# Patient Record
Sex: Female | Born: 1935
Health system: Southern US, Community
[De-identification: ages and names within clinical notes are randomized; demographics above are authoritative.]

## PROBLEM LIST (undated history)

## (undated) DIAGNOSIS — K589 Irritable bowel syndrome without diarrhea: Secondary | ICD-10-CM

## (undated) DIAGNOSIS — M1990 Unspecified osteoarthritis, unspecified site: Secondary | ICD-10-CM

## (undated) DIAGNOSIS — I2510 Atherosclerotic heart disease of native coronary artery without angina pectoris: Secondary | ICD-10-CM

## (undated) DIAGNOSIS — I10 Essential (primary) hypertension: Secondary | ICD-10-CM

## (undated) DIAGNOSIS — I639 Cerebral infarction, unspecified: Secondary | ICD-10-CM

## (undated) DIAGNOSIS — K219 Gastro-esophageal reflux disease without esophagitis: Secondary | ICD-10-CM

## (undated) DIAGNOSIS — E785 Hyperlipidemia, unspecified: Secondary | ICD-10-CM

## (undated) DIAGNOSIS — Z170 Estrogen receptor positive status [ER+]: Secondary | ICD-10-CM

## (undated) DIAGNOSIS — F32A Depression, unspecified: Secondary | ICD-10-CM

## (undated) DIAGNOSIS — C50412 Malignant neoplasm of upper-outer quadrant of left female breast: Secondary | ICD-10-CM

## (undated) DIAGNOSIS — R4182 Altered mental status, unspecified: Secondary | ICD-10-CM

## (undated) DIAGNOSIS — F329 Major depressive disorder, single episode, unspecified: Secondary | ICD-10-CM

## (undated) DIAGNOSIS — I252 Old myocardial infarction: Secondary | ICD-10-CM

## (undated) DIAGNOSIS — C44111 Basal cell carcinoma of skin of unspecified eyelid, including canthus: Secondary | ICD-10-CM

## (undated) DIAGNOSIS — D649 Anemia, unspecified: Secondary | ICD-10-CM

## (undated) DIAGNOSIS — K5790 Diverticulosis of intestine, part unspecified, without perforation or abscess without bleeding: Secondary | ICD-10-CM

## (undated) DIAGNOSIS — C50919 Malignant neoplasm of unspecified site of unspecified female breast: Secondary | ICD-10-CM

## (undated) HISTORY — PX: ABDOMINAL HYSTERECTOMY: SHX81

## (undated) HISTORY — DX: Old myocardial infarction: I25.2

## (undated) HISTORY — DX: Malignant neoplasm of upper-outer quadrant of left female breast: C50.412

## (undated) HISTORY — PX: APPENDECTOMY: SHX54

## (undated) HISTORY — PX: BACK SURGERY: SHX140

## (undated) HISTORY — DX: Malignant neoplasm of unspecified site of unspecified female breast: C50.919

## (undated) HISTORY — DX: Basal cell carcinoma of skin of unspecified eyelid, including canthus: C44.111

## (undated) HISTORY — PX: COLONOSCOPY: SHX5424

## (undated) HISTORY — DX: Anemia, unspecified: D64.9

## (undated) HISTORY — DX: Hyperlipidemia, unspecified: E78.5

## (undated) HISTORY — DX: Altered mental status, unspecified: R41.82

## (undated) HISTORY — DX: Estrogen receptor positive status (ER+): Z17.0

## (undated) HISTORY — PX: OTHER SURGICAL HISTORY: SHX169

---

## 1997-12-07 ENCOUNTER — Ambulatory Visit (HOSPITAL_COMMUNITY): Admission: RE | Admit: 1997-12-07 | Discharge: 1997-12-07 | Payer: Self-pay | Admitting: *Deleted

## 1998-06-28 ENCOUNTER — Encounter: Payer: Self-pay | Admitting: *Deleted

## 1998-06-28 ENCOUNTER — Ambulatory Visit (HOSPITAL_COMMUNITY): Admission: RE | Admit: 1998-06-28 | Discharge: 1998-06-28 | Payer: Self-pay | Admitting: *Deleted

## 1998-10-19 ENCOUNTER — Other Ambulatory Visit: Admission: RE | Admit: 1998-10-19 | Discharge: 1998-10-19 | Payer: Self-pay | Admitting: Family Medicine

## 1998-11-07 ENCOUNTER — Encounter: Admission: RE | Admit: 1998-11-07 | Discharge: 1999-02-05 | Payer: Self-pay | Admitting: Family Medicine

## 2000-01-08 ENCOUNTER — Encounter: Payer: Self-pay | Admitting: Neurological Surgery

## 2000-01-08 ENCOUNTER — Encounter: Admission: RE | Admit: 2000-01-08 | Discharge: 2000-01-08 | Payer: Self-pay | Admitting: Neurological Surgery

## 2000-04-23 ENCOUNTER — Other Ambulatory Visit: Admission: RE | Admit: 2000-04-23 | Discharge: 2000-04-23 | Payer: Self-pay | Admitting: *Deleted

## 2000-04-30 ENCOUNTER — Ambulatory Visit (HOSPITAL_COMMUNITY): Admission: RE | Admit: 2000-04-30 | Discharge: 2000-04-30 | Payer: Self-pay | Admitting: Gastroenterology

## 2000-04-30 ENCOUNTER — Encounter (INDEPENDENT_AMBULATORY_CARE_PROVIDER_SITE_OTHER): Payer: Self-pay

## 2000-08-21 ENCOUNTER — Emergency Department (HOSPITAL_COMMUNITY): Admission: EM | Admit: 2000-08-21 | Discharge: 2000-08-21 | Payer: Self-pay | Admitting: *Deleted

## 2001-08-09 ENCOUNTER — Encounter: Payer: Self-pay | Admitting: Emergency Medicine

## 2001-08-09 ENCOUNTER — Emergency Department (HOSPITAL_COMMUNITY): Admission: EM | Admit: 2001-08-09 | Discharge: 2001-08-09 | Payer: Self-pay | Admitting: Emergency Medicine

## 2001-08-15 ENCOUNTER — Encounter: Payer: Self-pay | Admitting: Gastroenterology

## 2001-08-15 ENCOUNTER — Ambulatory Visit (HOSPITAL_COMMUNITY): Admission: RE | Admit: 2001-08-15 | Discharge: 2001-08-15 | Payer: Self-pay | Admitting: Gastroenterology

## 2002-03-10 ENCOUNTER — Emergency Department (HOSPITAL_COMMUNITY): Admission: EM | Admit: 2002-03-10 | Discharge: 2002-03-10 | Payer: Self-pay | Admitting: Emergency Medicine

## 2002-11-19 ENCOUNTER — Emergency Department (HOSPITAL_COMMUNITY): Admission: EM | Admit: 2002-11-19 | Discharge: 2002-11-19 | Payer: Self-pay

## 2002-11-19 ENCOUNTER — Encounter: Payer: Self-pay | Admitting: Emergency Medicine

## 2003-12-24 ENCOUNTER — Emergency Department (HOSPITAL_COMMUNITY): Admission: EM | Admit: 2003-12-24 | Discharge: 2003-12-24 | Payer: Self-pay | Admitting: Emergency Medicine

## 2004-07-17 ENCOUNTER — Ambulatory Visit: Payer: Self-pay | Admitting: Physical Medicine & Rehabilitation

## 2004-07-17 ENCOUNTER — Inpatient Hospital Stay (HOSPITAL_COMMUNITY): Admission: EM | Admit: 2004-07-17 | Discharge: 2004-07-21 | Payer: Self-pay | Admitting: Emergency Medicine

## 2004-07-17 ENCOUNTER — Ambulatory Visit: Payer: Self-pay | Admitting: Cardiology

## 2004-07-21 ENCOUNTER — Inpatient Hospital Stay (HOSPITAL_COMMUNITY)
Admission: RE | Admit: 2004-07-21 | Discharge: 2004-07-27 | Payer: Self-pay | Admitting: Physical Medicine & Rehabilitation

## 2004-07-28 ENCOUNTER — Encounter
Admission: RE | Admit: 2004-07-28 | Discharge: 2004-10-26 | Payer: Self-pay | Admitting: Physical Medicine & Rehabilitation

## 2004-08-24 ENCOUNTER — Ambulatory Visit: Payer: Self-pay | Admitting: Cardiology

## 2004-08-25 ENCOUNTER — Encounter
Admission: RE | Admit: 2004-08-25 | Discharge: 2004-11-23 | Payer: Self-pay | Admitting: Physical Medicine & Rehabilitation

## 2004-08-29 ENCOUNTER — Ambulatory Visit: Payer: Self-pay | Admitting: Physical Medicine & Rehabilitation

## 2004-09-18 ENCOUNTER — Ambulatory Visit: Payer: Self-pay | Admitting: Cardiology

## 2004-11-29 ENCOUNTER — Ambulatory Visit: Payer: Self-pay | Admitting: Cardiology

## 2005-01-19 ENCOUNTER — Ambulatory Visit: Payer: Self-pay | Admitting: Cardiovascular Disease

## 2005-03-22 ENCOUNTER — Ambulatory Visit: Payer: Self-pay | Admitting: Cardiology

## 2005-05-02 ENCOUNTER — Ambulatory Visit: Payer: Self-pay

## 2005-09-12 ENCOUNTER — Ambulatory Visit: Payer: Self-pay | Admitting: Cardiology

## 2005-09-20 ENCOUNTER — Ambulatory Visit: Payer: Self-pay

## 2005-11-21 ENCOUNTER — Ambulatory Visit: Payer: Self-pay | Admitting: Cardiology

## 2005-11-23 ENCOUNTER — Ambulatory Visit: Payer: Self-pay | Admitting: Cardiology

## 2005-12-05 ENCOUNTER — Ambulatory Visit: Payer: Self-pay | Admitting: Cardiology

## 2006-03-22 ENCOUNTER — Emergency Department (HOSPITAL_COMMUNITY): Admission: EM | Admit: 2006-03-22 | Discharge: 2006-03-22 | Payer: Self-pay | Admitting: Emergency Medicine

## 2006-07-22 ENCOUNTER — Ambulatory Visit: Payer: Self-pay | Admitting: Internal Medicine

## 2006-09-27 ENCOUNTER — Ambulatory Visit: Payer: Self-pay | Admitting: Cardiology

## 2006-10-02 ENCOUNTER — Ambulatory Visit: Payer: Self-pay

## 2007-07-28 ENCOUNTER — Ambulatory Visit: Payer: Self-pay | Admitting: Cardiology

## 2007-07-31 ENCOUNTER — Ambulatory Visit: Payer: Self-pay

## 2007-09-08 ENCOUNTER — Ambulatory Visit: Payer: Self-pay | Admitting: Cardiology

## 2007-09-08 LAB — CONVERTED CEMR LAB
AST: 19 units/L (ref 0–37)
Albumin: 3.4 g/dL — ABNORMAL LOW (ref 3.5–5.2)
Alkaline Phosphatase: 99 units/L (ref 39–117)
HDL: 60.9 mg/dL (ref >39.0)
Triglycerides: 45 mg/dL (ref 0–149)
VLDL: 9 mg/dL (ref 0–40)

## 2008-05-12 ENCOUNTER — Ambulatory Visit: Payer: Self-pay | Admitting: Cardiology

## 2008-06-23 ENCOUNTER — Ambulatory Visit: Payer: Self-pay

## 2008-11-25 ENCOUNTER — Encounter (INDEPENDENT_AMBULATORY_CARE_PROVIDER_SITE_OTHER): Payer: Self-pay | Admitting: *Deleted

## 2008-12-27 ENCOUNTER — Telehealth: Payer: Self-pay | Admitting: Cardiology

## 2009-01-04 ENCOUNTER — Telehealth: Payer: Self-pay | Admitting: Cardiology

## 2009-01-04 ENCOUNTER — Telehealth (INDEPENDENT_AMBULATORY_CARE_PROVIDER_SITE_OTHER): Payer: Self-pay | Admitting: *Deleted

## 2009-01-12 ENCOUNTER — Ambulatory Visit (HOSPITAL_COMMUNITY): Admission: RE | Admit: 2009-01-12 | Discharge: 2009-01-12 | Payer: Self-pay | Admitting: Orthopedic Surgery

## 2009-03-07 DIAGNOSIS — I110 Hypertensive heart disease with heart failure: Secondary | ICD-10-CM | POA: Insufficient documentation

## 2009-03-07 DIAGNOSIS — E119 Type 2 diabetes mellitus without complications: Secondary | ICD-10-CM | POA: Insufficient documentation

## 2009-03-07 DIAGNOSIS — K589 Irritable bowel syndrome without diarrhea: Secondary | ICD-10-CM | POA: Insufficient documentation

## 2009-03-07 DIAGNOSIS — I6350 Cerebral infarction due to unspecified occlusion or stenosis of unspecified cerebral artery: Secondary | ICD-10-CM | POA: Insufficient documentation

## 2009-03-07 DIAGNOSIS — D649 Anemia, unspecified: Secondary | ICD-10-CM

## 2009-03-07 DIAGNOSIS — K219 Gastro-esophageal reflux disease without esophagitis: Secondary | ICD-10-CM | POA: Insufficient documentation

## 2009-03-07 DIAGNOSIS — E785 Hyperlipidemia, unspecified: Secondary | ICD-10-CM | POA: Insufficient documentation

## 2009-03-07 DIAGNOSIS — I25119 Atherosclerotic heart disease of native coronary artery with unspecified angina pectoris: Secondary | ICD-10-CM | POA: Insufficient documentation

## 2009-03-07 HISTORY — DX: Anemia, unspecified: D64.9

## 2009-03-08 ENCOUNTER — Ambulatory Visit: Payer: Self-pay | Admitting: Cardiology

## 2009-03-08 DIAGNOSIS — I679 Cerebrovascular disease, unspecified: Secondary | ICD-10-CM | POA: Insufficient documentation

## 2009-07-25 ENCOUNTER — Encounter: Admission: RE | Admit: 2009-07-25 | Discharge: 2009-07-25 | Payer: Self-pay | Admitting: Internal Medicine

## 2009-08-27 ENCOUNTER — Encounter: Admission: RE | Admit: 2009-08-27 | Discharge: 2009-08-27 | Payer: Self-pay | Admitting: Orthopedic Surgery

## 2009-09-01 ENCOUNTER — Telehealth: Payer: Self-pay | Admitting: Cardiology

## 2009-09-01 ENCOUNTER — Telehealth (INDEPENDENT_AMBULATORY_CARE_PROVIDER_SITE_OTHER): Payer: Self-pay | Admitting: *Deleted

## 2009-09-06 ENCOUNTER — Telehealth: Payer: Self-pay | Admitting: Cardiology

## 2009-09-14 ENCOUNTER — Inpatient Hospital Stay (HOSPITAL_COMMUNITY): Admission: RE | Admit: 2009-09-14 | Discharge: 2009-09-19 | Payer: Self-pay | Admitting: Orthopedic Surgery

## 2009-09-16 ENCOUNTER — Ambulatory Visit: Payer: Self-pay | Admitting: Physical Medicine & Rehabilitation

## 2009-11-14 ENCOUNTER — Encounter: Admission: RE | Admit: 2009-11-14 | Discharge: 2009-12-26 | Payer: Self-pay | Admitting: Orthopedic Surgery

## 2010-05-02 NOTE — Progress Notes (Signed)
   Phone Note From Other Clinic   Summary of Call: pt brought to the office a form from [facility] for clearence for pt to have lumbar decompresion and arthrodesis Ly15. will foward for dr Jens Som review Deliah Goody, RN  September 01, 2009 9:53 AM\par  Follow-up for Phone Call        ok for surgery Ferman Hamming, MD, [facility]  September 01, 2009 5:52 PM  note faxed Deliah Goody, RN  September 02, 2009 2:58 PM\par

## 2010-05-02 NOTE — Progress Notes (Signed)
Summary: Pt need surgical clearance faxed   Phone Note Call from Patient Call back at Home Phone [phone]   Caller: Patient Summary of Call: Pt need a surgical clearance fax to [facility] at [phone] Initial call taken by: Judie Grieve,  September 06, 2009 2:15 PM  Follow-up for Phone Call        Spoke with pt. regarding surgical clearance papers brought to office by pt. to be filled and sign by MD.On  EMR records, a note stating pt. was clear for surgery was send to a wrong fax #. We were unable to locate the actual clearance form from [facility] orthopedic at this time. I will fax the clear for surgery note to [phone]. Att: Ancil Linsey. Pt. aware. Follow-up by: Ollen Gross, RN, BSN,  September 06, 2009 3:48 PM

## 2010-05-02 NOTE — Progress Notes (Signed)
   Walk in Patient Form Recieved " Pt left form to be completed form [facility]" sent to [facility]  September 01, 2009 8:44 AM

## 2010-06-18 LAB — GLUCOSE, CAPILLARY
Glucose-Capillary: 100 mg/dL — ABNORMAL HIGH (ref 70–99)
Glucose-Capillary: 108 mg/dL — ABNORMAL HIGH (ref 70–99)
Glucose-Capillary: 121 mg/dL — ABNORMAL HIGH (ref 70–99)
Glucose-Capillary: 143 mg/dL — ABNORMAL HIGH (ref 70–99)
Glucose-Capillary: 150 mg/dL — ABNORMAL HIGH (ref 70–99)
Glucose-Capillary: 169 mg/dL — ABNORMAL HIGH (ref 70–99)
Glucose-Capillary: 196 mg/dL — ABNORMAL HIGH (ref 70–99)
Glucose-Capillary: 215 mg/dL — ABNORMAL HIGH (ref 70–99)
Glucose-Capillary: 228 mg/dL — ABNORMAL HIGH (ref 70–99)
Glucose-Capillary: 229 mg/dL — ABNORMAL HIGH (ref 70–99)
Glucose-Capillary: 256 mg/dL — ABNORMAL HIGH (ref 70–99)
Glucose-Capillary: 260 mg/dL — ABNORMAL HIGH (ref 70–99)
Glucose-Capillary: 261 mg/dL — ABNORMAL HIGH (ref 70–99)
Glucose-Capillary: 263 mg/dL — ABNORMAL HIGH (ref 70–99)
Glucose-Capillary: 329 mg/dL — ABNORMAL HIGH (ref 70–99)
Glucose-Capillary: 401 mg/dL — ABNORMAL HIGH (ref 70–99)
Glucose-Capillary: 408 mg/dL — ABNORMAL HIGH (ref 70–99)
Glucose-Capillary: 58 mg/dL — ABNORMAL LOW (ref 70–99)
Glucose-Capillary: 76 mg/dL (ref 70–99)
Glucose-Capillary: 91 mg/dL (ref 70–99)

## 2010-06-18 LAB — BASIC METABOLIC PANEL
Glucose, Bld: 181 mg/dL — ABNORMAL HIGH (ref 70–99)
Sodium: 132 mEq/L — ABNORMAL LOW (ref 135–145)

## 2010-06-18 LAB — URINE CULTURE: Special Requests: NEGATIVE

## 2010-06-18 LAB — URINALYSIS, ROUTINE W REFLEX MICROSCOPIC
Bilirubin Urine: NEGATIVE
Nitrite: NEGATIVE
Protein, ur: NEGATIVE mg/dL
Specific Gravity, Urine: 1.008 (ref 1.005–1.030)
Urobilinogen, UA: 0.2 mg/dL (ref 0.0–1.0)

## 2010-06-18 LAB — GLUCOSE, RANDOM: Glucose, Bld: 389 mg/dL — ABNORMAL HIGH (ref 70–99)

## 2010-06-18 LAB — URINE MICROSCOPIC-ADD ON

## 2010-06-18 LAB — CBC
MCHC: 33.8 g/dL (ref 30.0–36.0)
Platelets: 207 10*3/uL (ref 150–400)
RDW: 14.2 % (ref 11.5–15.5)

## 2010-06-18 LAB — HEMOGLOBIN A1C
Hgb A1c MFr Bld: 6.9 % — ABNORMAL HIGH (ref <5.7)
Mean Plasma Glucose: 151 mg/dL — ABNORMAL HIGH (ref <117)

## 2010-06-19 LAB — CBC
Hemoglobin: 11.1 g/dL — ABNORMAL LOW (ref 12.0–15.0)
MCHC: 33.4 g/dL (ref 30.0–36.0)
RBC: 3.68 MIL/uL — ABNORMAL LOW (ref 3.87–5.11)
WBC: 9.1 10*3/uL (ref 4.0–10.5)

## 2010-06-19 LAB — BASIC METABOLIC PANEL
CO2: 26 mEq/L (ref 19–32)
Calcium: 9.8 mg/dL (ref 8.4–10.5)
Creatinine, Ser: 1.06 mg/dL (ref 0.4–1.2)
GFR calc Af Amer: >60 mL/min (ref >60)
GFR calc non Af Amer: 51 mL/min — ABNORMAL LOW (ref >60)
Sodium: 140 mEq/L (ref 135–145)

## 2010-07-06 LAB — CBC
Hemoglobin: 11.3 g/dL — ABNORMAL LOW (ref 12.0–15.0)
Platelets: 310 10*3/uL (ref 150–400)
RDW: 15 % (ref 11.5–15.5)
WBC: 7.8 10*3/uL (ref 4.0–10.5)

## 2010-07-06 LAB — GLUCOSE, CAPILLARY
Glucose-Capillary: 196 mg/dL — ABNORMAL HIGH (ref 70–99)
Glucose-Capillary: 306 mg/dL — ABNORMAL HIGH (ref 70–99)

## 2010-07-06 LAB — URINALYSIS, ROUTINE W REFLEX MICROSCOPIC
Glucose, UA: NEGATIVE mg/dL
Hgb urine dipstick: NEGATIVE
Protein, ur: NEGATIVE mg/dL
Specific Gravity, Urine: 1.016 (ref 1.005–1.030)
Urobilinogen, UA: 0.2 mg/dL (ref 0.0–1.0)

## 2010-07-06 LAB — COMPREHENSIVE METABOLIC PANEL
ALT: 12 U/L (ref 0–35)
Albumin: 3.9 g/dL (ref 3.5–5.2)
Alkaline Phosphatase: 109 U/L (ref 39–117)
Chloride: 103 mEq/L (ref 96–112)
Potassium: 4.9 mEq/L (ref 3.5–5.1)
Sodium: 137 mEq/L (ref 135–145)
Total Bilirubin: 0.9 mg/dL (ref 0.3–1.2)
Total Protein: 6.5 g/dL (ref 6.0–8.3)

## 2010-07-06 LAB — DIFFERENTIAL
Basophils Absolute: 0 K/uL (ref 0.0–0.1)
Basophils Relative: 0 % (ref 0–1)
Eosinophils Absolute: 0.1 K/uL (ref 0.0–0.7)
Eosinophils Relative: 1 % (ref 0–5)
Lymphocytes Relative: 24 % (ref 12–46)
Lymphs Abs: 1.8 K/uL (ref 0.7–4.0)
Monocytes Absolute: 0.7 K/uL (ref 0.1–1.0)
Monocytes Relative: 9 % (ref 3–12)
Neutro Abs: 5.2 K/uL (ref 1.7–7.7)
Neutrophils Relative %: 66 % (ref 43–77)

## 2010-07-06 LAB — URINE MICROSCOPIC-ADD ON

## 2010-07-28 ENCOUNTER — Other Ambulatory Visit: Payer: Self-pay | Admitting: Cardiology

## 2010-08-15 NOTE — Assessment & Plan Note (Signed)
[facility]                            CARDIOLOGY OFFICE NOTE   NAME:Hata, JULIE-ANN VANMAANEN                      MRN:          [id number]  DATE:09/27/2006                            DOB:          1932/11/06    Ms. Termini returns for followup today. She has a history of coronary  disease by catheterization in April 2006. At that time she was found to  have an 80% LAD prior to the takeoff of the diagonal. The diagonal also  had an 80% lesion. The patient suffered a stroke at that time and we  have therefore treated her medically. Since I last saw her, she denies  any dyspnea, exertional chest pain, palpitations or syncope. She  continues to be unsteady on her feet from her previous stroke.   MEDICATIONS:  1. Zocor 40 mg p.o. daily.  2. Toprol 25 mg tablets 1/2 p.o. daily.  3. Temazepam 15 mg p.o. q.h.s.  4. Insulin.  5. Benicar 20 mg p.o. daily.  6. Aspirin 81 mg p.o. daily.   PHYSICAL EXAMINATION:  VITAL SIGNS:  Blood pressure of 142/67 and her  pulse was 82. She weighs 154 pounds.  HEENT:  Normal.  NECK:  Supple. There is a right carotid bruit.  CHEST:  Clear.  CARDIOVASCULAR:  Regular rate and rhythm.  ABDOMEN:  Benign.  EXTREMITIES:  Show no edema.   Her electrocardiogram  shows a sinus rhythm at a rate of 76. There are  no significant ST changes.   DIAGNOSES:  1. Coronary artery disease - we will continue with medical therapy as      she has had no chest pain or shortness of breath. I would prefer to      treat her medically if at all possible given the fact that she had      a previous stroke with her catheterization. We will continue with      her aspirin, ARB, beta blocker and statin.  2. History of cerebrovascular accident with previous catheterization.  3. Right carotid bruit - we will schedule carotid Dopplers to further      evaluate.  4. Diabetes mellitus - per Dr. Jacky Kindle.  5. Hyperlipidemia - we will have her most recent lipids,  liver and      BMET forwarded to [place] for our records.  6. Our goal LDL would be less than 70 given her history of coronary      disease.  7. Hypertension - her blood pressure is well controlled.  8. History of irritable bowel.  9. History of gastroesophageal reflux disease.   We will see her back in 8 months.     Madolyn Frieze Jens Som, MD, [facility]  Electronically Signed    BSC/MedQ  DD: 09/27/2006  DT: 09/27/2006  Job #: [id number]   cc:   Geoffry Paradise, M.D.

## 2010-08-15 NOTE — Assessment & Plan Note (Signed)
 [facility]ALTHC[facility]                            CARDIOLOGY OFFICE NOTE   NAME:Tripathi, AILEE PATES                      MRN:          [id number]  DATE:05/12/2008                            DOB:          09-09-1932    [name] is a very pleasant 77 year old female with history of coronary  disease.  She had a catheterization in April 2006 that showed an 80% LAD  prior to the takeoff of diagonal and the diagonal had an 80% lesion.  However, she also suffered a stroke at that time, we therefore treated  her medically.  I did perform a Myoview on her on July 31, 2007  secondary to chest pain.  This showed an ejection fraction of 81% and  normal perfusion.  Since I last saw her, she denies any chest pain,  palpitations, or syncope.  There is no pedal edema.  She does state that  she has some dyspnea on exertion, but there is no orthopnea, PND.   PRESENT MEDICATIONS:  1. Temazepam 15 mg p.o. nightly.  2. Insulin Levemir.  3. Aspirin 81 mg p.o. daily.  4. Lopressor 25 mg p.o. b.i.d.  5. Zocor 40 mg p.o. daily.   PHYSICAL EXAMINATION:  VITAL SIGNS:  Blood pressure of 141/69 and pulse  is 51.  She weighs 53 pounds.  HEENT:  Normal.  NECK:  Supple.  CHEST:  Clear.  CARDIOVASCULAR:  Regular rate.  ABDOMEN:  No tenderness.  EXTREMITIES:  No edema.   Her electrocardiogram shows a sinus rhythm at a rate of 52.  There are  no ST changes noted.   DIAGNOSES:  1. Coronary artery disease - Ms. Rigsbee is not having chest pain.  Her      Myoview in April 2009 was normal.  We will continue with medical      therapy including her aspirin and beta-blocker as well as a statin.      I would like to be conservative given her history of      cerebrovascular accident at the prior time of her previous      catheterization.  2. History of cerebrovascular accident with previous catheterization.  3. Mild cerebrovascular disease - she needs followup carotid Dopplers      and we will  arrange this.  4. Diabetes mellitus.  5. Hyperlipidemia - we will continue with her statin and we will have      her most recent lipids and liver forwarded to [place] from Dr. Lanell Matar      office.  6. Hypertension - blood pressure is adequately controlled on present      medications.  7. History of irritable bowel syndrome.  8. History of gastroesophageal reflux disease.   I will see her back in 9 months.     Madolyn Frieze Jens Som, MD, [facility]  Electronically Signed    BSC/MedQ  DD: 05/12/2008  DT: 05/12/2008  Job #: [id number]   cc:   Geoffry Paradise, M.D.

## 2010-08-15 NOTE — Assessment & Plan Note (Signed)
HEALTHCARE                            CARDIOLOGY OFFICE NOTE   NAME:Schickling, ARANDA BIHM                      MRN:          [id number]  DATE:07/28/2007                            DOB:          1932/11/06    [name] is a very pleasant female who has a history of coronary  disease.  She underwent cardiac catheterization in April 2006 and was  found to have an 80% LAD prior to the takeoff of the diagonal.  The  diagonal also had an 80% lesion.  However, she suffered a stroke at that  time and we have treated her medically since then.  Since I last saw her  she does occasionally have pain in her chest and her sides.  However,  this is not clearly exertional.  She does have it at night and states  that if she moves in a certain position, it feels better.  She denies  any dyspnea, palpitations or syncope.  There is no pedal edema.   MEDICATIONS:  1. Zocor 40 mg daily, but she has not taken this for some time.  2. She takes temazepam 15 mg daily at bedtime.  3. Insulin.  4. Lopressor 12.5 mg p.o. b.i.d.  5. Levemir 11 units b.i.d.  6. Aspirin 81 mg daily.   PHYSICAL EXAMINATION:  VITAL SIGNS:  Blood pressure of 160/75 and pulse  is 67.  She weighs 158 pounds.  HEENT:  Normal.  NECK:  Supple.  CHEST:  Clear.  CARDIOVASCULAR:  Regular rate.  ABDOMEN:  Bowel exam shows no tenderness.  EXTREMITIES:  No edema.   LABORATORY DATA:  Electrocardiogram shows sinus rhythm at a rate of 64.  No ST changes noted.   DIAGNOSES:  1. Atypical chest pain.  Mrs. Smiddy is having chest pain.  I am not      convinced it is cardiac and her electrocardiogram is normal.      However, she does have a history of 80% left anterior descending.      We will plan to proceed with a Myoview.  If it is low-risk, then we      will plan medical therapy, particularly in light of her history of      cerebrovascular accident at the time of her previous      catheterization.  If it is  high risk, then we would consider      repeating  her catheterization and intervene as needed.  2. Coronary artery disease.  She will continue on her aspirin and her      beta blocker.  I have asked to resume her Zocor and explained the      importance of this.  Will check lipids and liver in 6 weeks and      adjust as indicated.  3. History of cerebrovascular accident with previous catheterization.  4. Mild cerebrovascular use.  She needs followup carotid Dopplers in      July.  5. Diabetes mellitus managed per Dr. Jacky Kindle.  6. Hyperlipidemia.  Again we are resuming her Zocor and we will check  lipids and liver in 6 weeks.  7. Hypertension.  Blood pressure is elevated today.  I have asked her      to increase Lopressor to 25 mg p.o. b.i.d.  She will continue to      track this at home.  If her systolics are greater than 130, or      diastolics greater than 85, we will add additional medications.  8. History of irritable bowel syndrome.  9. History of gastric reflux disease.   We will see her back in 9 months or sooner if necessary.     Madolyn Frieze Jens Som, MD, [facility]  Electronically Signed    BSC/MedQ  DD: 07/28/2007  DT: 07/28/2007  Job #: [id number]   cc:   Geoffry Paradise, M.D.

## 2010-08-18 NOTE — Consult Note (Signed)
[name], MCCUISTION NO.:  [id number]   MEDICAL RECORD NO.:  [id number]          PATIENT TYPE:  INP   LOCATION:  2906                         FACILITY:  [facility]   PHYSICIAN:  Gustavus Messing. Orlin Hilding, M.D.DATE OF BIRTH:  [birth year]   DATE OF CONSULTATION:  07/18/2004  DATE OF DISCHARGE:                                   CONSULTATION   REASON FOR CONSULTATION:  Rule out TIA or stroke.   CHIEF COMPLAINT:  Transient right eye pain and persisting numbness in the  right facial area.   HISTORY OF PRESENT ILLNESS:  [name] is a 77 year old white woman with a  past medical history significant for diabetes and hypertension who had chest  pain early on the morning of April 17.  She was admitted and was found to be  hypertensive on admission.  She was taken to the catheterization lab this  morning.  During the procedure, there was some clotting of the sheath which  required some aspirations of that sheath. She was to have a stent placed  after that, but while they were working on declotting the sheath, she  described brief pain in the right eye described as a burning sensation which  lasted less than 1 minute.  It was not associated with any visual  disturbance at all.  This was followed by some facial numbness around the  right periorbital region, perhaps extending into the V2 and V3  distributions.  No other symptoms at that time.  No vision problems, no  weakness, no numbness of the arms or legs.   REVIEW OF SYSTEMS:  Negative for any headaches.  She has had severe motion  sickness.  No problems with bladder or bowel control.  She did choke on a  cracker while she was lying flat on her back but otherwise has not really  complained of dysphagia.   PAST MEDICAL HISTORY:  1.  Diabetes.  2.  Hypertension.  3.  Osteoarthritis.  4.  Peptic ulcer disease.   MEDICATIONS:  Aspirin, Lovenox, Pepcid, sliding scale insulin, Humulin  insulin, Lisinopril, Lopressor, ?Zocor,  Restoril, Tylenol, milk of magnesia,  and Zofran.  She was premedicated before the catheterization with prednisone  and Benadryl.   ALLERGIES:  CEPHALOSPORINS, KEFLEX, and CONTRAST MEDIA which is why she was  pretreated.   SOCIAL HISTORY:  Lives with her husband in [place].  She is an Engineer, manufacturing.  No alcohol or tobacco use.   FAMILY HISTORY:  Positive for melanoma and colon cancer.   OBJECTIVE PHYSICAL EXAMINATION:  VITAL SIGNS:  Pulse 76, blood pressure  171/55, respirations 12, O2 saturation 100%.  GENERAL:  She is a little bit drowsy having just been sedated with Zofran  for nausea but can be roused to be cooperative and appropriate.  Her  language is normal.  No evidence of an aphasia.  NEUROLOGIC:  On cranial nerve examination, pupils were equal and reactive  each eye.  She has full visual fields.  Extraocular movements are intact.  Facial sensation is currently normal to touch.  Facial motor exam, hearing  is intact.  Palate is symmetric tongue is midline.  On motor exam, there is  no drift or satelliting in either upper or lower extremity.  Normal  strength.  Reflexes are symmetric.  Finger-to-nose and heel-to-shin are  intact.  On sensory exam, the body is normal as it is in the face.   LABORATORY DATA AND OTHER STUDIES:  She does have an elevated glucose.  It  is 295now.  I gather it was a little bit higher than that while in the  catheterization lab.   IMPRESSION:  Transient right eye pain and right facial numbness, not likely  secondary to the same lesion, but she could have had a small embolic shower,  some kind of ischemic optic neuritis, although her symptoms are resolving,  and the eye pain was less than 1 minute in duration.   RECOMMENDATIONS:  Will get an MRI scan of the brain prior to stent placement  to rule out a stroke.      CAW/MEDQ  D:  07/18/2004  T:  07/18/2004  Job:  [id number]

## 2010-08-18 NOTE — Discharge Summary (Signed)
[name], [name] NO.:  [id number]   MEDICAL RECORD NO.:  [id number]          PATIENT TYPE:  IPS   LOCATION:  4033                         FACILITY:  [facility]   PHYSICIAN:  Greg Cutter, P.A. DATE OF BIRTH:  01-21-1933   DATE OF ADMISSION:  07/21/2004  DATE OF DISCHARGE:  07/27/2004                                 DISCHARGE SUMMARY   DISCHARGE DIAGNOSIS:  1.  Embolic right medullary right frontal infarction.  2.  Coronary artery disease treated medically.  3.  Diabetes mellitus type 2.  4.  Hypertension.   HISTORY OF PRESENT ILLNESS:  [name] is a 77 year old female with a  history of hypertension, diabetes mellitus, admitted April 14 with chest  pain radiating down the left arm.  Cardiac enzymes were done and were  negative.  The patient underwent cardiac catheterization April 18 with  development of right face numbness and right eye pain during the procedure  that resolved initially.  There was a question of TIA, therefore, MRI of the  brain done showing right medullary right frontal infarction.  Neurology was  consulted for input and felt this was embolic in nature and Plavix was  recommended.  The patient does have recurrent right facial numbness and  ataxia with mild weakness of the right side.  She was noted to have coronary  artery disease on cardiac catheterization and is currently being treated  medically for now for PCI in the future.  Carotid Dopplers were also done  showing no evidence of carotid artery stenosis.  Dr. Jacky Kindle was consulted  for elevated blood sugars and the patient was started on Lantus with dose  being increased.  She was noted to have some dysphagia initially and BE done  on April 20 showed some laryngeal penetration but no aspiration.  The  patient is currently tolerating a regular diet, thin liquids.  Blood sugars  are better controlled.  She was noted to have positive UA and was started on  antibiotics today.  Therapies  were initiated and the patient was noted to  have balance deficits and difficulty advancing left lower extremity.  She is  currently at [facility] assist for transfers, cueing for balance, total assist with  cueing to ambulate 100 feet and bilateral hand held assist, she is set up  for body care to min assist over all body care.   PAST MEDICAL HISTORY:  Significant for PUD, anemia, dyslipidemia.  Hysterectomy, appendectomy, tonsillectomy.  Diabetic retinopathy on the  left.  Hypertension, OA, diabetes mellitus type 2.   ALLERGIES:  Cephalosporins and contrast media.   FAMILY HISTORY:  Positive for cancer.   SOCIAL HISTORY:  The patient is married, works as an Print production planner, was  independent prior to admission.  She has positive tobacco use.  She does not  use any alcohol.   HOSPITAL COURSE:  [name] was admitted to rehab on July 21, 2004,  for inpatient therapies to consist of PT and OT and speech therapy.  Past  admission, aspirin and Plavix were continued for CVA prophylaxis.  Diabetes  was monitored on  a.c. and h.s. basis and patient maintained on Lantus and  sliding scale insulin.  She was treated with amoxicillin x 7 days for UTI.  Dr. Jacky Kindle has been following the patient and Lantus dose was slowly  increased.  At the time of discharge, blood sugars are ranging from 180s to  220s.  They have recommended the patient resume her home regimen of NPH  insulin past discharge.  The patient's blood pressures were monitored on a  b.i.d. basis and are relatively well controlled ranging from 120s to 140s  systolic and 50s to 70s diastolics.  The patient has had no recurrent  episode of angina during her stay.  She was noted to have some ulcers on her  lips that was treated with Acyclovir x 5 days.  During her stay in rehab,  the patient progressed to being at independent for bed mobility and  independent for transfers.  She is supervision for ambulating greater than  200 feet  without assistive device, for household and community activities in  community settings.  She continues with mild loss of balance and a tendency  to veer to the right but is aware of this deficit and is able to self  correct without any major loss of balance with functional or dynamic  activities.  The patient is at modified independent requiring set up for  dressing.  She is modified independent for toileting.  She is supervision  for simple meal preparations and home management task. Speech therapy  evaluation showed the patient's basic and high level comprehension intact,  basic high level expression intact, with speech clear without signs of  ataxia and dysarthria, no continued speech therapy was needed during her  stay.  Currently, the patient is at supervision level overall and the  patient's husband to provide this.  Outpatient PT  and OT has been set up at  [facility] to begin on July 28, 2004.  On July 27, 2004,  the patient is discharged to home.   DISCHARGE MEDICATIONS:  Coated aspirin 81 mg daily, Protonix 40 mg daily,  Toprol XL 25 mg daily, Amoxil 250 mg q.i.d. x 2 days, Restoril 50 mg q.h.s.,  Plavix 75 mg daily, Zocor 40 mg daily, Altace 2.5 mg daily, Claritin 10 mg  daily, home regimen insulin to resume.   DISCHARGE INSTRUCTIONS:  Activities:  As tolerated, no strenuous activities  for now.  Diet is diabetic diet.  Check blood sugars on t.i.d. to q.i.d.  basis.  No alcohol, no driving.  The patient is to follow up with Dr.  Riley Kill May 22 at 4:30, follow up with Dr. Riley Kill May 30 at 2:40, follow up  with Dr. Jacky Kindle in two weeks for routine check.      PP/MEDQ  D:  07/27/2004  T:  07/27/2004  Job:  [id number]   cc:   Geoffry Paradise, M.D.  [address]  [place]  [place] [address]  Fax: [phone]   Arturo Morton. Riley Kill, M.D. [facility]   Pramod P. Pearlean Brownie, MD  Fax: [phone]

## 2010-08-18 NOTE — H&P (Signed)
[name], [name] NO.:  [id number]   MEDICAL RECORD NO.:  [id number]          PATIENT TYPE:  IPS   LOCATION:  4033                         FACILITY:  [facility]   PHYSICIAN:  Ranelle Oyster, M.D.DATE OF BIRTH:  22-Mar-1933   DATE OF ADMISSION:  07/21/2004  DATE OF DISCHARGE:                                HISTORY & PHYSICAL   CHIEF COMPLAINT:  Right-sided weakness and dysarthria.   HISTORY OF PRESENT ILLNESS:  This is a 77 year old white female with chest  pain and left arm discomfort who was admitted on July 17, 2004 for workup.  Cardiac enzymes were negative. She underwent a cardiac catheterization,  after which she developed right facial numbness and right eye pain which  resolved initially. MRI of the brain was done which revealed right medullary  infarct that was felt to be the main source of her dysfunction. Some  incidental right frontal infarcts were noticed as well. The strokes were  felt to be embolic and induced by the catheterization. The patient had some  residual ataxia and numbness around the face. She still had some  difficulties with balance and self-care. The patient was admitted to rehab  today to address her functional issues.   The patient incidentally during her hospital stay has had difficulties with  elevated blood sugars. Her Lantus insulin was increased. The patient has had  dysphagia but is now on regular diet with thin liquids, using cues given to  her by speech pathology. The patient has had a positive urinalysis and was  started on antibiotics. A urine culture still is pending.   REVIEW OF SYSTEMS:  Positive for low-back pain and insomnia. Full review of  systems is in the written H&P.   PAST MEDICAL HISTORY:  Significant for peptic ulcer disease, anemia,  dyslipidemia, hysterectomy, appendectomy, tonsillectomy, diabetic  retinopathy in the left, OA, hypertension, and diabetes type 2.   FAMILY HISTORY:  Positive for  cancer.   SOCIAL HISTORY:  The patient is married, works as an Print production planner. She  does smoke tobacco. She does not drink.   MEDICATIONS PRIOR TO ADMISSION:  1.  Restoril 15 mg q.h.s.  2.  Darvocet p.r.n.  3.  Insulin NPH 6 units in the evening and 17 units in the morning.  4.  Calcium, vitamin E, and vitamin C.   LABORATORY DATA:  Includes hemoglobin 11.5. White count 19.0 as of July 19, 2004.   PHYSICAL EXAMINATION:  VITAL SIGNS:  Blood pressure is 142/76, pulse was 84,  respiratory rate is 16. The patient is afebrile.  GENERAL:  The patient is pleasant, in no acute distress.  HEENT:  Pupils equally round and reactive to light and accommodation.  Extraocular eye movements are intact. Oral mucosa is pink and moist without  irritation.  NECK:  Supple without JVD or adenopathy.  CHEST:  Clear to auscultation bilaterally without wheezes, rales, or  rhonchi.  HEART:  Regular rate and rhythm without murmur, rubs, or gallops.  ABDOMEN:  Soft, nontender, bowel sounds are positive.  NEUROLOGIC:  The patient has mild perioral numbness on the  right. She has a  mild central VII on the right side as well. Vision was intact with fair  acuity. The patient had a mild right pronator drift. Reflexes were 1+ to 2+  throughout. She had minimal changes in her tone on the right side. Strength  was minimally decreased at 5-/5 on the right side compared to 5/5 on the  left. Minimal dysarthria was noted. She had some mild ataxia of the right  upper extremity. Cognition was appropriate as well as memory and mood today.   ASSESSMENT AND PLAN:  1.  Embolic right medullary/right frontal stroke. Continue the patient on      aspirin and Plavix for CVA prophylaxis. The patient will begin inpatient      rehab to address mobility and functional deficits. Estimated length of      stay is 10-14 days. Goals are modified independent. Prognosis is good.  2.  Pain management with Darvocet.  3.  Deep venous  thrombosis prophylaxis with subcutaneous Lovenox.  4.  Diabetes type 2. Continue Lantus and sliding scale insulin for now.  5.  Coronary artery disease. Will manage on Altace and Toprol-XL. Control      lipids with Zocor. The patient is not a cardiac catheterization patient      at this point due to risk of stroke. She will be managed medically as      possible.  6.  Continue Amoxil for urinary tract infection. Await urine culture and      sensitivity.      ZTS/MEDQ  D:  07/21/2004  T:  07/22/2004  Job:  [id number]

## 2010-08-18 NOTE — Cardiovascular Report (Signed)
[name], [name] NO.:  [id number]   MEDICAL RECORD NO.:  [id number]          PATIENT TYPE:  INP   LOCATION:  2999                         FACILITY:  [facility]   PHYSICIAN:  Arturo Morton. Riley Kill, M.D. [facility] OF BIRTH:  1932/10/10   DATE OF PROCEDURE:  07/18/2004  DATE OF DISCHARGE:                              CARDIAC CATHETERIZATION   INDICATIONS:  [name] is a 77 year old well-known to me.  She has not been  a patient of mine, but I have taken care of her husband for many years.  She  was admitted by Dr. Jens Som because of some chest pain.  She has a long-  standing history of diabetes.  She developed recurrent episodes of chest  pain without any definite EKG changes.  She was seen by Dr. Jens Som and  cardiac catheterization recommended.  Because of a history of contrast  allergy she was premedicated.   PROCEDURE:  1.  Left heart catheterization.  2.  Selective coronary arteriography.  3.  Selective left ventriculography.   DESCRIPTION OF PROCEDURE:  The patient was brought to the catheterization  laboratory and prepped and draped in the usual fashion.  Through an anterior  puncture the right femoral artery was easily entered.  A 6-French sheath was  placed.  Following this, views of the left coronary artery were obtained.  We had to use a JL3.5 diagnostic catheter to engage the left main.  It was  an upward takeoff.  It was also slightly difficult to see the LAD with this.  Following this, RCA angiography was performed and then ventriculography was  performed in the RAO projection.  The findings demonstrated a high-grade  stenoses in the LAD and the diagonal.  We decided to take views with a JL3  guide.  Prior to this there was some small amount of clotting in the sheath  so the sheath was aspirated multiple times.  The sheath was then exchanged  for another 6-French sheath and there was no evidence of clot within the  sheath itself.  We placed a JL3.0  guiding catheter into the central aorta.  Shortly after this the patient noticed some pain in the right eye.  She also  noticed a mild degree of numbness in the right facial area.  She had normal  speech and normal strength bilaterally.  There were no evident motor  findings.  The eye discomfort resolved but some numbness remained in the  sheath.  Blood pressure went up to 220/80.  We rechecked her sugar and this  was found to be 446.  Subcutaneous insulin was given according to her  protocol which she knew well.  She was able to speak normally.  The  procedure was aborted at this point.  There was no evidence of clotting  whatsoever in the femoral sheath.  This was removed under direct manual  compression.  The patient continued to speak normally.  The eye discomfort  had resolved completely.  The numbness and tingling over the right facial  area persisted and she had some around the mouth which her husband said is  often associated with low sugar as did the patient.  The patient had been  pre treated with intravenous steroids for history of prior contrast  reaction.  The patient was taken to the holding area in satisfactory  condition with some continued numbness in the right facial area.  As noted  previously, there were absolutely no obvious motor findings.   HEMODYNAMIC DATA:  1.  Central aortic pressure 163/64, mean 105.  2.  Left ventricular pressure 158/14.   ANGIOGRAPHIC DATA:  1.  Ventriculography was done in the RAO projection.  Overall systolic      function was preserved.  No segmental abnormalities or contraction were      identified.  2.  The left main is very short.  3.  The LAD has about an 80% area of eccentric narrowing prior to the      takeoff of the diagonal.  The diagonal is relatively small in caliber      being just over a 2 mm vessel and has itself an 80% stenosis.  The      distal LAD has some segmental 30-40% narrowing in its mid portion after      the  diagonal takeoff.  4.  The circumflex consists of two marginal branches and appears free of      critical disease.  5.  The right coronary has about a 40% area of proximal narrowing      terminating as a posterior descending and posterolateral branch.   CONCLUSION:  1.  Well-preserved left ventricular function.  2.  Tandem lesions in the left anterior descending and diagonal as noted      above.  3.  Mild irregularity of the right coronary artery.  4.  Question catheterization-related transient ischemic attack.   DISPOSITION:  I have spoken with Dr. Pearlean Brownie.  We will get a neurology  consult.  The patient appears intact except for the right facial numbness.  We will try to normalize her sugar at the present time.  She will also be  treated with Plavix with a plan towards PCI later.      TDS/MEDQ  D:  07/18/2004  T:  07/18/2004  Job:  [id number]   cc:   CV Lab

## 2010-08-18 NOTE — Assessment & Plan Note (Signed)
MEDICAL RECORD NUMBER:  [id number]   DATE OF VISIT:  08/29/2004   HISTORY OF PRESENT ILLNESS:  [name] is here in follow up of her embolic  right medullary stroke.  She is on rehab from April 21-27, 2006.  She was  discharged to home with outpatient therapies.  She is being followed  medically by Dr. Geoffry Glover.  Blood pressure and coronary issues appear  to be stable since her follow up.  She states that her blood sugars have  been fairly well controlled.  She denies any problems with vision.  She  still denies vertigo or frank dizziness.  She is working hard with physical  therapy and is near completion there.  She feels that the exercises she has  done with PT have helped her balance and vertigo symptoms quite a bit.  The  patient denies any pain at this point.  She is sleeping well.  She is  anxious to get back to work.   REVIEW OF SYSTEMS:  The patient reports occasional numbness as noted in her  right face and somewhat in her left arm.  Denies any confusion or other  psychiatric problems.  Denies any constitutional, GU, GI, cardiorespiratory  complaints today.   SOCIAL HISTORY:  The patient is married.  She was working with the Careers information officer.   PHYSICAL EXAMINATION:  GENERAL APPEARANCE:  The patient is pleasant, alert  and oriented x3.  Affect is bright an appropriate.  Gait is steady with  normal pattern.  When we went heel to toe, she did have some mild problems  with loss of balance to the right side.  She was able to recovery nicely.  She had no problems with transfers.  Coordination, otherwise, was excellent.  Reflexes were 2+.  Sensation was a bit decreased over the left arm and right  face and 1 out of 2.  Motor:  She was 5/5 throughout.  Cranial nerves  intact.  HEART:  Regular rate and rhythm.  LUNGS:  Clear.  ABDOMEN:  Soft, nontender.  EXTREMITIES:  No cyanosis, clubbing or edema.   ASSESSMENT:  1.  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  2.  Coronary artery disease.  3.  Diabetes mellitus type 2.  4.  Hypertension.   PLAN:  1.  The patient has done extremely well to this point.  I recommend      completing physical therapy and moving on to the home exercise program.      I do not think she needs the full two weeks of therapy at this point.  2.  I have completed extensive paperwork regarding patient's short term      disability today.  3.  I allowed her to go back to work part time for one week and then up to a      full future.  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  4.  I gave the patient permission to drive today.  5.  I have nothing further to offer this patient today.  If she has any      other problems, I would be happy to see her in the future.  She will      follow up with Dr. Jacky Glover.      ZTS/MedQ  D:  08/29/2004 15:18:30  T:  08/30/2004 06:39:35  Job #:  [id number]

## 2010-08-18 NOTE — Assessment & Plan Note (Signed)
[facility]                             PULMONARY OFFICE NOTE   NAME:[name], [name]                      MRN:          [id number]  DATE:07/22/2006                            DOB:          14-Feb-1933    REASON FOR CONSULTATION:  Lung nodule.   HISTORY:  77 year old white female, never smoker, with a known lingular  nodule dating back to 2004 with no serial change but became abruptly ill  in March and underwent an evaluation at Urgent Care which included a CT  scan showing new multilobar densities along with no change in mass in  the lingula and left lower lobe.  When she became abruptly ill, she had  a dry cough with intermittent fever but no pleuritic pain, hemoptysis,  rigors, sore throat, myalgias, arthralgias, unintended weight loss, and  she was given 10 days of Zithromax and states that she is completely  back to baseline.  That is, she denies any ongoing fever, chills, or  cough, and feels normal.   The patient denies any history of rheumatoid arthritis or other  rheumatologic diseases, primary malignancy, unintended weight loss.   PAST MEDICAL HISTORY:  1. Hypertension.  2. CVA.  3. Diabetes.  4. Hysterectomy.   ALLERGIES:  KEFLEX.   MEDICATIONS:  Aspirin, Zocor, Toprol-XL, temazepam, NovoLog, and  Benicar.   SOCIAL HISTORY:  She has never smoked.  She is retired with no unusual  child, pet, or hobby exposure.   FAMILY HISTORY:  Significant for melanoma in her mother.  Otherwise,  negative for malignancy or rheumatologic disease or respiratory disease.   REVIEW OF SYSTEMS:  Taken in detail on the work sheet.  Negative, except  as outlined above.   PHYSICAL EXAMINATION:  GENERAL:  This is a pleasant elderly white female  who walks at a slow pace with a walker but is able to get up on exam  table but requires one person assist.  VITAL SIGNS:  She is afebrile with normal vital signs.  Weight 150  pounds.  HEENT:  Unremarkable.   Oropharynx clear.  The turbinates are normal.  Ear canals are clear.  NECK:  Supple with cervical adenopathy or tenderness.  LUNGS:  Lung fields were clear bilaterally to auscultation and  percussion with no generalized or localized wheezing or rhonchi.  CARDIAC:  Regular rhythm without murmur, gallop, or rub present.  ANTIBIOTICS:  Soft, benign, with no masses or organomegaly.  EXTREMITIES:  Warm without calf tenderness, cyanosis, clubbing, or  edema.   Hemoglobin saturation 98% on room air.   IMPRESSION:  Lingular mass.  Actually does not meet the criteria for a  mass at all but is a nodule that has been present for over four years  and at this point should be considered to be benign.  The other changes  seen on CT scan, which I believe were done because she had an abnormal  chest x-ray are what one would normally expect to see with a bronchial  pneumonia that has been treated appropriately.   Therefore, I recommended simply a followup chest x-ray to compare  to her  previous study that was done in January of 2008 using an apples to  apples comparison, and certainly this can be done in the context of a  regular followup office visit with Dr. Jacky Kindle.   Given the fact that she has multiple abnormalities on CT scan, one is  left with the impression that the over-sensitivity of the new generation  CT scanners, especially when used during an acute bronchial pneumonia,  will frequently lead to a overcall of parenchymal abnormalities that  then need more CT scans to follow to clearance.  I do not believe it  would be cost effective to do serial CT scans here, but rather just to  follow up chest x-ray unless there are new symptoms to address.   I tried to reassure the patient as much as I could and did not recommend  any pulmonary followup, but I would be happy to see her back here if Dr.  Jacky Kindle has any further questions.     Charlaine Dalton. Sherene Sires, MD, [facility]  Electronically Signed     MBW/MedQ  DD: 07/22/2006  DT: 07/23/2006  Job #: [id number]   cc:   Geoffry Paradise, M.D.

## 2011-04-02 ENCOUNTER — Other Ambulatory Visit: Payer: Self-pay | Admitting: Cardiology

## 2011-04-25 ENCOUNTER — Emergency Department (HOSPITAL_COMMUNITY): Payer: Medicare Other

## 2011-04-25 ENCOUNTER — Inpatient Hospital Stay (HOSPITAL_COMMUNITY)
Admission: EM | Admit: 2011-04-25 | Discharge: 2011-05-01 | DRG: 637 | Disposition: A | Discharge status: Home / Self Care | Payer: Medicare Other | Source: Ambulatory Visit | Attending: Internal Medicine | Admitting: Internal Medicine

## 2011-04-25 ENCOUNTER — Encounter (HOSPITAL_COMMUNITY): Payer: Self-pay | Admitting: Neurology

## 2011-04-25 ENCOUNTER — Other Ambulatory Visit: Payer: Self-pay

## 2011-04-25 DIAGNOSIS — E162 Hypoglycemia, unspecified: Secondary | ICD-10-CM

## 2011-04-25 DIAGNOSIS — I2510 Atherosclerotic heart disease of native coronary artery without angina pectoris: Secondary | ICD-10-CM | POA: Diagnosis present

## 2011-04-25 DIAGNOSIS — K219 Gastro-esophageal reflux disease without esophagitis: Secondary | ICD-10-CM | POA: Diagnosis present

## 2011-04-25 DIAGNOSIS — J9600 Acute respiratory failure, unspecified whether with hypoxia or hypercapnia: Secondary | ICD-10-CM

## 2011-04-25 DIAGNOSIS — M109 Gout, unspecified: Secondary | ICD-10-CM | POA: Diagnosis present

## 2011-04-25 DIAGNOSIS — E119 Type 2 diabetes mellitus without complications: Secondary | ICD-10-CM

## 2011-04-25 DIAGNOSIS — Z79899 Other long term (current) drug therapy: Secondary | ICD-10-CM

## 2011-04-25 DIAGNOSIS — R4182 Altered mental status, unspecified: Secondary | ICD-10-CM | POA: Diagnosis present

## 2011-04-25 DIAGNOSIS — Z794 Long term (current) use of insulin: Secondary | ICD-10-CM

## 2011-04-25 DIAGNOSIS — Z8673 Personal history of transient ischemic attack (TIA), and cerebral infarction without residual deficits: Secondary | ICD-10-CM

## 2011-04-25 DIAGNOSIS — K5900 Constipation, unspecified: Secondary | ICD-10-CM | POA: Diagnosis present

## 2011-04-25 DIAGNOSIS — F4320 Adjustment disorder, unspecified: Secondary | ICD-10-CM | POA: Diagnosis present

## 2011-04-25 DIAGNOSIS — J690 Pneumonitis due to inhalation of food and vomit: Secondary | ICD-10-CM | POA: Diagnosis present

## 2011-04-25 DIAGNOSIS — E1069 Type 1 diabetes mellitus with other specified complication: Principal | ICD-10-CM | POA: Diagnosis present

## 2011-04-25 DIAGNOSIS — I10 Essential (primary) hypertension: Secondary | ICD-10-CM | POA: Diagnosis present

## 2011-04-25 DIAGNOSIS — K2990 Gastroduodenitis, unspecified, without bleeding: Secondary | ICD-10-CM | POA: Diagnosis present

## 2011-04-25 DIAGNOSIS — E785 Hyperlipidemia, unspecified: Secondary | ICD-10-CM | POA: Diagnosis present

## 2011-04-25 DIAGNOSIS — K2970 Gastritis, unspecified, without bleeding: Secondary | ICD-10-CM | POA: Diagnosis present

## 2011-04-25 DIAGNOSIS — K5730 Diverticulosis of large intestine without perforation or abscess without bleeding: Secondary | ICD-10-CM | POA: Diagnosis present

## 2011-04-25 DIAGNOSIS — K589 Irritable bowel syndrome without diarrhea: Secondary | ICD-10-CM | POA: Diagnosis present

## 2011-04-25 HISTORY — DX: Irritable bowel syndrome, unspecified: K58.9

## 2011-04-25 HISTORY — DX: Diverticulosis of intestine, part unspecified, without perforation or abscess without bleeding: K57.90

## 2011-04-25 HISTORY — DX: Atherosclerotic heart disease of native coronary artery without angina pectoris: I25.10

## 2011-04-25 HISTORY — DX: Unspecified osteoarthritis, unspecified site: M19.90

## 2011-04-25 HISTORY — DX: Cerebral infarction, unspecified: I63.9

## 2011-04-25 HISTORY — DX: Essential (primary) hypertension: I10

## 2011-04-25 HISTORY — DX: Gastro-esophageal reflux disease without esophagitis: K21.9

## 2011-04-25 LAB — POCT I-STAT 3, ART BLOOD GAS (G3+)
Acid-base deficit: 2 mmol/L (ref 0.0–2.0)
Bicarbonate: 25.6 mEq/L — ABNORMAL HIGH (ref 20.0–24.0)
Bicarbonate: 25.6 mEq/L — ABNORMAL HIGH (ref 20.0–24.0)
pCO2 arterial: 43.2 mmHg (ref 35.0–45.0)
pCO2 arterial: 53.5 mmHg — ABNORMAL HIGH (ref 35.0–45.0)
pH, Arterial: 7.289 — ABNORMAL LOW (ref 7.350–7.400)
pH, Arterial: 7.357 (ref 7.350–7.400)
pO2, Arterial: 117 mmHg — ABNORMAL HIGH (ref 80.0–100.0)
pO2, Arterial: 155 mmHg — ABNORMAL HIGH (ref 80.0–100.0)

## 2011-04-25 LAB — URINE CULTURE
Colony Count: NO GROWTH
Culture  Setup Time: 201301231858

## 2011-04-25 LAB — CBC
MCHC: 32.7 g/dL (ref 30.0–36.0)
Platelets: 390 10*3/uL (ref 150–400)
RDW: 13.3 % (ref 11.5–15.5)
WBC: 9.6 10*3/uL (ref 4.0–10.5)

## 2011-04-25 LAB — COMPREHENSIVE METABOLIC PANEL
ALT: 12 U/L (ref 0–35)
AST: 23 U/L (ref 0–37)
Albumin: 4 g/dL (ref 3.5–5.2)
Chloride: 107 mEq/L (ref 96–112)
Creatinine, Ser: 0.87 mg/dL (ref 0.50–1.10)
Potassium: 3.3 mEq/L — ABNORMAL LOW (ref 3.5–5.1)
Sodium: 142 mEq/L (ref 135–145)
Total Bilirubin: 0.3 mg/dL (ref 0.3–1.2)

## 2011-04-25 LAB — DIFFERENTIAL
Basophils Absolute: 0.1 10*3/uL (ref 0.0–0.1)
Basophils Relative: 1 % (ref 0–1)
Lymphocytes Relative: 28 % (ref 12–46)
Monocytes Absolute: 0.7 10*3/uL (ref 0.1–1.0)
Neutro Abs: 6.1 10*3/uL (ref 1.7–7.7)
Neutrophils Relative %: 64 % (ref 43–77)

## 2011-04-25 LAB — GLUCOSE, CAPILLARY
Glucose-Capillary: 200 mg/dL — ABNORMAL HIGH (ref 70–99)
Glucose-Capillary: 24 mg/dL — CL (ref 70–99)
Glucose-Capillary: 244 mg/dL — ABNORMAL HIGH (ref 70–99)

## 2011-04-25 LAB — URINALYSIS, ROUTINE W REFLEX MICROSCOPIC
Bilirubin Urine: NEGATIVE
Glucose, UA: 100 mg/dL — AB
Hgb urine dipstick: NEGATIVE
Protein, ur: NEGATIVE mg/dL

## 2011-04-25 LAB — CARDIAC PANEL(CRET KIN+CKTOT+MB+TROPI)
CK, MB: 3 ng/mL (ref 0.3–4.0)
Relative Index: INVALID (ref 0.0–2.5)
Total CK: 89 U/L (ref 7–177)

## 2011-04-25 LAB — PROTIME-INR: Prothrombin Time: 13.8 seconds (ref 11.6–15.2)

## 2011-04-25 MED ORDER — LABETALOL HCL 5 MG/ML IV SOLN
20.0000 mg | INTRAVENOUS | Status: DC | PRN
  Administered 2011-04-25 – 2011-04-26 (×4): 20 mg via INTRAVENOUS
  Filled 2011-04-25 (×4): qty 4

## 2011-04-25 MED ORDER — ONDANSETRON HCL 4 MG/2ML IJ SOLN
4.0000 mg | Freq: Four times a day (QID) | INTRAMUSCULAR | Status: DC | PRN
  Administered 2011-04-25 – 2011-04-27 (×5): 4 mg via INTRAVENOUS
  Filled 2011-04-25 (×5): qty 2

## 2011-04-25 MED ORDER — ONDANSETRON HCL 4 MG PO TABS: 4.0000 mg | ORAL_TABLET | Freq: Four times a day (QID) | ORAL | Status: DC | PRN

## 2011-04-25 MED ORDER — DEXTROSE-NACL 5-0.45 % IV SOLN
INTRAVENOUS | Status: DC
  Administered 2011-04-26: via INTRAVENOUS

## 2011-04-25 MED ORDER — ACETAMINOPHEN 325 MG PO TABS
650.0000 mg | ORAL_TABLET | Freq: Four times a day (QID) | ORAL | Status: DC | PRN
  Administered 2011-04-25 – 2011-04-30 (×4): 650 mg via ORAL
  Filled 2011-04-25 (×4): qty 2

## 2011-04-25 MED ORDER — METOPROLOL TARTRATE 50 MG PO TABS
50.0000 mg | ORAL_TABLET | Freq: Two times a day (BID) | ORAL | Status: DC
  Administered 2011-04-26 – 2011-05-01 (×12): 50 mg via ORAL
  Filled 2011-04-25 (×15): qty 1

## 2011-04-25 MED ORDER — METHOCARBAMOL 500 MG PO TABS
500.0000 mg | ORAL_TABLET | Freq: Three times a day (TID) | ORAL | Status: DC | PRN
Start: 2011-04-25 — End: 2011-04-26
  Filled 2011-04-25: qty 1

## 2011-04-25 MED ORDER — HYDROCODONE-ACETAMINOPHEN 10-325 MG PO TABS
1.0000 | ORAL_TABLET | Freq: Four times a day (QID) | ORAL | Status: DC | PRN
  Administered 2011-04-28 – 2011-04-29 (×3): 1 via ORAL
  Filled 2011-04-25 (×4): qty 1

## 2011-04-25 MED ORDER — SODIUM CHLORIDE 0.9 % IV SOLN
INTRAVENOUS | Status: DC
  Administered 2011-04-26 – 2011-04-30 (×6): via INTRAVENOUS

## 2011-04-25 MED ORDER — PANTOPRAZOLE SODIUM 40 MG PO TBEC
40.0000 mg | DELAYED_RELEASE_TABLET | Freq: Every day | ORAL | Status: DC
  Administered 2011-04-26 – 2011-04-28 (×3): 40 mg via ORAL
  Filled 2011-04-25 (×3): qty 1

## 2011-04-25 MED ORDER — BRIMONIDINE TARTRATE-TIMOLOL 0.2-0.5 % OP SOLN: 1.0000 [drp] | Freq: Two times a day (BID) | OPHTHALMIC | Status: DC

## 2011-04-25 MED ORDER — BRIMONIDINE TARTRATE 0.2 % OP SOLN
1.0000 [drp] | Freq: Two times a day (BID) | OPHTHALMIC | Status: DC
  Administered 2011-04-26 – 2011-05-01 (×12): 1 [drp] via OPHTHALMIC
  Filled 2011-04-25: qty 5

## 2011-04-25 MED ORDER — DEXTROSE 50 % IV SOLN
50.0000 g | Freq: Once | INTRAVENOUS | Status: AC
  Administered 2011-04-25: 50 g via INTRAVENOUS

## 2011-04-25 MED ORDER — TIMOLOL MALEATE 0.5 % OP SOLN
1.0000 [drp] | Freq: Two times a day (BID) | OPHTHALMIC | Status: DC
  Administered 2011-04-26 – 2011-05-01 (×12): 1 [drp] via OPHTHALMIC
  Filled 2011-04-25: qty 5

## 2011-04-25 MED ORDER — SIMVASTATIN 40 MG PO TABS
40.0000 mg | ORAL_TABLET | Freq: Every evening | ORAL | Status: DC
  Administered 2011-04-27 – 2011-04-30 (×4): 40 mg via ORAL
  Filled 2011-04-25 (×6): qty 1

## 2011-04-25 MED ORDER — LORAZEPAM 0.5 MG PO TABS
0.5000 mg | ORAL_TABLET | Freq: Three times a day (TID) | ORAL | Status: DC | PRN
  Administered 2011-04-25: 0.5 mg via ORAL
  Filled 2011-04-25: qty 1

## 2011-04-25 MED ORDER — DEXTROSE 50 % IV SOLN
INTRAVENOUS | Status: AC
  Filled 2011-04-25: qty 100

## 2011-04-25 MED ORDER — PROMETHAZINE HCL 25 MG/ML IJ SOLN
12.5000 mg | Freq: Four times a day (QID) | INTRAMUSCULAR | Status: DC | PRN
  Administered 2011-04-26: 12.5 mg via INTRAVENOUS
  Filled 2011-04-25: qty 1

## 2011-04-25 MED ORDER — ENOXAPARIN SODIUM 40 MG/0.4ML ~~LOC~~ SOLN
40.0000 mg | SUBCUTANEOUS | Status: DC
  Administered 2011-04-26 – 2011-05-01 (×6): 40 mg via SUBCUTANEOUS
  Filled 2011-04-25 (×6): qty 0.4

## 2011-04-25 NOTE — ED Provider Notes (Signed)
History     CSN: [id number]  Arrival date & time 04/25/11  1716   None     Chief Complaint  Patient presents with  . Respiratory Distress    (Consider location/radiation/quality/duration/timing/severity/associated sxs/prior treatment) HPI Comments: EMS was called to the 78 rolled woman's home for syncope. When paramedics got there, the patient was unresponsive, but struggling. They placed her on C-PAP. The paramedics measured her blood pressure at 168/98. The paramedics felt that the patient had fluid on her lungs. They think that she is diabetic. Her husband, who is at the house at the same time, had a cardiac arrest, and so the team that initially at come to see this patient deferred themselves to the husband's situation, and a second EMS team came to bring the patient to the hospital. Review of prior records shows that the patient is diabetic, with a history of coronary artery disease, esophageal reflux, gout, and a prior CVA.  Patient is a 78 y.o. female presenting with shortness of breath and syncope.  Shortness of Breath  Associated symptoms include shortness of breath.  Loss of Consciousness Associated symptoms include shortness of breath.    Past Medical History  Diagnosis Date  . Diabetes mellitus   . Hypertension     History reviewed. No pertinent past surgical history.  No family history on file.  History  Substance Use Topics  . Smoking status: Not on file  . Smokeless tobacco: Not on file  . Alcohol Use:     OB History    Grav Para Term Preterm Abortions TAB SAB Ect Mult Living                  Review of Systems  Unable to perform ROS: Mental status change  Respiratory: Positive for shortness of breath.   Cardiovascular: Positive for syncope.    Allergies  Review of patient's allergies indicates no known allergies.  Home Medications   Current Outpatient Rx  Name Route Sig Dispense Refill  . METOPROLOL TARTRATE 50 MG PO TABS Oral Take 50 mg by  mouth 2 (two) times daily.    Marland Kitchen SIMVASTATIN 40 MG PO TABS Oral Take 40 mg by mouth every evening.      BP 133/97  Pulse 50  Resp 20  SpO2 100%  Physical Exam  Nursing note and vitals reviewed. Constitutional:       Elderly woman, receiving CPAP. She lies peacefully on the stretcher. She opens her eyes to sternal rub.  HENT:  Head: Normocephalic and atraumatic.  Right Ear: External ear normal.  Left Ear: External ear normal.  Mouth/Throat: Oropharynx is clear and moist.  Eyes: Conjunctivae and EOM are normal. Pupils are equal, round, and reactive to light.  Neck: Normal range of motion. Neck supple.       No jugular venous distention, no obvious carotid bruit.  Cardiovascular:       She has a bradycardia in the 50s. Her heart sounds are normal. Her breath sounds to a limited exam are clear.  Abdominal: Soft. She exhibits no distension. There is no tenderness.  Musculoskeletal: She exhibits no edema.  Neurological:       Patient is minimally arousable to sternal rub. She moves her arms and legs.  Skin:       Her skin is cool and sweaty.  Psychiatric:       Unable to assess.    ED Course  CRITICAL CARE Performed by: Osvaldo Human Authorized by: Osvaldo Human  Total critical care time: 60 minutes Critical care was necessary to treat or prevent imminent or life-threatening deterioration of the following conditions: endocrine crisis and CNS failure or compromise (Evaluation and treatment of altered mental status and hypoglycemia of 24.). Critical care was time spent personally by me on the following activities: development of treatment plan with patient or surrogate, discussions with consultants, discussions with primary provider, evaluation of patient's response to treatment, examination of patient, obtaining history from patient or surrogate, ordering and performing treatments and interventions, ordering and review of laboratory studies, ordering and review of radiographic  studies, re-evaluation of patient's condition and review of old charts.   (including critical care time)   Labs Reviewed  CBC  DIFFERENTIAL  COMPREHENSIVE METABOLIC PANEL  URINALYSIS, ROUTINE W REFLEX MICROSCOPIC  CARDIAC PANEL(CRET KIN+CKTOT+MB+TROPI)  PROTIME-INR  APTT  D-DIMER, QUANTITATIVE  PRO B NATRIURETIC PEPTIDE  URINE CULTURE  BLOOD GAS, ARTERIAL   No results found.   5:37 PM Patient was seen STAT on arrival. She had physical examination. Laboratory testing was ordered. Old charts were reviewed.  5:48 PM Pt's rectal temp was 92.  Bair hugger ordered.  Temp foley ordered.  6:03 PM CBG is 24.  2 amps D50W ordered.  6:08 PM ABG:  PH 7.357, pCO2 43.2, pO2 117.  Respiratory tech reduced her FIO2 slightly.    6:12 PM  Date: 04/25/2011  Rate: 48  Rhythm: sinus bradycardia  QRS Axis: normal  Intervals: normal  ST/T Wave abnormalities: normal  Conduction Disutrbances:none  Narrative Interpretation: Abnormal EKG with sinus bradycardia.  Old EKG Reviewed: changes noted--rate was 71 on prior tracing of 09/13/2009.  6:25 PM Pt more alert, opens eyes to voice, grasps fingers with either hand on command.  CT of head ordered.   7:09 PM Case discussed with Dr. Delton Coombes of Critical Care.  They will see pt.  He says that she is a no CPR patient.     7:37 PM Pt seen by PCCM, who note that pt is now able to converse, no longer needs CPAP.  Advised to request her internists at [facility] to admit her.  7:51 PM Dr. Rodrigo Ran contacted, covering for Dr. Jacky Kindle, and he will see and admit her.      1. Altered mental status   2. Hypoglycemia             Carleene Cooper III, MD 04/25/11 2108

## 2011-04-25 NOTE — ED Notes (Signed)
Received call from lab about critical glucose value of 30. Dr Ignacia Palma notified.

## 2011-04-25 NOTE — ED Notes (Signed)
Per ems- ems called out for syncopal episode. When ems arrived, pt husband had witness cardiac arrest. Hx unclear at this time. Pt came in on CPap, is being transferred to bipap. Pt lethargic, responsive stimuli. Ems reporting pt combative initially became increasingly lethargic upon arrival. EMS reporting lung sounds congested. CBG 61. BP 168/98. Pt being placed on monitor, obtaining IV access

## 2011-04-25 NOTE — ED Notes (Signed)
Pt transported to CT with RN and RT

## 2011-04-25 NOTE — ED Notes (Signed)
Pt was taken off of the bair hugger at 2215.

## 2011-04-25 NOTE — ED Notes (Signed)
Pt cbg noted to be 24. EDP notified.

## 2011-04-25 NOTE — ED Notes (Signed)
Pt opening eyes to voice. Following commands of squeezing hands. Bipap still in process

## 2011-04-25 NOTE — Consult Note (Signed)
PULMONARY/CCM NOTE  Ms [name] was brought to the ED emergently after apparent syncope at home. PCCM has been consulted to evaluate and that evaluation is pending. Unfortunately her husband is also critically ill and is under our care. In the unusual circumstances of two family members critically ill in the ED at the same time, I felt it appropriate to broach the subject of code status of both patients with family, even though I have not formally evaluated [name]. The daughter, [name], and patients' grandson were present in the ED. In addition to confirming that Mr Eckhart would not want extraordinary support (please see his admission note), [name] is very clear that her mother would not want intubation, MV, life support in any form including CPR, pressors. I will change her code status in the orders and inform Dr Ignacia Palma in the ED about the conversation. CCM will evaluate her and make recommendations on her further care this evening.   Levy Pupa, MD, PhD 04/25/2011, 7:02 PM [facility] [phone] or if no answer [phone]

## 2011-04-25 NOTE — ED Notes (Signed)
EDP to bedside to evaluate patient

## 2011-04-25 NOTE — H&P (Addendum)
[name] is an 78 y.o. female.   Chief Complaint: severe hypoglycemia HPI: [name] is a pleasant 78 yo female with history as noted below.  She does occasionally have some low sugars in the afternoon time.  However, she can always feel it coming on.  Today her husband found her unresponsive and called the paramedics.  Unfortunately he then had an MI and he is now in the ICU here.  She was also brought to ER and found to be unresponsive with a  cbg of 26. She was treated with two doses of d50.  CCM was consulted but she has neurologically improved.  Now she has no pain but feels a bit nauseated and has sbp of 200. She will be admitted to stepdown for further care.  Past Medical History  Diagnosis Date  . Diabetes mellitus   . Hypertension   . Coronary artery disease   . Gout   . GERD (gastroesophageal reflux disease)   . Stroke   . Arthritis   . Diverticulosis   . Gout   . Hyperlipemia   . IBS (irritable bowel syndrome)     Past Surgical History  Procedure Date  . Back surgery   . Appendectomy   . Abdominal hysterectomy   . Cataract surgery     History reviewed. No pertinent family history. Social History:  does not have a smoking history on file. She does not have any smokeless tobacco history on file. Her alcohol and drug histories not on file.  Married to [name]. Daughter [name] at bedside.  Allergies: No Known Allergies  Medications Prior to Admission  Medication Dose Route Frequency Provider Last Rate Last Dose  . dextrose 50 % solution 50 g  50 g Intravenous Once Carleene Cooper III, MD   50 g at 04/25/11 1804   No current outpatient prescriptions on file as of 04/25/2011.  Home meds: Alprazolam 0.25 mg rarely Asa 81 mg daily levemir 6 units subq bid novolog ssi restoril 15 mg qhs zocor 80 mg qhs     Results for orders placed during the hospital encounter of 04/25/11 (from the past 48 hour(s))  CBC     Status: Abnormal   Collection Time   04/25/11  5:21 PM      Component Value Range Comment   WBC 9.6  4.0 - 10.5 (K/uL)    RBC 3.91  3.87 - 5.11 (MIL/uL)    Hemoglobin 11.5 (*) 12.0 - 15.0 (g/dL)    HCT 11.9 (*) 14.7 - 46.0 (%)    MCV 90.0  78.0 - 100.0 (fL)    MCH 29.4  26.0 - 34.0 (pg)    MCHC 32.7  30.0 - 36.0 (g/dL)    RDW 82.9  56.2 - 13.0 (%)    Platelets 390  150 - 400 (K/uL)   DIFFERENTIAL     Status: Normal   Collection Time   04/25/11  5:21 PM      Component Value Range Comment   Neutrophils Relative 64  43 - 77 (%)    Neutro Abs 6.1  1.7 - 7.7 (K/uL)    Lymphocytes Relative 28  12 - 46 (%)    Lymphs Abs 2.7  0.7 - 4.0 (K/uL)    Monocytes Relative 7  3 - 12 (%)    Monocytes Absolute 0.7  0.1 - 1.0 (K/uL)    Eosinophils Relative 1  0 - 5 (%)    Eosinophils Absolute 0.1  0.0 - 0.7 (K/uL)  Basophils Relative 1  0 - 1 (%)    Basophils Absolute 0.1  0.0 - 0.1 (K/uL)   COMPREHENSIVE METABOLIC PANEL     Status: Abnormal   Collection Time   04/25/11  5:21 PM      Component Value Range Comment   Sodium 142  135 - 145 (mEq/L)    Potassium 3.3 (*) 3.5 - 5.1 (mEq/L)    Chloride 107  96 - 112 (mEq/L)    CO2 23  19 - 32 (mEq/L)    Glucose, Bld 30 (*) 70 - 99 (mg/dL)    BUN 19  6 - 23 (mg/dL)    Creatinine, Ser 1.19  0.50 - 1.10 (mg/dL)    Calcium 9.9  8.4 - 10.5 (mg/dL)    Total Protein 7.5  6.0 - 8.3 (g/dL)    Albumin 4.0  3.5 - 5.2 (g/dL)    AST 23  0 - 37 (U/L)    ALT 12  0 - 35 (U/L)    Alkaline Phosphatase 112  39 - 117 (U/L)    Total Bilirubin 0.3  0.3 - 1.2 (mg/dL)    GFR calc non Af Amer 63 (*) >90 (mL/min)    GFR calc Af Amer 73 (*) >90 (mL/min)   CARDIAC PANEL(CRET KIN+CKTOT+MB+TROPI)     Status: Normal   Collection Time   04/25/11  5:21 PM      Component Value Range Comment   Total CK 89  7 - 177 (U/L)    CK, MB 3.0  0.3 - 4.0 (ng/mL)    Troponin I <0.30  <0.30 (ng/mL)    Relative Index RELATIVE INDEX IS INVALID  0.0 - 2.5    PROTIME-INR     Status: Normal   Collection Time   04/25/11  5:21 PM      Component Value  Range Comment   Prothrombin Time 13.8  11.6 - 15.2 (seconds)    INR 1.04  0.00 - 1.49    APTT     Status: Normal   Collection Time   04/25/11  5:21 PM      Component Value Range Comment   aPTT 31  24 - 37 (seconds)   D-DIMER, QUANTITATIVE     Status: Abnormal   Collection Time   04/25/11  5:21 PM      Component Value Range Comment   D-Dimer, Quant 0.57 (*) 0.00 - 0.48 (ug/mL-FEU)   PRO B NATRIURETIC PEPTIDE     Status: Abnormal   Collection Time   04/25/11  5:32 PM      Component Value Range Comment   Pro B Natriuretic peptide (BNP) 996.8 (*) 0 - 450 (pg/mL)   POCT I-STAT 3, BLOOD GAS (G3+)     Status: Abnormal   Collection Time   04/25/11  5:51 PM      Component Value Range Comment   pH, Arterial 7.289 (*) 7.350 - 7.400     pCO2 arterial 53.5 (*) 35.0 - 45.0 (mmHg)    pO2, Arterial 155.0 (*) 80.0 - 100.0 (mmHg)    Bicarbonate 25.6 (*) 20.0 - 24.0 (mEq/L)    TCO2 27  0 - 100 (mmol/L)    O2 Saturation 99.0      Acid-base deficit 2.0  0.0 - 2.0 (mmol/L)    Patient temperature 98.7 F      Collection site RADIAL, ALLEN'S TEST ACCEPTABLE      Drawn by Operator      Sample type ARTERIAL  POCT I-STAT 3, BLOOD GAS (G3+)     Status: Abnormal   Collection Time   04/25/11  6:02 PM      Component Value Range Comment   pH, Arterial 7.357  7.350 - 7.400     pCO2 arterial 43.2  35.0 - 45.0 (mmHg)    pO2, Arterial 117.0 (*) 80.0 - 100.0 (mmHg)    Bicarbonate 25.6 (*) 20.0 - 24.0 (mEq/L)    TCO2 27  0 - 100 (mmol/L)    O2 Saturation 99.0      Acid-base deficit 2.0  0.0 - 2.0 (mmol/L)    Patient temperature 90.0 F      Collection site RADIAL, ALLEN'S TEST ACCEPTABLE      Drawn by Operator      Sample type ARTERIAL     GLUCOSE, CAPILLARY     Status: Abnormal   Collection Time   04/25/11  6:02 PM      Component Value Range Comment   Glucose-Capillary 24 (*) 70 - 99 (mg/dL)    Comment 1 Documented in Chart      Comment 2 MD NOTIFIED     URINALYSIS, ROUTINE W REFLEX MICROSCOPIC      Status: Abnormal   Collection Time   04/25/11  6:05 PM      Component Value Range Comment   Color, Urine YELLOW  YELLOW     APPearance CLEAR  CLEAR     Specific Gravity, Urine 1.005  1.005 - 1.030     pH 6.0  5.0 - 8.0     Glucose, UA 100 (*) NEGATIVE (mg/dL)    Hgb urine dipstick NEGATIVE  NEGATIVE     Bilirubin Urine NEGATIVE  NEGATIVE     Ketones, ur NEGATIVE  NEGATIVE (mg/dL)    Protein, ur NEGATIVE  NEGATIVE (mg/dL)    Urobilinogen, UA 0.2  0.0 - 1.0 (mg/dL)    Nitrite NEGATIVE  NEGATIVE     Leukocytes, UA NEGATIVE  NEGATIVE  MICROSCOPIC NOT DONE ON URINES WITH NEGATIVE PROTEIN, BLOOD, LEUKOCYTES, NITRITE, OR GLUCOSE <1000 mg/dL.  GLUCOSE, CAPILLARY     Status: Abnormal   Collection Time   04/25/11  6:20 PM      Component Value Range Comment   Glucose-Capillary 244 (*) 70 - 99 (mg/dL)   GLUCOSE, CAPILLARY     Status: Abnormal   Collection Time   04/25/11  7:11 PM      Component Value Range Comment   Glucose-Capillary 224 (*) 70 - 99 (mg/dL)    Ct Head Wo Contrast  04/25/2011  *RADIOLOGY REPORT*  Clinical Data: Altered mental status.  CT HEAD WITHOUT CONTRAST  Technique:  Contiguous axial images were obtained from the base of the skull through the vertex without contrast.  Comparison: Brain MRI 07/18/2004.  Findings: There is no evidence of acute intracranial abnormality including acute infarction, hemorrhage, mass lesion, mass effect, midline shift or abnormal extra-axial fluid collection.  No hydrocephalus or pneumocephalus.  Calvarium intact.  IMPRESSION: Negative exam.  Original Report Authenticated By: Bernadene Bell. Maricela Curet, M.D.   Dg Chest Port 1 View  04/25/2011  *RADIOLOGY REPORT*  Clinical Data: Shortness of breath.  PORTABLE CHEST - 1 VIEW  Comparison: 01/07/2009  Findings: Atherosclerotic calcification of the aortic arch is present.  A 1.5 x 1.0 cm nodular density projects over the left lung base. Although this is at the intersection of two ribs and accordingly could possibly  be from superimposition of vascular and osseous shadows or from rib callus,  a neoplastic pulmonary nodule cannot be excluded, and CT the chest is recommended.  There is a vague nodularity in the right lung.  Heart size is within normal limits.  No pleural effusion is observed.  IMPRESSION:  1.  1.5 x 1.0 cm nodular density projects over the left lung base and could represent a neoplastic pulmonary nodule.  CT the chest is recommended for further characterization 2.  Minimal nodularity in the right mid to lower lung. 3.  Atherosclerosis.  Original Report Authenticated By: Dellia Cloud, M.D.    ROS:she has been having issues with recent left central retinal artery stroke. Appetite has been good.  She cannot recall events but does not think she was given too much insulin.  Blood pressure 167/75, pulse 57, temperature 91.9 F (33.3 C), temperature source Core (Comment), resp. rate 14, SpO2 100.00%.  Lying semi-supine, no acute distress. alert and oriented times four but weak.  No pallor or icterus. cta bilat. No w/r/r anterolaterally, rrr no m/r/g . abd soft nt/nd /no mass or hsm  Assessment/Plan Patient Active Problem List  Diagnoses  . DM2 with severe hypoglycemic event.  zofran for nausea.  Low dose d5 gtt tonight. To stepdown with every two hour cbg overnight.  She desires dnr order. i have discussed it with her.  Marland Kitchen HYPERLIPIDEMIA-follow, reduce dose of zocor . She may be on 40 mg at home.  Marland Kitchen ANEMIA-follow  . HYPERTENSION-add prn meds  . CAD-follow  . CVA-follow  . CEREBROVASCULAR DISEASE-follow.  Marland Kitchen GERD-follow. She takes medicine at home. Omeprazole.  . IRRITABLE BOWEL SYNDROME-follow.     Romir Klimowicz A, MD 04/25/2011, 9:00 PM  This patient has altered mental status due to hypoglycemia and also possibly due to a  Toxic metabolic issue if it turns out that she has an underlying infection.  I am writing this in response to a cdi query. Thank you.

## 2011-04-25 NOTE — ED Notes (Signed)
Pt removed from bipap per admitting MD. Pt talking. Alert.

## 2011-04-25 NOTE — ED Notes (Addendum)
At this time pt responding to stimuli, opens eyes. Vitals stable. Pt cooperative. Appearing lethargic. Maintaining airway. Pt on bipap. Respiratory, radiology at bedside. Pt skin feeling cool, obtaining rectal temp. No signs of injury or trauma. Hx still unclear of reason for EMS call due to ems being re-routed for pt husband who had cardiac arrest. Pt has spontaneous eye opening. Pt remains on bipap. Maintaining vitals

## 2011-04-26 ENCOUNTER — Other Ambulatory Visit: Payer: Self-pay

## 2011-04-26 ENCOUNTER — Encounter (HOSPITAL_COMMUNITY): Payer: Self-pay

## 2011-04-26 DIAGNOSIS — R4182 Altered mental status, unspecified: Secondary | ICD-10-CM | POA: Diagnosis present

## 2011-04-26 HISTORY — DX: Altered mental status, unspecified: R41.82

## 2011-04-26 LAB — COMPREHENSIVE METABOLIC PANEL
Albumin: 3.5 g/dL (ref 3.5–5.2)
BUN: 20 mg/dL (ref 6–23)
Chloride: 102 mEq/L (ref 96–112)
Creatinine, Ser: 0.8 mg/dL (ref 0.50–1.10)
GFR calc Af Amer: 81 mL/min — ABNORMAL LOW (ref >90)
Total Bilirubin: 0.7 mg/dL (ref 0.3–1.2)
Total Protein: 6.5 g/dL (ref 6.0–8.3)

## 2011-04-26 LAB — CBC
MCV: 90.6 fL (ref 78.0–100.0)
Platelets: 292 10*3/uL (ref 150–400)
RBC: 3.52 MIL/uL — ABNORMAL LOW (ref 3.87–5.11)
WBC: 26.8 10*3/uL — ABNORMAL HIGH (ref 4.0–10.5)

## 2011-04-26 LAB — TSH: TSH: 2.072 u[IU]/mL (ref 0.350–4.500)

## 2011-04-26 LAB — GLUCOSE, CAPILLARY
Glucose-Capillary: 255 mg/dL — ABNORMAL HIGH (ref 70–99)
Glucose-Capillary: 334 mg/dL — ABNORMAL HIGH (ref 70–99)
Glucose-Capillary: 422 mg/dL — ABNORMAL HIGH (ref 70–99)
Glucose-Capillary: 217 mg/dL — ABNORMAL HIGH (ref 70–99)

## 2011-04-26 LAB — DIFFERENTIAL
Eosinophils Relative: 0 % (ref 0–5)
Lymphocytes Relative: 3 % — ABNORMAL LOW (ref 12–46)
Lymphs Abs: 0.7 10*3/uL (ref 0.7–4.0)
Neutro Abs: 24.6 10*3/uL — ABNORMAL HIGH (ref 1.7–7.7)

## 2011-04-26 MED ORDER — BISACODYL 10 MG RE SUPP
10.0000 mg | Freq: Every day | RECTAL | Status: DC | PRN
  Administered 2011-04-26: 10 mg via RECTAL
  Filled 2011-04-26: qty 1

## 2011-04-26 MED ORDER — FLEET ENEMA 7-19 GM/118ML RE ENEM
1.0000 | ENEMA | Freq: Every day | RECTAL | Status: DC | PRN
  Administered 2011-04-26: 1 via RECTAL
  Filled 2011-04-26 (×2): qty 1

## 2011-04-26 MED ORDER — ALPRAZOLAM 0.25 MG PO TABS
0.2500 mg | ORAL_TABLET | Freq: Three times a day (TID) | ORAL | Status: DC | PRN
  Administered 2011-04-26 – 2011-04-30 (×3): 0.25 mg via ORAL
  Filled 2011-04-26 (×3): qty 1

## 2011-04-26 MED ORDER — INSULIN ASPART 100 UNIT/ML ~~LOC~~ SOLN
0.0000 [IU] | Freq: Three times a day (TID) | SUBCUTANEOUS | Status: DC
  Administered 2011-04-26: 9 [IU] via SUBCUTANEOUS
  Administered 2011-04-27: 3 [IU] via SUBCUTANEOUS
  Administered 2011-04-27: 9 [IU] via SUBCUTANEOUS
  Administered 2011-04-27: 3 [IU] via SUBCUTANEOUS
  Administered 2011-04-28: 5 [IU] via SUBCUTANEOUS
  Administered 2011-04-28: 7 [IU] via SUBCUTANEOUS
  Administered 2011-04-28 – 2011-04-29 (×4): 9 [IU] via SUBCUTANEOUS
  Administered 2011-04-30: 10 [IU] via SUBCUTANEOUS
  Administered 2011-04-30: 7 [IU] via SUBCUTANEOUS
  Administered 2011-04-30: 1 [IU] via SUBCUTANEOUS
  Administered 2011-05-01: 5 [IU] via SUBCUTANEOUS
  Filled 2011-04-26: qty 3

## 2011-04-26 MED ORDER — POLYETHYLENE GLYCOL 3350 17 G PO PACK
17.0000 g | PACK | Freq: Every day | ORAL | Status: DC | PRN
  Filled 2011-04-26: qty 1

## 2011-04-26 MED ORDER — INSULIN ASPART 100 UNIT/ML ~~LOC~~ SOLN
0.0000 [IU] | Freq: Three times a day (TID) | SUBCUTANEOUS | Status: DC
  Administered 2011-04-26: 9 [IU] via SUBCUTANEOUS
  Filled 2011-04-26: qty 3

## 2011-04-26 NOTE — Progress Notes (Signed)
Pt evaluated for long term disease management services with [facility] Management program as a benefit of [employer]. Due to the nature of the pt's family issues at present, a RN case manager will do a post d/c transition of care call and home visit for assessments for DM, HTN, hyperlipidemia and other needs at a later time.   Brooke Bonito C. Roena Malady, RN, MS, CCM [facility] Liaison [facility] [phone]

## 2011-04-26 NOTE — Clinical Documentation Improvement (Signed)
CHANGE MENTAL STATUS DOCUMENTATION CLARIFICATION   THIS DOCUMENT IS NOT A PERMANENT PART OF THE MEDICAL RECORD  TO RESPOND TO THE THIS QUERY, FOLLOW THE INSTRUCTIONS BELOW:  1. If needed, update documentation for the patient's encounter via the notes activity.  2. Access this query again and click edit on the In [employer].  3. After updating, or not, click F2 to complete all highlighted (required) fields concerning your review. Select "additional documentation in the medical record" OR "no additional documentation provided".  4. Click Sign note button.  5. The deficiency will fall out of your In Basket *Please let us know if you are not able to complete this workflow by phone or e-mail (listed below).         04/26/11  Dear Dr. Waynard Edwards Marton Redwood  In an effort to better capture your patient's severity of illness, reflect appropriate length of stay and utilization of resources, a review of the patient medical record has revealed the following indicators.   In responding to this query please exercise your independent judgment.  The fact that a query is asked, does not imply that any particular answer is desired or expected. PLEASE CLARIFY "AMS" PER INFO BELOW. THANK YOU.  Possible Clinical Conditions?  Encephalopathy (describe type if known) - Anoxic - Septic - Hypertensive - Metabolic -Toxic  Acute confusion Acute delirium Coma Hyponatremia / Hypernatremia Poisoning / Overdose Hypoxemia / Hypoxia  Other Condition (please specify) Cannot Clinically Determine   Supporting Information: Risk Factors: Per H&P: severe hypoglycemia  Signs & Symptoms: Per ED MD: "endocrine crisis and CNS failure or compromise (Evaluation and treatment of altered mental status and hypoglycemia of 24).  Per H&P: "brought to ER and found to be unresponsive with a  cbg of 26."  Diagnostics: Lab: CBG: 24 Treatment: Stepdown for q2h CBG, two doses of d50   Reviewed: additional documentation in  the medical record  Yes, she had altered mental status from hypoglycemia and also some other factors may have contributed as well.  Thank You,  Beverley Fiedler RN Clinical Documentation Specialist: CELL:  [phone]  Health Information Management [place]

## 2011-04-26 NOTE — Progress Notes (Signed)
Subjective: Patient is seen today with daughter is at the bedside. Needless to say she is hardly Gries drip and given the events that unfolded with her husband at the time of her events and with his impending death. Her only complaint is actually constipation and nausea but blood sugars have bounced back. She's having no cough or congestion. She's having no vomiting.  Objective: Vital signs in last 24 hours: Temp:  [91.4 F (33 C)-102 F (38.9 C)] 98.7 F (37.1 C) (01/24 0500) Pulse Rate:  [50-90] 80  (01/24 0500) Resp:  [12-20] 20  (01/24 0500) BP: (133-205)/(50-144) 172/69 mmHg (01/24 0500) SpO2:  [91 %-100 %] 99 % (01/24 0500) FiO2 (%):  [60 %] 60 % (01/23 1820) Weight:  [69.5 kg (153 lb 3.5 oz)-70.1 kg (154 lb 8.7 oz)] 70.1 kg (154 lb 8.7 oz) (01/24 0500) Weight change:   CBG (last 3)   Basename 04/26/11 0802 04/26/11 0631 04/26/11 0356  GLUCAP 422* 334* 253*    Intake/Output from previous day: 01/23 0701 - 01/24 0700 In: 771.7 [P.O.:200; I.V.:571.7] Out: 800 [Urine:700; Emesis/NG output:100]  Physical Exam: Patient is awake alert interactive a bit drowsy but is a been a long nice. She answers questions appropriately. Lungs are grossly clear. Cardiovascular exam regular rate and rhythm no murmur. Abdomen is soft and nontender good bowel sounds. She moves extremities x4 neurologic exam is non-lateralizing. Her cortical functioning is intact.   Lab Results:  Basename 04/26/11 0600 04/25/11 1721  NA 136 142  K 4.6 3.3*  CL 102 107  CO2 20 23  GLUCOSE 362* 30*  BUN 20 19  CREATININE 0.80 0.87  CALCIUM 9.4 9.9  MG -- --  PHOS -- --    Basename 04/26/11 0600 04/25/11 1721  AST 25 23  ALT 13 12  ALKPHOS 101 112  BILITOT 0.7 0.3  PROT 6.5 7.5  ALBUMIN 3.5 4.0    Basename 04/26/11 0600 04/25/11 1721  WBC 26.8* 9.6  NEUTROABS 24.6* 6.1  HGB 10.6* 11.5*  HCT 31.9* 35.2*  MCV 90.6 90.0  PLT 292 390   Lab Results  Component Value Date   INR 1.04 04/25/2011   INR 0.96 01/07/2009    Basename 04/25/11 1721  CKTOTAL 89  CKMB 3.0  CKMBINDEX --  TROPONINI <0.30   No results found for this basename: TSH,T4TOTAL,FREET3,T3FREE,THYROIDAB in the last 72 hours No results found for this basename: VITAMINB12:2,FOLATE:2,FERRITIN:2,TIBC:2,IRON:2,RETICCTPCT:2 in the last 72 hours  Studies/Results: Ct Head Wo Contrast  04/25/2011  *RADIOLOGY REPORT*  Clinical Data: Altered mental status.  CT HEAD WITHOUT CONTRAST  Technique:  Contiguous axial images were obtained from the base of the skull through the vertex without contrast.  Comparison: Brain MRI 07/18/2004.  Findings: There is no evidence of acute intracranial abnormality including acute infarction, hemorrhage, mass lesion, mass effect, midline shift or abnormal extra-axial fluid collection.  No hydrocephalus or pneumocephalus.  Calvarium intact.  IMPRESSION: Negative exam.  Original Report Authenticated By: Bernadene Bell. Maricela Curet, M.D.   Dg Chest Port 1 View  04/25/2011  *RADIOLOGY REPORT*  Clinical Data: Shortness of breath.  PORTABLE CHEST - 1 VIEW  Comparison: 01/07/2009  Findings: Atherosclerotic calcification of the aortic arch is present.  A 1.5 x 1.0 cm nodular density projects over the left lung base. Although this is at the intersection of two ribs and accordingly could possibly be from superimposition of vascular and osseous shadows or from rib callus, a neoplastic pulmonary nodule cannot be excluded, and CT the chest is recommended.  There is a vague nodularity in the right lung.  Heart size is within normal limits.  No pleural effusion is observed.  IMPRESSION:  1.  1.5 x 1.0 cm nodular density projects over the left lung base and could represent a neoplastic pulmonary nodule.  CT the chest is recommended for further characterization 2.  Minimal nodularity in the right mid to lower lung. 3.  Atherosclerosis.  Original Report Authenticated By: Dellia Cloud, M.D.     Assessment/Plan: #1 diabetes  mellitus 2 with severe hypoglycemic event and protracted unresponsiveness blood sugars labile but stabilizing  #2 constipation will provide laxatives and enema  #3 essential hypertension stable  #4 hyperlipidemia stable  #5 adjustment reaction stable  #6 coronary artery disease stable no symptoms  Plan Will [place] diet, hydrate, mobilize and resolve bowel issues. Please note that the elevated white count I believe is a leukemoid reaction from the events prior I find no nidus of infection or underlying evidence of sepsis this time will monitor   LOS: 1 day   Dacoda Finlay A 04/26/2011, 8:31 AM

## 2011-04-26 NOTE — Progress Notes (Signed)
This is a f/u visit to provide continue support to Pt. and family.  Husband is a pt.in room 2905  actively dying.  Life support has been removed. Family and friends at bedside.  I spoke with grandson and a grand- daughter who informed me that the pt. was better than the night before but still in an altered mental status. Pt. is aware of husband condition and a visit has been arranged for her to see husband.  Church is involved. Provide presence and offered spiritual Care services if needed.  Will follow PRN.   04/26/11 1000  Clinical Encounter Type  Visited With Family  Visit Type Follow-up;Spiritual support  Referral From Chaplain  Spiritual Encounters  Spiritual Needs Emotional  Stress Factors  Family Stress Factors None identified

## 2011-04-27 ENCOUNTER — Inpatient Hospital Stay (HOSPITAL_COMMUNITY): Payer: Medicare Other

## 2011-04-27 ENCOUNTER — Other Ambulatory Visit: Payer: Self-pay

## 2011-04-27 LAB — COMPREHENSIVE METABOLIC PANEL
Albumin: 3.4 g/dL — ABNORMAL LOW (ref 3.5–5.2)
BUN: 25 mg/dL — ABNORMAL HIGH (ref 6–23)
Calcium: 9.5 mg/dL (ref 8.4–10.5)
GFR calc Af Amer: 75 mL/min — ABNORMAL LOW (ref >90)
Glucose, Bld: 334 mg/dL — ABNORMAL HIGH (ref 70–99)
Total Protein: 6.5 g/dL (ref 6.0–8.3)

## 2011-04-27 LAB — CBC
HCT: 31.5 % — ABNORMAL LOW (ref 36.0–46.0)
Hemoglobin: 10.3 g/dL — ABNORMAL LOW (ref 12.0–15.0)
MCV: 90.3 fL (ref 78.0–100.0)
RBC: 3.49 MIL/uL — ABNORMAL LOW (ref 3.87–5.11)
WBC: 24.7 10*3/uL — ABNORMAL HIGH (ref 4.0–10.5)

## 2011-04-27 LAB — GLUCOSE, CAPILLARY
Glucose-Capillary: 403 mg/dL — ABNORMAL HIGH (ref 70–99)
Glucose-Capillary: 212 mg/dL — ABNORMAL HIGH (ref 70–99)
Glucose-Capillary: 205 mg/dL — ABNORMAL HIGH (ref 70–99)

## 2011-04-27 LAB — DIFFERENTIAL
Basophils Absolute: 0 10*3/uL (ref 0.0–0.1)
Lymphocytes Relative: 6 % — ABNORMAL LOW (ref 12–46)
Lymphs Abs: 1.5 10*3/uL (ref 0.7–4.0)
Monocytes Absolute: 1.2 10*3/uL — ABNORMAL HIGH (ref 0.1–1.0)
Monocytes Relative: 5 % (ref 3–12)
Neutro Abs: 22 10*3/uL — ABNORMAL HIGH (ref 1.7–7.7)

## 2011-04-27 LAB — CARDIAC PANEL(CRET KIN+CKTOT+MB+TROPI)
CK, MB: 4.7 ng/mL — ABNORMAL HIGH (ref 0.3–4.0)
Total CK: 159 U/L (ref 7–177)

## 2011-04-27 MED ORDER — PIPERACILLIN-TAZOBACTAM 3.375 G IVPB
3.3750 g | Freq: Three times a day (TID) | INTRAVENOUS | Status: DC
  Administered 2011-04-27 – 2011-04-30 (×8): 3.375 g via INTRAVENOUS
  Filled 2011-04-27 (×10): qty 50

## 2011-04-27 MED ORDER — PANTOPRAZOLE SODIUM 40 MG PO TBEC: 40.0000 mg | DELAYED_RELEASE_TABLET | Freq: Every day | ORAL | Status: DC

## 2011-04-27 MED ORDER — PROMETHAZINE HCL 25 MG/ML IJ SOLN
25.0000 mg | INTRAMUSCULAR | Status: DC | PRN
  Filled 2011-04-27: qty 1

## 2011-04-27 MED ORDER — NITROGLYCERIN 2 % TD OINT
1.0000 [in_us] | TOPICAL_OINTMENT | Freq: Three times a day (TID) | TRANSDERMAL | Status: DC
  Administered 2011-04-27 – 2011-04-28 (×5): 1 [in_us] via TOPICAL
  Filled 2011-04-27: qty 30

## 2011-04-27 MED ORDER — PROMETHAZINE HCL 25 MG/ML IJ SOLN
12.5000 mg | INTRAMUSCULAR | Status: DC | PRN
  Administered 2011-04-27: 12.5 mg via INTRAVENOUS
  Filled 2011-04-27: qty 1

## 2011-04-27 MED ORDER — HYDRALAZINE HCL 20 MG/ML IJ SOLN
10.0000 mg | Freq: Once | INTRAMUSCULAR | Status: AC
  Administered 2011-04-27: 10 mg via INTRAVENOUS

## 2011-04-27 MED ORDER — HYDRALAZINE HCL 20 MG/ML IJ SOLN
10.0000 mg | INTRAMUSCULAR | Status: DC | PRN
  Administered 2011-04-27 – 2011-04-29 (×4): 10 mg via INTRAVENOUS
  Filled 2011-04-27 (×5): qty 1

## 2011-04-27 MED ORDER — INSULIN GLARGINE 100 UNIT/ML ~~LOC~~ SOLN
15.0000 [IU] | SUBCUTANEOUS | Status: DC
  Administered 2011-04-27 – 2011-04-28 (×2): 15 [IU] via SUBCUTANEOUS
  Filled 2011-04-27: qty 3

## 2011-04-27 NOTE — Progress Notes (Signed)
pts BP 199/73s, pt with no complaints of headache or CP;already gave PRN hydrazaline around 1330;MD paged and made aware, told to give another 10mg  IV dose; will continue to monitor

## 2011-04-27 NOTE — Progress Notes (Signed)
Pt had one epsidoe of vomiting yellow emesis; recheck after PRN hydrazaline Bp 186/64; listened to pts lungs after, heard diminished changes on R side; Dr.Aronson paged and made aware, also made aware that pt unable to drink contrast for CT; will continue to monitor

## 2011-04-27 NOTE — Plan of Care (Signed)
Problem: Consults Goal: General Medical Patient Education See Patient Education Module for specific education.  Outcome: Progressing Pt. Guide book  Safety video

## 2011-04-27 NOTE — Progress Notes (Signed)
pts BP 199/73; pt with no complaints of headache or CP, only complains of nausea; CBG 403 this AM; called Dr.Russo and made aware, told to give am dose of metoprolol, hep loc pt, and give 9 units of insulin; will continue to monitor

## 2011-04-27 NOTE — Progress Notes (Signed)
Subjective: Patient remains weak and washed out with persistent nausea. He is having no vomiting he denies any chest pain shortness of breath or abdominal pain. She's having no cough or sputum. She is obviously [place] stricken with her husband passing yesterday downstairs in the ICU. Daughter is at the bedside.  Objective: Vital signs in last 24 hours: Temp:  [98.4 F (36.9 C)-99.6 F (37.6 C)] 98.6 F (37 C) (01/25 0500) Pulse Rate:  [68-79] 69  (01/25 0700) Resp:  [18-20] 19  (01/25 0500) BP: (186-199)/(69-78) 199/73 mmHg (01/25 0700) SpO2:  [94 %-100 %] 94 % (01/25 0500) Weight:  [70.761 kg (156 lb)] 70.761 kg (156 lb) (01/25 0500) Weight change: 1.261 kg (2 lb 12.5 oz)  CBG (last 3)   Basename 04/27/11 0750 04/26/11 2102 04/26/11 1659  GLUCAP 403* 217* 415*    Intake/Output from previous day: 01/24 0701 - 01/25 0700 In: 2158.3 [P.O.:120; I.V.:2038.3] Out: 908 [Urine:902; Stool:6]  Physical Exam: Patient is awake alert does appear a bit pale. She is no JVD or bruits. She is interactive and appropriate. Lungs are clear. Cardiovascular exam regular rate and rhythm no murmur. Abdomen is soft and nontender bowel sounds normal. No rebound or guarding. Extremities are warm distally intact pulses joints normal. Neurologically beyond diffuse weakness she is nonlateralizing.   Lab Results:  Basename 04/27/11 0531 04/26/11 0600  NA 136 136  K 4.2 4.6  CL 104 102  CO2 19 20  GLUCOSE 334* 362*  BUN 25* 20  CREATININE 0.85 0.80  CALCIUM 9.5 9.4  MG -- --  PHOS -- --    Basename 04/27/11 0531 04/26/11 0600  AST 21 25  ALT 12 13  ALKPHOS 97 101  BILITOT 0.6 0.7  PROT 6.5 6.5  ALBUMIN 3.4* 3.5    Basename 04/27/11 0531 04/26/11 0600  WBC 24.7* 26.8*  NEUTROABS 22.0* 24.6*  HGB 10.3* 10.6*  HCT 31.5* 31.9*  MCV 90.3 90.6  PLT 282 292   Lab Results  Component Value Date   INR 1.04 04/25/2011   INR 0.96 01/07/2009    Basename 04/25/11 1721  CKTOTAL 89  CKMB 3.0   CKMBINDEX --  TROPONINI <0.30    Basename 04/25/11 2214  TSH 2.072  T4TOTAL --  T3FREE --  THYROIDAB --   No results found for this basename: VITAMINB12:2,FOLATE:2,FERRITIN:2,TIBC:2,IRON:2,RETICCTPCT:2 in the last 72 hours  Studies/Results: Ct Head Wo Contrast  04/25/2011  *RADIOLOGY REPORT*  Clinical Data: Altered mental status.  CT HEAD WITHOUT CONTRAST  Technique:  Contiguous axial images were obtained from the base of the skull through the vertex without contrast.  Comparison: Brain MRI 07/18/2004.  Findings: There is no evidence of acute intracranial abnormality including acute infarction, hemorrhage, mass lesion, mass effect, midline shift or abnormal extra-axial fluid collection.  No hydrocephalus or pneumocephalus.  Calvarium intact.  IMPRESSION: Negative exam.  Original Report Authenticated By: Bernadene Bell. Maricela Curet, M.D.   Dg Chest Port 1 View  04/25/2011  *RADIOLOGY REPORT*  Clinical Data: Shortness of breath.  PORTABLE CHEST - 1 VIEW  Comparison: 01/07/2009  Findings: Atherosclerotic calcification of the aortic arch is present.  A 1.5 x 1.0 cm nodular density projects over the left lung base. Although this is at the intersection of two ribs and accordingly could possibly be from superimposition of vascular and osseous shadows or from rib callus, a neoplastic pulmonary nodule cannot be excluded, and CT the chest is recommended.  There is a vague nodularity in the right lung.  Heart size  is within normal limits.  No pleural effusion is observed.  IMPRESSION:  1.  1.5 x 1.0 cm nodular density projects over the left lung base and could represent a neoplastic pulmonary nodule.  CT the chest is recommended for further characterization 2.  Minimal nodularity in the right mid to lower lung. 3.  Atherosclerosis.  Original Report Authenticated By: Dellia Cloud, M.D.     Assessment/Plan: #1 diabetes mellitus type 2 with protracted hypoglycemia now hyperglycemic on sliding scale alone.  Will intensify her insulin control.  #2 persistent nausea with elevated white blood cell count nothing obvious by exam or history will check gallbladder, continue IV fluids, initiate proton pump inhibitor and check EKG for atypical presentation of unstable angina  #3 essential hypertension poorly controlled at this time will add some hydralazine  #4 adjustment reaction hardly greased stricken counseled family at the bedside  #5 coronary artery disease no symptoms but will check EKG and enzymes   LOS: 2 days   Donnica Jarnagin A 04/27/2011, 8:39 AM

## 2011-04-28 LAB — CARDIAC PANEL(CRET KIN+CKTOT+MB+TROPI)
CK, MB: 4.3 ng/mL — ABNORMAL HIGH (ref 0.3–4.0)
Relative Index: INVALID (ref 0.0–2.5)

## 2011-04-28 LAB — CBC
Hemoglobin: 9.6 g/dL — ABNORMAL LOW (ref 12.0–15.0)
MCH: 29.6 pg (ref 26.0–34.0)
MCV: 88.9 fL (ref 78.0–100.0)
RBC: 3.24 MIL/uL — ABNORMAL LOW (ref 3.87–5.11)

## 2011-04-28 LAB — DIFFERENTIAL
Eosinophils Absolute: 0 10*3/uL (ref 0.0–0.7)
Eosinophils Relative: 0 % (ref 0–5)
Lymphs Abs: 1.2 10*3/uL (ref 0.7–4.0)
Monocytes Relative: 4 % (ref 3–12)

## 2011-04-28 LAB — GLUCOSE, CAPILLARY
Glucose-Capillary: 368 mg/dL — ABNORMAL HIGH (ref 70–99)
Glucose-Capillary: 301 mg/dL — ABNORMAL HIGH (ref 70–99)

## 2011-04-28 LAB — COMPREHENSIVE METABOLIC PANEL
AST: 16 U/L (ref 0–37)
Albumin: 3.3 g/dL — ABNORMAL LOW (ref 3.5–5.2)
Chloride: 104 mEq/L (ref 96–112)
Creatinine, Ser: 0.83 mg/dL (ref 0.50–1.10)
Sodium: 138 mEq/L (ref 135–145)
Total Bilirubin: 0.7 mg/dL (ref 0.3–1.2)

## 2011-04-28 MED ORDER — PANTOPRAZOLE SODIUM 40 MG PO TBEC
40.0000 mg | DELAYED_RELEASE_TABLET | Freq: Every day | ORAL | Status: DC
  Administered 2011-04-29 – 2011-05-01 (×3): 40 mg via ORAL
  Filled 2011-04-28 (×3): qty 1

## 2011-04-28 MED ORDER — INSULIN GLARGINE 100 UNIT/ML ~~LOC~~ SOLN
20.0000 [IU] | SUBCUTANEOUS | Status: DC
  Administered 2011-04-29: 20 [IU] via SUBCUTANEOUS

## 2011-04-28 MED ORDER — LOSARTAN POTASSIUM 50 MG PO TABS
50.0000 mg | ORAL_TABLET | Freq: Every day | ORAL | Status: DC
  Administered 2011-04-28 – 2011-05-01 (×4): 50 mg via ORAL
  Filled 2011-04-28 (×4): qty 1

## 2011-04-28 NOTE — Progress Notes (Signed)
Pt's BP 199/74 at 1231 AM. Gave 10mg  of IV hydralazine. Rechecked BP @ 1:25 AM, BP came down to 155/64. Will continue to monitor.

## 2011-04-28 NOTE — Plan of Care (Signed)
Problem: Phase I Progression Outcomes Goal: Hemodynamically stable Outcome: Not Progressing B/Pcont to require med adjustments and PRN meds. CBG cont to be elevated

## 2011-04-28 NOTE — Progress Notes (Signed)
MD called with results of abdominal CT, pt placed on Zozyn per order.

## 2011-04-28 NOTE — Progress Notes (Signed)
10mg  hydralazine given for B/P 194/80 will cont to monitor

## 2011-04-28 NOTE — Progress Notes (Signed)
B/P recheck 157/64

## 2011-04-28 NOTE — Progress Notes (Signed)
Subjective: [name] is much better today He was able to tolerate some oral intake. No further nausea vomiting. No further abdominal pain. Family is at the bedside and she recognizes that patient is better as well. She still horribly grief stricken with the sudden death of her husband while he was summoning EMS for her. All scans are noted and discussed with family. Antibiotics were started based upon the left lower lobe infiltrate detected on CT scan which was coincidental and may indeed explain her white count.  Objective: Vital signs in last 24 hours: Temp:  [98.3 F (36.8 C)-99.3 F (37.4 C)] 98.7 F (37.1 C) (01/26 1400) Pulse Rate:  [65-83] 65  (01/26 1400) Resp:  [19-20] 19  (01/26 1400) BP: (155-199)/(64-96) 180/70 mmHg (01/26 1400) SpO2:  [95 %-98 %] 97 % (01/26 1400) Weight change:   CBG (last 3)   Basename 04/28/11 1208 04/28/11 0747 04/27/11 2110  GLUCAP 368* 338* 205*    Intake/Output from previous day: 01/25 0701 - 01/26 0700 In: 940 [P.O.:240; I.V.:600; IV Piggyback:100] Out: 200 [Urine:200]  Physical Exam: Patient is awake alert much more interactive calm her no distress. No JVD or bruits. Lungs are clear. Cardiovascular exam regular rate and rhythm no murmur gallop or heave. Abdomen is soft nontender good bowel sounds. Extremities no peripheral edema intact pulses. Neurologically she is normal.   Lab Results:  Basename 04/28/11 0630 04/27/11 0531  NA 138 136  K 4.1 4.2  CL 104 104  CO2 16* 19  GLUCOSE 302* 334*  BUN 23 25*  CREATININE 0.83 0.85  CALCIUM 9.5 9.5  MG -- --  PHOS -- --    Basename 04/28/11 0630 04/27/11 0531  AST 16 21  ALT 10 12  ALKPHOS 95 97  BILITOT 0.7 0.6  PROT 6.4 6.5  ALBUMIN 3.3* 3.4*    Basename 04/28/11 0630 04/27/11 0531  WBC 26.7* 24.7*  NEUTROABS 24.4* 22.0*  HGB 9.6* 10.3*  HCT 28.8* 31.5*  MCV 88.9 90.3  PLT 281 282   Lab Results  Component Value Date   INR 1.04 04/25/2011   INR 0.96 01/07/2009     Basename 04/28/11 0850 04/27/11 2326 04/27/11 1614  CKTOTAL 87 130 159  CKMB 3.7 4.3* 4.7*  CKMBINDEX -- -- --  TROPONINI <0.30 <0.30 <0.30    Basename 04/25/11 2214  TSH 2.072  T4TOTAL --  T3FREE --  THYROIDAB --   No results found for this basename: VITAMINB12:2,FOLATE:2,FERRITIN:2,TIBC:2,IRON:2,RETICCTPCT:2 in the last 72 hours  Studies/Results: Ct Abdomen Pelvis Wo Contrast  04/27/2011  *RADIOLOGY REPORT*  Clinical Data: Nausea and vomiting.  Abdominal pain  CT ABDOMEN AND PELVIS WITHOUT CONTRAST  Technique:  Multidetector CT imaging of the abdomen and pelvis was performed following the standard protocol without intravenous contrast.  Comparison: CT thorax 06/10/2006 there is  Findings: There  is fine air space disease/ground-glass opacities at the left lung base.  Calcified granuloma in the lingula.  No pleural fluid. No pericardial fluid.  Non-IV contrast images demonstrate no focal hepatic lesion.  The gallbladder, pancreas, spleen, adrenal glands, and kidneys are normal.  Stomach is normal.  Large hiatal hernia is present.  Small bowel and colon are normal.  Appendix is normal.  Abdominal aorta normal caliber.  No retroperitoneal periportal lymphadenopathy.  No free fluid in the pelvis.  The bladder is distended.  Post hysterectomy anatomy.  The left ovary is prominent measuring 3.3 x 2.1 cm.  No aggressive features.  No free fluid the pelvis.  No pelvic  lymphadenopathy.  IMPRESSION:  1.  Air space disease in the left lower lobe.   Differential includes pneumonia versus pulmonary edema versus pneumonitis.  2.  Distended bladder.  3.  Large hiatal hernia.  Original Report Authenticated By: Genevive Bi, M.D.   US Abdomen Complete  04/27/2011  *RADIOLOGY REPORT*  Clinical Data: Nausea.  ABDOMEN ULTRASOUND  Technique:  Complete abdominal ultrasound examination was performed including evaluation of the liver, gallbladder, bile ducts, pancreas, kidneys, spleen, IVC, and abdominal  aorta.  Comparison: No comparison studies available.  Findings:  Gallbladder:  There is no evidence for gallstones.  No gallbladder wall thickening or pericholecystic fluid.  The sonographer reports no sonographic Murphy's sign.  Common Bile Duct:  Nondilated at 2 mm diameter.  Liver:  Normal.  No focal parenchymal abnormality.  No biliary dilation.  IVC:  Normal.  Pancreas:  Normal.  Spleen:  Normal.  Right kidney:  9.8 cm in long axis.  Normal.  Left kidney:  8.9 cm in long axis.  Normal.  Abdominal Aorta:  No aneurysm.  IMPRESSION: Unremarkable exam.  No findings to explain the patient's history of nausea.  Original Report Authenticated By: ERIC Glover. MANSELL, M.D.     Assessment/Plan: #1 diabetes mellitus type 2 with Glover protracted hypoglycemic episode currently more labile intensifying control  #2 persistent GI symptoms resolved unclear etiology gallbladder ultrasound and CT of abdomen negative  #3 left lower lobe pneumonia with leukocytosis currently on Zosyn  #4 essential hypertension slowly stabilizing ARB to be started  #5 coronary artery disease certainly no evidence of ischemia enzymes negative EKG okay   LOS: 3 days   [name] 04/28/2011, 3:24 PM

## 2011-04-28 NOTE — Progress Notes (Signed)
Family and pt. Requesting not to have F/C reports pt. Is prone to UTI and would prefer not to have it reinserted. Will monitor UA output and insert if UA output less that 40cc/hr

## 2011-04-29 LAB — GLUCOSE, CAPILLARY
Glucose-Capillary: 372 mg/dL — ABNORMAL HIGH (ref 70–99)
Glucose-Capillary: 452 mg/dL — ABNORMAL HIGH (ref 70–99)
Glucose-Capillary: >600 mg/dL (ref 70–99)
Glucose-Capillary: >600 mg/dL (ref 70–99)
Glucose-Capillary: >600 mg/dL (ref 70–99)

## 2011-04-29 LAB — COMPREHENSIVE METABOLIC PANEL
Albumin: 2.9 g/dL — ABNORMAL LOW (ref 3.5–5.2)
BUN: 27 mg/dL — ABNORMAL HIGH (ref 6–23)
CO2: 19 mEq/L (ref 19–32)
Chloride: 104 mEq/L (ref 96–112)
Creatinine, Ser: 1.08 mg/dL (ref 0.50–1.10)
GFR calc Af Amer: 56 mL/min — ABNORMAL LOW (ref >90)
GFR calc non Af Amer: 49 mL/min — ABNORMAL LOW (ref >90)
Total Bilirubin: 0.7 mg/dL (ref 0.3–1.2)

## 2011-04-29 LAB — CBC
HCT: 28.1 % — ABNORMAL LOW (ref 36.0–46.0)
Hemoglobin: 9.3 g/dL — ABNORMAL LOW (ref 12.0–15.0)
MCH: 29.4 pg (ref 26.0–34.0)
MCHC: 33.1 g/dL (ref 30.0–36.0)
MCV: 88.9 fL (ref 78.0–100.0)
Platelets: 276 K/uL (ref 150–400)
RBC: 3.16 MIL/uL — ABNORMAL LOW (ref 3.87–5.11)
RDW: 13.8 % (ref 11.5–15.5)
WBC: 19 K/uL — ABNORMAL HIGH (ref 4.0–10.5)

## 2011-04-29 LAB — DIFFERENTIAL
Lymphocytes Relative: 7 % — ABNORMAL LOW (ref 12–46)
Lymphs Abs: 1.4 10*3/uL (ref 0.7–4.0)
Neutrophils Relative %: 87 % — ABNORMAL HIGH (ref 43–77)

## 2011-04-29 LAB — GLUCOSE, RANDOM
Glucose, Bld: 698 mg/dL (ref 70–99)
Glucose, Bld: 755 mg/dL (ref 70–99)

## 2011-04-29 MED ORDER — INSULIN ASPART 100 UNIT/ML ~~LOC~~ SOLN
17.0000 [IU] | Freq: Once | SUBCUTANEOUS | Status: AC
  Administered 2011-04-29: 17 [IU] via SUBCUTANEOUS

## 2011-04-29 MED ORDER — INSULIN GLARGINE 100 UNIT/ML ~~LOC~~ SOLN
20.0000 [IU] | Freq: Two times a day (BID) | SUBCUTANEOUS | Status: DC
  Administered 2011-04-29 – 2011-04-30 (×2): 20 [IU] via SUBCUTANEOUS
  Filled 2011-04-29: qty 3

## 2011-04-29 MED ORDER — INSULIN ASPART 100 UNIT/ML ~~LOC~~ SOLN
5.0000 [IU] | Freq: Once | SUBCUTANEOUS | Status: AC
  Administered 2011-04-29: 5 [IU] via SUBCUTANEOUS

## 2011-04-29 NOTE — Progress Notes (Signed)
Lab results called back with critical lab value of 698-glucose. TC to Dr. Waynard Edwards who noted to give additional 5u Novolog sq and to give HS dose of Lantus in 20-30 min. After giving Novolog. Recheck HS CBG will not do any additional measures if CBG between 150-350. Since this is greater than pt. Home dose and pt. Was admitted in hypoglycemic state. Will cont to monitor. Plan discussed with pt. And family member and they acknowledge understanding and agree with plan

## 2011-04-29 NOTE — Progress Notes (Signed)
CBG > 600 order for lab to draw glucose per protocol

## 2011-04-29 NOTE — Plan of Care (Signed)
Problem: Phase I Progression Outcomes Goal: OOB as tolerated unless otherwise ordered Outcome: Completed/Met Date Met:  04/29/11 Walked to solarium sat up in chair x . tol well but extremely fatigued afterward back to  Bed for nap Goal: Hemodynamically stable Outcome: Completed/Met Date Met:  04/29/11 B/P improving. CBG cont to be out of range new Lantus orders noted will cont to monitor appetite remains poor

## 2011-04-29 NOTE — Progress Notes (Signed)
Subjective: 1A and remains a bit weak and washed out. Daughter and nurse her at the bedside. She has not been out of bed significantly. She's had no nausea or vomiting. Her appetite remains poor. She remains withdrawn and distant.  Objective: Vital signs in last 24 hours: Temp:  [97.9 F (36.6 C)-98.7 F (37.1 C)] 97.9 F (36.6 C) (01/27 0500) Pulse Rate:  [65-76] 76  (01/27 1107) Resp:  [18-19] 18  (01/27 0500) BP: (167-194)/(63-80) 167/63 mmHg (01/27 1107) SpO2:  [96 %-97 %] 96 % (01/27 0500) Weight change:   CBG (last 3)   Basename 04/29/11 1131 04/29/11 0738 04/28/11 2130  GLUCAP 452* 372* 301*    Intake/Output from previous day: 01/26 0701 - 01/27 0700 In: 1873.3 [P.O.:540; I.V.:1183.3; IV Piggyback:150] Out: 400 [Urine:400]  Physical Exam: Patient is awake alert flat affect totally withdrawn. Lungs are clear. Cardiovascular exam regular rate and rhythm. Abdomen is soft and nontender. She is no peripheral edema. Neurologic exam is nonlateralizing   Lab Results:  [facility] 04/29/11 0535 04/28/11 0630  NA 136 138  K 3.6 4.1  CL 104 104  CO2 19 16*  GLUCOSE 350* 302*  BUN 27* 23  CREATININE 1.08 0.83  CALCIUM 9.1 9.5  MG -- --  PHOS -- --    Basename 04/29/11 0535 04/28/11 0630  AST 9 16  ALT 9 10  ALKPHOS 85 95  BILITOT 0.7 0.7  PROT 5.7* 6.4  ALBUMIN 2.9* 3.3*    Basename 04/29/11 0535 04/28/11 0630  WBC 19.0* 26.7*  NEUTROABS 16.5* 24.4*  HGB 9.3* 9.6*  HCT 28.1* 28.8*  MCV 88.9 88.9  PLT 276 281   Lab Results  Component Value Date   INR 1.04 04/25/2011   INR 0.96 01/07/2009    Basename 04/28/11 0850 04/27/11 2326 04/27/11 1614  CKTOTAL 87 130 159  CKMB 3.7 4.3* 4.7*  CKMBINDEX -- -- --  TROPONINI <0.30 <0.30 <0.30   No results found for this basename: TSH,T4TOTAL,FREET3,T3FREE,THYROIDAB in the last 72 hours No results found for this basename: VITAMINB12:2,FOLATE:2,FERRITIN:2,TIBC:2,IRON:2,RETICCTPCT:2 in the last 72  hours  Studies/Results: Ct Abdomen Pelvis Wo Contrast  04/27/2011  *RADIOLOGY REPORT*  Clinical Data: Nausea and vomiting.  Abdominal pain  CT ABDOMEN AND PELVIS WITHOUT CONTRAST  Technique:  Multidetector CT imaging of the abdomen and pelvis was performed following the standard protocol without intravenous contrast.  Comparison: CT thorax 06/10/2006 there is  Findings: There  is fine air space disease/ground-glass opacities at the left lung base.  Calcified granuloma in the lingula.  No pleural fluid. No pericardial fluid.  Non-IV contrast images demonstrate no focal hepatic lesion.  The gallbladder, pancreas, spleen, adrenal glands, and kidneys are normal.  Stomach is normal.  Large hiatal hernia is present.  Small bowel and colon are normal.  Appendix is normal.  Abdominal aorta normal caliber.  No retroperitoneal periportal lymphadenopathy.  No free fluid in the pelvis.  The bladder is distended.  Post hysterectomy anatomy.  The left ovary is prominent measuring 3.3 x 2.1 cm.  No aggressive features.  No free fluid the pelvis.  No pelvic lymphadenopathy.  IMPRESSION:  1.  Air space disease in the left lower lobe.   Differential includes pneumonia versus pulmonary edema versus pneumonitis.  2.  Distended bladder.  3.  Large hiatal hernia.  Original Report Authenticated By: Genevive Bi, M.D.     Assessment/Plan:  #1 diabetes mellitus type 2 with a protracted hypoglycemic episode currently more labile intensifying control  #2 persistent GI  symptoms resolved unclear etiology gallbladder ultrasound and CT of abdomen negative  #3 left lower lobe pneumonia with leukocytosis currently on Zosyn  #4 essential hypertension slowly stabilizing ARB to be started  #5 coronary artery disease certainly no evidence of ischemia enzymes negative EKG okay      LOS: 4 days   Nabor Thomann A 04/29/2011, 12:37 PM

## 2011-04-30 LAB — DIFFERENTIAL
Eosinophils Relative: 0 % (ref 0–5)
Lymphocytes Relative: 15 % (ref 12–46)
Lymphs Abs: 2 10*3/uL (ref 0.7–4.0)
Neutro Abs: 9.6 10*3/uL — ABNORMAL HIGH (ref 1.7–7.7)

## 2011-04-30 LAB — GLUCOSE, CAPILLARY
Glucose-Capillary: 343 mg/dL — ABNORMAL HIGH (ref 70–99)
Glucose-Capillary: 197 mg/dL — ABNORMAL HIGH (ref 70–99)

## 2011-04-30 LAB — COMPREHENSIVE METABOLIC PANEL
Albumin: 2.8 g/dL — ABNORMAL LOW (ref 3.5–5.2)
BUN: 37 mg/dL — ABNORMAL HIGH (ref 6–23)
Chloride: 102 mEq/L (ref 96–112)
Creatinine, Ser: 1.37 mg/dL — ABNORMAL HIGH (ref 0.50–1.10)
GFR calc Af Amer: 42 mL/min — ABNORMAL LOW (ref >90)
Glucose, Bld: 386 mg/dL — ABNORMAL HIGH (ref 70–99)
Total Bilirubin: 0.6 mg/dL (ref 0.3–1.2)
Total Protein: 5.4 g/dL — ABNORMAL LOW (ref 6.0–8.3)

## 2011-04-30 LAB — CBC
MCV: 86.8 fL (ref 78.0–100.0)
Platelets: 279 10*3/uL (ref 150–400)
RBC: 3.26 MIL/uL — ABNORMAL LOW (ref 3.87–5.11)
WBC: 13.3 10*3/uL — ABNORMAL HIGH (ref 4.0–10.5)

## 2011-04-30 MED ORDER — MOXIFLOXACIN HCL 400 MG PO TABS
400.0000 mg | ORAL_TABLET | Freq: Every day | ORAL | Status: DC
  Administered 2011-04-30: 400 mg via ORAL
  Filled 2011-04-30 (×2): qty 1

## 2011-04-30 MED ORDER — INSULIN ASPART 100 UNIT/ML ~~LOC~~ SOLN: 10.0000 [IU] | Freq: Once | SUBCUTANEOUS | Status: DC

## 2011-04-30 MED ORDER — INSULIN ASPART 100 UNIT/ML ~~LOC~~ SOLN
7.0000 [IU] | Freq: Once | SUBCUTANEOUS | Status: AC
  Administered 2011-04-30: 7 [IU] via SUBCUTANEOUS

## 2011-04-30 MED ORDER — INSULIN GLARGINE 100 UNIT/ML ~~LOC~~ SOLN
30.0000 [IU] | Freq: Two times a day (BID) | SUBCUTANEOUS | Status: DC
  Administered 2011-04-30: 30 [IU] via SUBCUTANEOUS

## 2011-04-30 NOTE — Progress Notes (Signed)
CRITICAL VALUE ALERT  Critical value received:  Glucose 755  Date of notification:  01.27.13  Time of notification:  2215  Critical value read back:yes   Nurse who received alert:  D. Buzzy Han, RN  MD notified (1st page):  Dr. Waynard Edwards  Time of first page:  2216  MD notified (2nd page):  Time of second page:  Responding MD:  Dr. Waynard Edwards  Time MD responded:  2218

## 2011-04-30 NOTE — Progress Notes (Signed)
Subjective: Patient looks much better. She is actually sitting up and interactive. She's walked all without but this layering and back. She is starting he is certainly more animated at this time. He said no nausea vomiting. He's had no cough or sputum. Blood sugars are remaining challenging and she's received multiple supplemental insulin doses.  Objective: Vital signs in last 24 hours: Temp:  [98.3 F (36.8 C)-98.9 F (37.2 C)] 98.3 F (36.8 C) (01/28 0500) Pulse Rate:  [69-81] 75  (01/28 0959) Resp:  [16-18] 18  (01/28 0500) BP: (118-144)/(53-65) 144/63 mmHg (01/28 0959) SpO2:  [96 %-99 %] 96 % (01/28 0500) Weight change:   CBG (last 3)   Basename 04/30/11 1057 04/30/11 0721 04/30/11 0356  GLUCAP 343* 403* 428*    Intake/Output from previous day: 01/27 0701 - 01/28 0700 In: 2324.2 [P.O.:960; I.V.:1214.2; IV Piggyback:150] Out: -   Physical Exam:  Patient is awake alert flat affect totally withdrawn. Lungs are clear. Cardiovascular exam regular rate and rhythm. Abdomen is soft and nontender. She is no peripheral edema. Neurologic exam is nonlateralizing   Lab Results:  Basename 04/30/11 0600 04/30/11 0133 04/29/11 0535  NA 135 -- 136  K 3.3* -- 3.6  CL 102 -- 104  CO2 20 -- 19  GLUCOSE 386* 672* --  BUN 37* -- 27*  CREATININE 1.37* -- 1.08  CALCIUM 9.0 -- 9.1  MG -- -- --  PHOS -- -- --    Basename 04/30/11 0600 04/29/11 0535  AST 8 9  ALT 8 9  ALKPHOS 74 85  BILITOT 0.6 0.7  PROT 5.4* 5.7*  ALBUMIN 2.8* 2.9*    Basename 04/30/11 0600 04/29/11 0535  WBC 13.3* 19.0*  NEUTROABS 9.6* 16.5*  HGB 9.5* 9.3*  HCT 28.3* 28.1*  MCV 86.8 88.9  PLT 279 276   Lab Results  Component Value Date   INR 1.04 04/25/2011   INR 0.96 01/07/2009    Basename 04/28/11 0850 04/27/11 2326 04/27/11 1614  CKTOTAL 87 130 159  CKMB 3.7 4.3* 4.7*  CKMBINDEX -- -- --  TROPONINI <0.30 <0.30 <0.30   No results found for this basename: TSH,T4TOTAL,FREET3,T3FREE,THYROIDAB in the  last 72 hours No results found for this basename: VITAMINB12:2,FOLATE:2,FERRITIN:2,TIBC:2,IRON:2,RETICCTPCT:2 in the last 72 hours  Studies/Results: No results found.   Assessment/Plan:  #1 diabetes mellitus type 2 with a protracted hypoglycemic episode currently more labile intensifying control  #2 persistent GI symptoms resolved unclear etiology gallbladder ultrasound and CT of abdomen negative  #3 left lower lobe pneumonia with leukocytosis currently on Zosyn  #4 essential hypertension slowly stabilizing ARB to be started  #5 coronary artery disease certainly no evidence of ischemia enzymes negative EKG okay    plan convert to orals home in the morning.     LOS: 5 days   Marice Angelino A 04/30/2011, 12:47 PM

## 2011-04-30 NOTE — Progress Notes (Signed)
Per Dr. Laurey Morale order, patient to get 10 Units of Novolog and give 10 am scheduled dose of Lantus now.  Will continue to monitor. Nolon Nations

## 2011-04-30 NOTE — Progress Notes (Signed)
CBG=540.  Dr. Waynard Edwards notified.  Novolog 7 units sub-q given per MD order.  Will continue to monitor.

## 2011-04-30 NOTE — Progress Notes (Signed)
   CARE MANAGEMENT NOTE 04/30/2011  Patient:  [name], [name]   Account Number:  [id number]  Date Initiated:  04/30/2011  Documentation initiated by:  Jamey Demchak  Subjective/Objective Assessment:   Order for HHRN, HHPT and HHOT.     Action/Plan:   Met with pt and daughter re HH needs, pt selected [facility] for HH needs, has DME, walker, cane BSC.   Anticipated DC Date:  05/01/2011   Anticipated DC Plan:  HOME W HOME HEALTH SERVICES         [facility] Choice  HOME HEALTH   Choice offered to / List presented to:  C-1 Patient        HH arranged  HH-1 RN  HH-2 PT  HH-3 OT      [facility] agency  [facility]   Status of service:  Completed, signed off Medicare Important Message given?   (If response is "NO", the following Medicare IM given date fields will be blank) Date Medicare IM given:   Date Additional Medicare IM given:    Discharge Disposition:  HOME W HOME HEALTH SERVICES  Per UR Regulation:    Comments:  04/30/2011 Pt selected [facility] for HH needs, has DME. CRoyal RN MPH, case manager, [phone]

## 2011-04-30 NOTE — Progress Notes (Signed)
Novolog insulin 17 units sub-q given per Dr. Laurey Morale order for glucose of 755.  Patient alert and oriented with no acute distress, no complaints voiced.  Will continue to monitor.

## 2011-05-01 LAB — COMPREHENSIVE METABOLIC PANEL
Albumin: 2.6 g/dL — ABNORMAL LOW (ref 3.5–5.2)
Alkaline Phosphatase: 65 U/L (ref 39–117)
BUN: 19 mg/dL (ref 6–23)
Creatinine, Ser: 0.92 mg/dL (ref 0.50–1.10)
GFR calc Af Amer: 68 mL/min — ABNORMAL LOW (ref >90)
Glucose, Bld: 46 mg/dL — ABNORMAL LOW (ref 70–99)
Potassium: 2.6 mEq/L — CL (ref 3.5–5.1)
Total Bilirubin: 0.3 mg/dL (ref 0.3–1.2)
Total Protein: 5.1 g/dL — ABNORMAL LOW (ref 6.0–8.3)

## 2011-05-01 LAB — GLUCOSE, CAPILLARY
Glucose-Capillary: 37 mg/dL — CL (ref 70–99)
Glucose-Capillary: 197 mg/dL — ABNORMAL HIGH (ref 70–99)

## 2011-05-01 LAB — DIFFERENTIAL
Basophils Absolute: 0 10*3/uL (ref 0.0–0.1)
Lymphocytes Relative: 24 % (ref 12–46)
Lymphs Abs: 2 10*3/uL (ref 0.7–4.0)
Neutro Abs: 5.5 10*3/uL (ref 1.7–7.7)

## 2011-05-01 LAB — CBC
HCT: 27.3 % — ABNORMAL LOW (ref 36.0–46.0)
MCV: 86.7 fL (ref 78.0–100.0)
Platelets: 296 10*3/uL (ref 150–400)
RBC: 3.15 MIL/uL — ABNORMAL LOW (ref 3.87–5.11)
RDW: 13.7 % (ref 11.5–15.5)
WBC: 8.6 10*3/uL (ref 4.0–10.5)

## 2011-05-01 MED ORDER — INSULIN ASPART 100 UNIT/ML ~~LOC~~ SOLN: 0.0000 [IU] | Freq: Three times a day (TID) | SUBCUTANEOUS | Status: DC

## 2011-05-01 MED ORDER — INSULIN GLARGINE 100 UNIT/ML ~~LOC~~ SOLN
6.0000 [IU] | Freq: Two times a day (BID) | SUBCUTANEOUS | Status: DC
Start: 2011-05-01 — End: 2011-05-01
  Administered 2011-05-01: 6 [IU] via SUBCUTANEOUS

## 2011-05-01 MED ORDER — MOXIFLOXACIN HCL 400 MG PO TABS: 400.0000 mg | ORAL_TABLET | Freq: Every day | ORAL | Status: AC

## 2011-05-01 MED ORDER — BD GETTING STARTED TAKE HOME KIT: 3/10ML X 30G SYRINGES
1.0000 | Freq: Once | Status: AC
  Administered 2011-05-01: 1
  Filled 2011-05-01: qty 1

## 2011-05-01 MED ORDER — POTASSIUM CHLORIDE CRYS ER 20 MEQ PO TBCR
40.0000 meq | EXTENDED_RELEASE_TABLET | Freq: Once | ORAL | Status: AC
  Administered 2011-05-01: 40 meq via ORAL
  Filled 2011-05-01: qty 2

## 2011-05-01 MED ORDER — LOSARTAN POTASSIUM 50 MG PO TABS: 50.0000 mg | ORAL_TABLET | Freq: Every day | ORAL | Status: DC

## 2011-05-01 MED ORDER — POTASSIUM CHLORIDE CRYS ER 20 MEQ PO TBCR
40.0000 meq | EXTENDED_RELEASE_TABLET | Freq: Two times a day (BID) | ORAL | Status: AC
  Administered 2011-05-01: 40 meq via ORAL

## 2011-05-01 MED ORDER — POTASSIUM CHLORIDE CRYS ER 20 MEQ PO TBCR
20.0000 meq | EXTENDED_RELEASE_TABLET | Freq: Two times a day (BID) | ORAL | Status: DC
  Filled 2011-05-01 (×2): qty 1

## 2011-05-01 MED ORDER — PANTOPRAZOLE SODIUM 40 MG PO TBEC: 40.0000 mg | DELAYED_RELEASE_TABLET | Freq: Every day | ORAL | Status: DC

## 2011-05-01 MED ORDER — SPIRONOLACTONE 50 MG PO TABS
50.0000 mg | ORAL_TABLET | Freq: Once | ORAL | Status: DC
  Filled 2011-05-01: qty 1

## 2011-05-01 NOTE — Discharge Summary (Signed)
DISCHARGE SUMMARY  SIMRAT KENDRICK  MR#: [id number]  DOB:[birth year]  Date of Admission: 04/25/2011 Date of Discharge: 05/01/2011  Attending Physician:Colbert Curenton A  Patient's PCP:No primary provider on file.  Consults:  none  Discharge Diagnoses: Active Problems:  Altered mental status secondary to hypoglycemia Diabetes mellitus type 1 labile overall excellent control at home Aspiration pneumonia Adjustment reaction Protracted nausea and vomiting with volume depletion secondary to gastritis Essential hypertension Hyperlipidemia   Discharge Medications: Medication List  As of 05/01/2011  8:40 AM   TAKE these medications         COMBIGAN 0.2-0.5 % ophthalmic solution   Generic drug: brimonidine-timolol   Place 1 drop into the left eye every 12 (twelve) hours.      HYDROcodone-acetaminophen 10-325 MG per tablet   Commonly known as: NORCO   Take 1 tablet by mouth every 4 (four) hours as needed. For pain.      indomethacin 25 MG capsule   Commonly known as: INDOCIN   Take 50 mg by mouth 2 (two) times daily with a meal.      insulin aspart 100 UNIT/ML injection   Commonly known as: novoLOG   Inject 0-9 Units into the skin 3 (three) times daily with meals.      insulin detemir 100 UNIT/ML injection   Commonly known as: LEVEMIR   Inject 6 Units into the skin 2 (two) times daily.      losartan 50 MG tablet   Commonly known as: COZAAR   Take 1 tablet (50 mg total) by mouth daily.      methocarbamol 500 MG tablet   Commonly known as: ROBAXIN   Take 500 mg by mouth 3 (three) times daily as needed. For muscle pain/spasms.      metoprolol 50 MG tablet   Commonly known as: LOPRESSOR   Take 50 mg by mouth 2 (two) times daily.      moxifloxacin 400 MG tablet   Commonly known as: AVELOX   Take 1 tablet (400 mg total) by mouth daily at 6 PM.      pantoprazole 40 MG tablet   Commonly known as: PROTONIX   Take 1 tablet (40 mg total) by mouth daily with breakfast.     simvastatin 40 MG tablet   Commonly known as: ZOCOR   Take 40 mg by mouth every evening.      temazepam 15 MG capsule   Commonly known as: RESTORIL   Take 15 mg by mouth at bedtime as needed. For sleep.            Hospital Procedures: Ct Abdomen Pelvis Wo Contrast  04/27/2011  *RADIOLOGY REPORT*  Clinical Data: Nausea and vomiting.  Abdominal pain  CT ABDOMEN AND PELVIS WITHOUT CONTRAST  Technique:  Multidetector CT imaging of the abdomen and pelvis was performed following the standard protocol without intravenous contrast.  Comparison: CT thorax 06/10/2006 there is  Findings: There  is fine air space disease/ground-glass opacities at the left lung base.  Calcified granuloma in the lingula.  No pleural fluid. No pericardial fluid.  Non-IV contrast images demonstrate no focal hepatic lesion.  The gallbladder, pancreas, spleen, adrenal glands, and kidneys are normal.  Stomach is normal.  Large hiatal hernia is present.  Small bowel and colon are normal.  Appendix is normal.  Abdominal aorta normal caliber.  No retroperitoneal periportal lymphadenopathy.  No free fluid in the pelvis.  The bladder is distended.  Post hysterectomy anatomy.  The left ovary is prominent measuring  3.3 x 2.1 cm.  No aggressive features.  No free fluid the pelvis.  No pelvic lymphadenopathy.  IMPRESSION:  1.  Air space disease in the left lower lobe.   Differential includes pneumonia versus pulmonary edema versus pneumonitis.  2.  Distended bladder.  3.  Large hiatal hernia.  Original Report Authenticated By: Genevive Bi, M.D.   Ct Head Wo Contrast  04/25/2011  *RADIOLOGY REPORT*  Clinical Data: Altered mental status.  CT HEAD WITHOUT CONTRAST  Technique:  Contiguous axial images were obtained from the base of the skull through the vertex without contrast.  Comparison: Brain MRI 07/18/2004.  Findings: There is no evidence of acute intracranial abnormality including acute infarction, hemorrhage, mass lesion, mass effect,  midline shift or abnormal extra-axial fluid collection.  No hydrocephalus or pneumocephalus.  Calvarium intact.  IMPRESSION: Negative exam.  Original Report Authenticated By: Bernadene Bell. Maricela Curet, M.D.   US Abdomen Complete  04/27/2011  *RADIOLOGY REPORT*  Clinical Data: Nausea.  ABDOMEN ULTRASOUND  Technique:  Complete abdominal ultrasound examination was performed including evaluation of the liver, gallbladder, bile ducts, pancreas, kidneys, spleen, IVC, and abdominal aorta.  Comparison: No comparison studies available.  Findings:  Gallbladder:  There is no evidence for gallstones.  No gallbladder wall thickening or pericholecystic fluid.  The sonographer reports no sonographic Murphy's sign.  Common Bile Duct:  Nondilated at 2 mm diameter.  Liver:  Normal.  No focal parenchymal abnormality.  No biliary dilation.  IVC:  Normal.  Pancreas:  Normal.  Spleen:  Normal.  Right kidney:  9.8 cm in long axis.  Normal.  Left kidney:  8.9 cm in long axis.  Normal.  Abdominal Aorta:  No aneurysm.  IMPRESSION: Unremarkable exam.  No findings to explain the patient's history of nausea.  Original Report Authenticated By: ERIC A. MANSELL, M.D.   Dg Chest Port 1 View  04/25/2011  *RADIOLOGY REPORT*  Clinical Data: Shortness of breath.  PORTABLE CHEST - 1 VIEW  Comparison: 01/07/2009  Findings: Atherosclerotic calcification of the aortic arch is present.  A 1.5 x 1.0 cm nodular density projects over the left lung base. Although this is at the intersection of two ribs and accordingly could possibly be from superimposition of vascular and osseous shadows or from rib callus, a neoplastic pulmonary nodule cannot be excluded, and CT the chest is recommended.  There is a vague nodularity in the right lung.  Heart size is within normal limits.  No pleural effusion is observed.  IMPRESSION:  1.  1.5 x 1.0 cm nodular density projects over the left lung base and could represent a neoplastic pulmonary nodule.  CT the chest is  recommended for further characterization 2.  Minimal nodularity in the right mid to lower lung. 3.  Atherosclerosis.  Original Report Authenticated By: Dellia Cloud, M.D.    History of Present Illness: [name] is a pleasant 78 yo female with history as noted below. She does occasionally have some low sugars in the afternoon time. However, she can always feel it coming on. Today her husband found her unresponsive and called the paramedics. Unfortunately he then had an MI and he is now in the ICU here. She was also brought to ER and found to be unresponsive with a cbg of 26. She was treated with two doses of d50. CCM was consulted but she has neurologically improved. Now she has no pain but feels a bit nauseated and has sbp of 200. She will be admitted to stepdown for further care.  Hospital Course: s he was initially admitted to the step down unit and ultimately transferred to the floor. Patient had a very difficult time given the sudden death and withdrawal of life support of her husband that same time she suffered this hypoglycemic event he wound up in the emergency room and hospitalized. Family was at the bedside and very supportive. Response to therapy was slow and difficult and very complicated. First, based upon CT scanning and ultrasound that were done to evaluate her GI maladies we discovered what is probably an aspiration pneumonia at the left base in the etiology was driving her white count. We did place her on antibiotics and she responded to this quite well. Further delaying care with protracted nausea vomiting and poor intake with negative radiographic workup but a nice response to treatment of the pneumonia and appear proton pump inhibitors. In the interim she was supported with IV fluids. Hypertension was difficult but responded to the addition of losartan. Blood sugars were quite labile and resistant to insulin and only in last 24 hours have a responded most likely because of the  overall response to the pneumonia and insulin resistance. I had an extensive discussion with she and her family about my concerns about returning home and they're ranging some help at the house at least initially particularly to watch her blood sugars. She is eating well with no GI symptoms. Blood sugars and blood pressure has stabilized. White blood cell count is declining. Exam has been normal. She is discharged in improved condition with a guarded prognosis largely because of her adjustment reaction to her husband's death.  Day of Discharge Exam BP 160/71  Pulse 66  Temp(Src) 98.7 F (37.1 C) (Oral)  Resp 16  Ht 5\' 5"  (1.651 m)  Wt 70.761 kg (156 lb)  BMI 25.96 kg/m2  SpO2 97%  Physical Exam: General appearance: alert, cooperative, appears stated age and no distress Eyes: no scleral icterus Throat: oropharynx moist without erythema Resp: clear to auscultation bilaterally Cardio: regular rate and rhythm, S1, S2 normal, no murmur, click, rub or gallop Extremities: no clubbing, cyanosis or edema  Discharge Labs:  [facility] 05/01/11 0520 04/30/11 0600  NA 142 135  K 2.6* 3.3*  CL 109 102  CO2 25 20  GLUCOSE 46* 386*  BUN 19 37*  CREATININE 0.92 1.37*  CALCIUM 8.6 9.0  MG -- --  PHOS -- --    Basename 05/01/11 0520 04/30/11 0600  AST 12 8  ALT 8 8  ALKPHOS 65 74  BILITOT 0.3 0.6  PROT 5.1* 5.4*  ALBUMIN 2.6* 2.8*    Basename 05/01/11 0520 04/30/11 0600  WBC 8.6 13.3*  NEUTROABS 5.5 9.6*  HGB 9.2* 9.5*  HCT 27.3* 28.3*  MCV 86.7 86.8  PLT 296 279    Basename 04/28/11 0850  CKTOTAL 87  CKMB 3.7  CKMBINDEX --  TROPONINI <0.30   No results found for this basename: TSH,T4TOTAL,FREET3,T3FREE,THYROIDAB in the last 72 hours No results found for this basename: VITAMINB12:2,FOLATE:2,FERRITIN:2,TIBC:2,IRON:2,RETICCTPCT:2 in the last 72 hours  Discharge instructions: Discharge Orders    Future Orders Please Complete By Expires   Diet - low sodium heart healthy       Increase activity slowly      Discharge instructions      Scheduling Instructions:   Close monitoring of blood sugars at home and return to her basic home regimen of Levemir and NovoLog as she was doing. Be certain where eating well and monitoring at least every 2 hours  to include awakening at 2 AM to check at least a couple times through the night.   Comments:   Compliance with monitoring of blood sugars and home insulin       Disposition: Home  Follow-up Appts: Follow-up with Dr. Jacky Kindle at [facility] in 1 week.  Call for appointment.  Condition on Discharge: Improved  Tests Needing Follow-up: None  Signed: Lekisha Mcghee A 05/01/2011, 8:40 AM

## 2011-05-01 NOTE — Progress Notes (Signed)
pts K 2.6 and CBG 37; pt awake and oriented, no complaints;Dr.Paterson paged and made aware, new orders received; pt eating breakfast and CBG recheck 94; will continue to monitor

## 2011-05-01 NOTE — Progress Notes (Signed)
05-01-11 UR completed. Caral Whan RN BSN  

## 2011-05-01 NOTE — Progress Notes (Signed)
D/c orders received; IV removed with gauze on; pt remains in stable condition; went over administration of insulin with daughter and how to take pts CBG; daughter able to demonstrate how to take CBG and draw up and administer insulin; printouts for insulin injections for adults, blood sugar monitoring, and insulin storage and care reviewed and given to pt and pts daughter. Meds and information reviewed and given to pt; all pt and pts family questions answered. Pt d/c home

## 2011-06-20 ENCOUNTER — Emergency Department (HOSPITAL_COMMUNITY): Payer: Medicare Other

## 2011-06-20 ENCOUNTER — Other Ambulatory Visit: Payer: Self-pay

## 2011-06-20 ENCOUNTER — Encounter (HOSPITAL_COMMUNITY): Payer: Self-pay | Admitting: *Deleted

## 2011-06-20 ENCOUNTER — Emergency Department (HOSPITAL_COMMUNITY)
Admission: EM | Admit: 2011-06-20 | Discharge: 2011-06-20 | Disposition: A | Discharge status: Home / Self Care | Payer: Medicare Other | Attending: Emergency Medicine | Admitting: Emergency Medicine

## 2011-06-20 DIAGNOSIS — Z794 Long term (current) use of insulin: Secondary | ICD-10-CM | POA: Insufficient documentation

## 2011-06-20 DIAGNOSIS — Z79899 Other long term (current) drug therapy: Secondary | ICD-10-CM | POA: Insufficient documentation

## 2011-06-20 DIAGNOSIS — K219 Gastro-esophageal reflux disease without esophagitis: Secondary | ICD-10-CM | POA: Insufficient documentation

## 2011-06-20 DIAGNOSIS — S1093XA Contusion of unspecified part of neck, initial encounter: Secondary | ICD-10-CM | POA: Insufficient documentation

## 2011-06-20 DIAGNOSIS — W19XXXA Unspecified fall, initial encounter: Secondary | ICD-10-CM

## 2011-06-20 DIAGNOSIS — I10 Essential (primary) hypertension: Secondary | ICD-10-CM | POA: Insufficient documentation

## 2011-06-20 DIAGNOSIS — I951 Orthostatic hypotension: Secondary | ICD-10-CM | POA: Insufficient documentation

## 2011-06-20 DIAGNOSIS — S0990XA Unspecified injury of head, initial encounter: Secondary | ICD-10-CM | POA: Insufficient documentation

## 2011-06-20 DIAGNOSIS — E119 Type 2 diabetes mellitus without complications: Secondary | ICD-10-CM | POA: Insufficient documentation

## 2011-06-20 DIAGNOSIS — Z8639 Personal history of other endocrine, nutritional and metabolic disease: Secondary | ICD-10-CM | POA: Insufficient documentation

## 2011-06-20 DIAGNOSIS — IMO0002 Reserved for concepts with insufficient information to code with codable children: Secondary | ICD-10-CM | POA: Insufficient documentation

## 2011-06-20 DIAGNOSIS — Z8673 Personal history of transient ischemic attack (TIA), and cerebral infarction without residual deficits: Secondary | ICD-10-CM | POA: Insufficient documentation

## 2011-06-20 DIAGNOSIS — M542 Cervicalgia: Secondary | ICD-10-CM | POA: Insufficient documentation

## 2011-06-20 DIAGNOSIS — K589 Irritable bowel syndrome without diarrhea: Secondary | ICD-10-CM | POA: Insufficient documentation

## 2011-06-20 DIAGNOSIS — Z862 Personal history of diseases of the blood and blood-forming organs and certain disorders involving the immune mechanism: Secondary | ICD-10-CM | POA: Insufficient documentation

## 2011-06-20 DIAGNOSIS — W1809XA Striking against other object with subsequent fall, initial encounter: Secondary | ICD-10-CM | POA: Insufficient documentation

## 2011-06-20 DIAGNOSIS — R51 Headache: Secondary | ICD-10-CM | POA: Insufficient documentation

## 2011-06-20 DIAGNOSIS — R9431 Abnormal electrocardiogram [ECG] [EKG]: Secondary | ICD-10-CM | POA: Insufficient documentation

## 2011-06-20 DIAGNOSIS — E785 Hyperlipidemia, unspecified: Secondary | ICD-10-CM | POA: Insufficient documentation

## 2011-06-20 DIAGNOSIS — I2510 Atherosclerotic heart disease of native coronary artery without angina pectoris: Secondary | ICD-10-CM | POA: Insufficient documentation

## 2011-06-20 DIAGNOSIS — M129 Arthropathy, unspecified: Secondary | ICD-10-CM | POA: Insufficient documentation

## 2011-06-20 DIAGNOSIS — R42 Dizziness and giddiness: Secondary | ICD-10-CM | POA: Insufficient documentation

## 2011-06-20 DIAGNOSIS — S0003XA Contusion of scalp, initial encounter: Secondary | ICD-10-CM | POA: Insufficient documentation

## 2011-06-20 DIAGNOSIS — Y92009 Unspecified place in unspecified non-institutional (private) residence as the place of occurrence of the external cause: Secondary | ICD-10-CM | POA: Insufficient documentation

## 2011-06-20 LAB — URINALYSIS, ROUTINE W REFLEX MICROSCOPIC
Bilirubin Urine: NEGATIVE
Hgb urine dipstick: NEGATIVE
Ketones, ur: NEGATIVE mg/dL
Nitrite: NEGATIVE
Urobilinogen, UA: 0.2 mg/dL (ref 0.0–1.0)

## 2011-06-20 LAB — BASIC METABOLIC PANEL
BUN: 21 mg/dL (ref 6–23)
Calcium: 9.6 mg/dL (ref 8.4–10.5)
Creatinine, Ser: 0.88 mg/dL (ref 0.50–1.10)
GFR calc Af Amer: 72 mL/min — ABNORMAL LOW (ref >90)
GFR calc non Af Amer: 62 mL/min — ABNORMAL LOW (ref >90)
Potassium: 4.7 mEq/L (ref 3.5–5.1)

## 2011-06-20 LAB — CBC
HCT: 35.5 % — ABNORMAL LOW (ref 36.0–46.0)
MCH: 29.4 pg (ref 26.0–34.0)
MCHC: 32.7 g/dL (ref 30.0–36.0)
MCV: 89.9 fL (ref 78.0–100.0)
RDW: 14.2 % (ref 11.5–15.5)

## 2011-06-20 MED ORDER — SODIUM CHLORIDE 0.9 % IV SOLN
Freq: Once | INTRAVENOUS | Status: AC
  Administered 2011-06-20: 15:00:00 via INTRAVENOUS

## 2011-06-20 MED ORDER — SODIUM CHLORIDE 0.9 % IV BOLUS (SEPSIS)
500.0000 mL | Freq: Once | INTRAVENOUS | Status: AC
  Administered 2011-06-20: 1000 mL via INTRAVENOUS

## 2011-06-20 NOTE — ED Notes (Signed)
Pt states she fell last night on the porch, unsure if she lost balance or was dizzy. Pt with hematoma noted to left side of scalp with dried blood. Pt is alert and oriented. Pupils equal and reactive. Pt reports mild headache and reports neck discomfort. Will place c-collar.

## 2011-06-20 NOTE — ED Notes (Signed)
DR ARONSON HAS ARRIVED TO SEE THE PT. HE IS CURRENTLY REVIEWIING PT RESULTS

## 2011-06-20 NOTE — ED Notes (Signed)
Heart Healthy Tray Ordered  

## 2011-06-20 NOTE — ED Notes (Signed)
Onset 1800 one day ago outside sitting down stood up witnessed by Grandson fell backwards no LOC hematoma posterior head no bleeding present today.  States headache 2/10 throbbing and posterior neck 6/10 achy ax4 moves all extremities bilateral equal and strong. Airway intact bilateral equal chest rise and fall.  Grandson at bedside.

## 2011-06-20 NOTE — H&P (Signed)
PCP:   Minda Meo, MD, MD   Chief Complaint:  Near syncope/fall  HPI: 1 native Buer is a 5 are old well-known to me with a history of hypertension, remote coronary disease, diabetes mellitus type 1 and recent admission with profound hypoglycemic episode of aspiration pneumonia who presented to the emergency room today after an orthostatic fall while outside and join the night air and her grandchild. She is a chronic history of orthostasis and has known diabetic neuropathy; as she states, she should have known better there and she chronically gets orthostatic and positional dizziness with quick changes in position. Last evening while out of the patio she got up quickly from the chair, got dizzy and fell backwards striking her head. She had no loss of consciousness and has had nothing beyond some mild local tenderness and pain. She had no nausea or vomiting. He's had no chest pain or shortness of breath. She's had no neurologic changes. She comes to the emergency room this morning for evaluation well hours after the event. It was called by the emergency room staff to evaluate her because of concerns of orthostasis and EKG changes albeit she is having no chest or cardiac symptoms and clearly feels well back to baseline at this time. Additionally, she's had no excessive low or elevated blood sugars and she has recovered nicely from her pulmonary issues during the last admission.  Review of Systems:  The patient denies anorexia, fever, weight loss,, vision loss, decreased hearing, hoarseness, chest pain, syncope, dyspnea on exertion, peripheral edema, balance deficits, hemoptysis, abdominal pain, melena, hematochezia, severe indigestion/heartburn, hematuria, incontinence, genital sores, muscle weakness, suspicious skin lesions, transient blindness, difficulty walking, depression, unusual weight change, abnormal bleeding, enlarged lymph nodes, angioedema, and breast masses. Does relate chronic  positional dizziness which is unchanged and decreased sensation involving her feet.  Past Medical History: Past Medical History  Diagnosis Date  . Diabetes mellitus   . Hypertension   . Coronary artery disease   . Gout   . GERD (gastroesophageal reflux disease)   . Stroke   . Arthritis   . Diverticulosis   . Gout   . Hyperlipemia   . IBS (irritable bowel syndrome)    Past Surgical History  Procedure Date  . Back surgery   . Appendectomy   . Abdominal hysterectomy   . Cataract surgery     Medications: Prior to Admission medications   Medication Sig Start Date End Date Taking? Authorizing Provider  brimonidine-timolol (COMBIGAN) 0.2-0.5 % ophthalmic solution Place 1 drop into the left eye every 12 (twelve) hours.   Yes Historical Provider, MD  ciprofloxacin (CIPRO) 500 MG tablet Take 500 mg by mouth 2 (two) times daily. For 7 days started on 06/16/11   Yes Historical Provider, MD  HYDROcodone-acetaminophen (NORCO) 10-325 MG per tablet Take 1 tablet by mouth every 4 (four) hours as needed. For pain.   Yes Historical Provider, MD  insulin aspart (NOVOLOG) 100 UNIT/ML injection Inject into the skin 3 (three) times daily before meals. Sliding scale   Yes Historical Provider, MD  insulin detemir (LEVEMIR) 100 UNIT/ML injection Inject 6 Units into the skin 2 (two) times daily.   Yes Historical Provider, MD  losartan (COZAAR) 50 MG tablet Take 1 tablet (50 mg total) by mouth daily. 05/01/11 04/30/12 Yes Minda Meo, MD  methocarbamol (ROBAXIN) 500 MG tablet Take 500 mg by mouth 3 (three) times daily as needed. For muscle pain/spasms.   Yes Historical Provider, MD  metoprolol (LOPRESSOR) 50  MG tablet Take 50 mg by mouth 2 (two) times daily.   Yes Historical Provider, MD  pantoprazole (PROTONIX) 40 MG tablet Take 1 tablet (40 mg total) by mouth daily with breakfast. 05/01/11 04/30/12 Yes Minda Meo, MD  simvastatin (ZOCOR) 40 MG tablet Take 40 mg by mouth every evening.   Yes  Historical Provider, MD  temazepam (RESTORIL) 15 MG capsule Take 15 mg by mouth at bedtime as needed. For sleep.   Yes Historical Provider, MD    Allergies:   Allergies  Allergen Reactions  . Ivp Dye (Iodinated Diagnostic Agents) Hives  . Keflex Hives    Social History:  reports that she has never smoked. She has never used smokeless tobacco. She reports that she does not drink alcohol or use illicit drugs.he is recently widowed. Son and grandchild living with her.  Family History: History reviewed. No pertinent family history.  Physical Exam: Filed Vitals:   06/20/11 1310 06/20/11 1311 06/20/11 1746 06/20/11 1755  BP: 135/73 101/62 141/66 141/66  Pulse: 83 90  73  Temp:      TempSrc:      Resp:    18  SpO2:    100%   General appearance: alert, cooperative, appears stated age and no distress Head: Normocephalic, without obvious abnormality, atraumatic Eyes: conjunctivae/corneas clear. PERRL, EOM's intact.  Nose: Nares normal. Septum midline. Mucosa normal. No drainage or sinus tenderness. Throat: lips, mucosa, and tongue normal; teeth and gums normal Neck: no adenopathy, no carotid bruit, no JVD and thyroid not enlarged, symmetric, no tenderness/mass/nodules Resp: clear to auscultation bilaterally Cardio: regular rate and rhythm, S1, S2 normal, no murmur, click, rub or gallop GI: soft, non-tender; bowel sounds normal; no masses,  no organomegaly Extremities: extremities normal, atraumatic, no cyanosis or edema Pulses: 2+ and symmetric Lymph nodes: Cervical adenopathy: no cervical lymphadenopathy Neurologic: Alert and oriented X 3, normal strength and tone. Normal symmetric reflexes.     Labs on Admission:   [facility] 06/20/11 1317  NA 137  K 4.7  CL 104  CO2 25  GLUCOSE 110*  BUN 21  CREATININE 0.88  CALCIUM 9.6  MG --  PHOS --   No results found for this basename: AST:2,ALT:2,ALKPHOS:2,BILITOT:2,PROT:2,ALBUMIN:2 in the last 72 hours No results found for this  basename: LIPASE:2,AMYLASE:2 in the last 72 hours  Basename 06/20/11 1317  WBC 8.3  NEUTROABS --  HGB 11.6*  HCT 35.5*  MCV 89.9  PLT 295   No results found for this basename: CKTOTAL:3,CKMB:3,CKMBINDEX:3,TROPONINI:3 in the last 72 hours No results found for this basename: TSH,T4TOTAL,FREET3,T3FREE,THYROIDAB in the last 72 hours No results found for this basename: VITAMINB12:2,FOLATE:2,FERRITIN:2,TIBC:2,IRON:2,RETICCTPCT:2 in the last 72 hours  Radiological Exams on Admission: Dg Chest 2 View  06/20/2011  *RADIOLOGY REPORT*  Clinical Data: Recent fall  CHEST - 2 VIEW  Comparison: Chest x-ray of 04/25/2011  Findings: No active infiltrate or effusion is seen.  There is some peribronchial thickening which may indicate bronchitis.  However, on the lateral view there is a nodular opacity anteriorly, not seen on the frontal view.  Either follow-up chest x-ray or unenhanced CT of the chest is recommended to assess further.  Mediastinal contours appear normal.  The heart is mildly enlarged.  The bones are osteopenic.  IMPRESSION:  1.  No definite active process.  Probable bronchitis. 2.  Vague nodular opacity seen only on the lateral view.  Consider follow-up chest x-ray or CT of the chest to assess further.  Original Report Authenticated By: Colon Flattery.  Gery Pray, M.D.   Ct Head Wo Contrast  06/20/2011  *RADIOLOGY REPORT*  Clinical Data:  Fall.  Head injury.  CT HEAD WITHOUT CONTRAST CT CERVICAL SPINE WITHOUT CONTRAST  Technique:  Multidetector CT imaging of the head and cervical spine was performed following the standard protocol without intravenous contrast.  Multiplanar CT image reconstructions of the cervical spine were also generated.  Comparison:  CT head without contrast 04/25/2011.  CT HEAD  Findings: No acute infarct, hemorrhage, or mass lesion is present. The ventricles are of normal size.  There is no significant extra- axial fluid collection.  Soft tissue swelling is present in the left parietal  scalp.  There is no underlying fracture.  There is some fluid in the inferior mastoid air cells bilaterally.  No associated fracture is present. There is no obstructing nasopharyngeal lesion.  The paranasal sinuses are clear.  IMPRESSION:  1.  Normal CT appearance of the brain. 2.  Left parietal scalp soft tissue swelling without underlying fracture. 3.  Minimal mastoid fluid.  No obstructing nasopharyngeal lesion or fracture is present.  The  CT CERVICAL SPINE  Findings: The cervical spine is imaged from the skull base through the midbody of T3.  Degenerative anterolisthesis at C3-4 measures 2 mm.  There is chronic loss of disc height at C4-5, C5-6, and C6-7. Uncovertebral disease is present due to these levels.  It is most severe at C4-5 and C5-6 with bilateral foraminal stenosis at C4-5, worse on the left.  Right greater than left foraminal stenosis is present at C5-6.  No acute fracture or traumatic subluxation is evident.  The soft tissues demonstrate atherosclerotic calcifications at the carotid bifurcations bilaterally.  Emphysematous changes are noted at the lung apices.  IMPRESSION:  1.  Moderate spondylosis of the cervical spine. 2.  No acute fracture or traumatic subluxation. 3.  Emphysema.  Original Report Authenticated By: Jamesetta Orleans. MATTERN, M.D.   Ct Cervical Spine Wo Contrast  06/20/2011  *RADIOLOGY REPORT*  Clinical Data:  Fall.  Head injury.  CT HEAD WITHOUT CONTRAST CT CERVICAL SPINE WITHOUT CONTRAST  Technique:  Multidetector CT imaging of the head and cervical spine was performed following the standard protocol without intravenous contrast.  Multiplanar CT image reconstructions of the cervical spine were also generated.  Comparison:  CT head without contrast 04/25/2011.  CT HEAD  Findings: No acute infarct, hemorrhage, or mass lesion is present. The ventricles are of normal size.  There is no significant extra- axial fluid collection.  Soft tissue swelling is present in the left parietal  scalp.  There is no underlying fracture.  There is some fluid in the inferior mastoid air cells bilaterally.  No associated fracture is present. There is no obstructing nasopharyngeal lesion.  The paranasal sinuses are clear.  IMPRESSION:  1.  Normal CT appearance of the brain. 2.  Left parietal scalp soft tissue swelling without underlying fracture. 3.  Minimal mastoid fluid.  No obstructing nasopharyngeal lesion or fracture is present.  The  CT CERVICAL SPINE  Findings: The cervical spine is imaged from the skull base through the midbody of T3.  Degenerative anterolisthesis at C3-4 measures 2 mm.  There is chronic loss of disc height at C4-5, C5-6, and C6-7. Uncovertebral disease is present due to these levels.  It is most severe at C4-5 and C5-6 with bilateral foraminal stenosis at C4-5, worse on the left.  Right greater than left foraminal stenosis is present at C5-6.  No acute fracture or traumatic subluxation is  evident.  The soft tissues demonstrate atherosclerotic calcifications at the carotid bifurcations bilaterally.  Emphysematous changes are noted at the lung apices.  IMPRESSION:  1.  Moderate spondylosis of the cervical spine. 2.  No acute fracture or traumatic subluxation. 3.  Emphysema.  Original Report Authenticated By: Jamesetta Orleans. MATTERN, M.D.   Orders placed during the hospital encounter of 06/20/11  . ED EKG  . ED EKG    Assessment/Plan #1 fall with scalp hematoma no intracranial abnormality no neurologic changes resuscitated by some mild orthostasis which is somewhat chronic largely related medications and possible peripheral neuropathy. She is clearly at baseline. EKG changes are chronic and nonspecific they tend to wax and wane depending upon her EKG based upon old hospital EKGs and office EKGs clearly no concomitant cardiac symptoms. This event was closed precipitated by a quick change in position not underlying cardiac event.  #2 diabetes mellitus type 1 under excellent control  no further hypoglycemia  #3 essential hypertension with some orthostasis will encourage her to push fluids, monitor home blood pressures and further as an outpatient  #4 nonobstructive coronary artery disease by history no evidence of an event at this time for concern  #5 essential aspiration pneumonia resolved  Plan Asian is to be discharged home to care of her family. We'll encourage fluids, close monitoring of blood sugar and blood pressures further as an outpatient.   Alexie Lanni A 06/20/2011, 6:15 PM

## 2011-06-20 NOTE — ED Provider Notes (Signed)
History     CSN: [id number]  Arrival date & time 06/20/11  1026   First MD Initiated Contact with Patient 06/20/11 1133      Chief Complaint  Patient presents with  . Fall    (Consider location/radiation/quality/duration/timing/severity/associated sxs/prior treatment) The history is provided by the patient and a relative.  [name] is a 78 y/o F with a PMH of HTN, CAD and DM who presents to the ED with c/c of fall last night with head injury. Family member reports that patient has had trouble with dizziness upon standing for several months that has led to several falls. Her dizziness has been getting worse recently. Last night, she felt dizzy when standing on her porch and fell backward, striking her head on the ground. There was no loss of consciousness, though she has had a mild HA and neck pain since the fall. She denies change in vision from her normal, tinnitus, vomiting, drowsiness or behavior change, change in her ability to ambulate, or numbness/weakness to the extremities. She was treated with a c-collar placed by triage nurse prior to my assessment.   Past Medical History  Diagnosis Date  . Diabetes mellitus   . Hypertension   . Coronary artery disease   . Gout   . GERD (gastroesophageal reflux disease)   . Stroke   . Arthritis   . Diverticulosis   . Gout   . Hyperlipemia   . IBS (irritable bowel syndrome)     Past Surgical History  Procedure Date  . Back surgery   . Appendectomy   . Abdominal hysterectomy   . Cataract surgery     History reviewed. No pertinent family history.  History  Substance Use Topics  . Smoking status: Never Smoker   . Smokeless tobacco: Never Used  . Alcohol Use: No          Review of Systems 10 systems reviewed and are negative for acute change except as noted in the HPI.  Allergies  Ivp dye and Keflex  Home Medications   Current Outpatient Rx  Name Route Sig Dispense Refill  . BRIMONIDINE TARTRATE-TIMOLOL 0.2-0.5 %  OP SOLN Left Eye Place 1 drop into the left eye every 12 (twelve) hours.    Marland Kitchen CIPROFLOXACIN HCL 500 MG PO TABS Oral Take 500 mg by mouth 2 (two) times daily. For 7 days started on 06/16/11    . HYDROCODONE-ACETAMINOPHEN 10-325 MG PO TABS Oral Take 1 tablet by mouth every 4 (four) hours as needed. For pain.    . INSULIN ASPART 100 UNIT/ML [place] SOLN Subcutaneous Inject into the skin 3 (three) times daily before meals. Sliding scale    . INSULIN DETEMIR 100 UNIT/ML  SOLN Subcutaneous Inject 6 Units into the skin 2 (two) times daily.    Marland Kitchen LOSARTAN POTASSIUM 50 MG PO TABS Oral Take 1 tablet (50 mg total) by mouth daily. 30 tablet 11  . METHOCARBAMOL 500 MG PO TABS Oral Take 500 mg by mouth 3 (three) times daily as needed. For muscle pain/spasms.    Marland Kitchen METOPROLOL TARTRATE 50 MG PO TABS Oral Take 50 mg by mouth 2 (two) times daily.    Marland Kitchen PANTOPRAZOLE SODIUM 40 MG PO TBEC Oral Take 1 tablet (40 mg total) by mouth daily with breakfast. 30 tablet 0  . SIMVASTATIN 40 MG PO TABS Oral Take 40 mg by mouth every evening.    Marland Kitchen TEMAZEPAM 15 MG PO CAPS Oral Take 15 mg by mouth at bedtime as needed.  For sleep.      BP 101/62  Pulse 90  Temp(Src) 98.1 F (36.7 C) (Oral)  Resp 16  SpO2 100%  Physical Exam  Nursing note and vitals reviewed. Constitutional: She is oriented to person, place, and time. She appears well-developed and well-nourished. No distress.  HENT:  Head: Normocephalic.    Right Ear: External ear normal.  Left Ear: External ear normal.  Mouth/Throat: Oropharynx is clear and moist.  Eyes: Conjunctivae and EOM are normal. Pupils are equal, round, and reactive to light.  Neck:       c-collar in place  Cardiovascular: Normal rate, regular rhythm, normal heart sounds and intact distal pulses.   Pulmonary/Chest: Effort normal and breath sounds normal. No respiratory distress. She has no wheezes. She has no rales. She exhibits no tenderness.  Abdominal: Soft. Bowel sounds are normal. She exhibits  no distension. There is no tenderness.  Musculoskeletal: She exhibits no edema and no tenderness.  Neurological: She is alert and oriented to person, place, and time. No cranial nerve deficit. Abnormal coordination: F-N intact bilaterally.       Bilateral grip strength, major muscle groups in upper and lower extremities symmetric and 5/5. Pt does not tolerate ambulation due to sensation of dizziness on standing, though she can stand in one place. Negative rhomberg, no pronator drift.  Skin: Skin is warm and dry.  Psychiatric: She has a normal mood and affect.    ED Course  Procedures (including critical care time)  Labs Reviewed  CBC - Abnormal; Notable for the following:    Hemoglobin 11.6 (*)    HCT 35.5 (*)    All other components within normal limits  BASIC METABOLIC PANEL  URINALYSIS, ROUTINE W REFLEX MICROSCOPIC   Dg Chest 2 View  06/20/2011  *RADIOLOGY REPORT*  Clinical Data: Recent fall  CHEST - 2 VIEW  Comparison: Chest x-ray of 04/25/2011  Findings: No active infiltrate or effusion is seen.  There is some peribronchial thickening which may indicate bronchitis.  However, on the lateral view there is a nodular opacity anteriorly, not seen on the frontal view.  Either follow-up chest x-ray or unenhanced CT of the chest is recommended to assess further.  Mediastinal contours appear normal.  The heart is mildly enlarged.  The bones are osteopenic.  IMPRESSION:  1.  No definite active process.  Probable bronchitis. 2.  Vague nodular opacity seen only on the lateral view.  Consider follow-up chest x-ray or CT of the chest to assess further.  Original Report Authenticated By: Juline Patch, M.D.   Ct Head Wo Contrast  06/20/2011  *RADIOLOGY REPORT*  Clinical Data:  Fall.  Head injury.  CT HEAD WITHOUT CONTRAST CT CERVICAL SPINE WITHOUT CONTRAST  Technique:  Multidetector CT imaging of the head and cervical spine was performed following the standard protocol without intravenous contrast.   Multiplanar CT image reconstructions of the cervical spine were also generated.  Comparison:  CT head without contrast 04/25/2011.  CT HEAD  Findings: No acute infarct, hemorrhage, or mass lesion is present. The ventricles are of normal size.  There is no significant extra- axial fluid collection.  Soft tissue swelling is present in the left parietal scalp.  There is no underlying fracture.  There is some fluid in the inferior mastoid air cells bilaterally.  No associated fracture is present. There is no obstructing nasopharyngeal lesion.  The paranasal sinuses are clear.  IMPRESSION:  1.  Normal CT appearance of the brain. 2.  Left  parietal scalp soft tissue swelling without underlying fracture. 3.  Minimal mastoid fluid.  No obstructing nasopharyngeal lesion or fracture is present.  The  CT CERVICAL SPINE  Findings: The cervical spine is imaged from the skull base through the midbody of T3.  Degenerative anterolisthesis at C3-4 measures 2 mm.  There is chronic loss of disc height at C4-5, C5-6, and C6-7. Uncovertebral disease is present due to these levels.  It is most severe at C4-5 and C5-6 with bilateral foraminal stenosis at C4-5, worse on the left.  Right greater than left foraminal stenosis is present at C5-6.  No acute fracture or traumatic subluxation is evident.  The soft tissues demonstrate atherosclerotic calcifications at the carotid bifurcations bilaterally.  Emphysematous changes are noted at the lung apices.  IMPRESSION:  1.  Moderate spondylosis of the cervical spine. 2.  No acute fracture or traumatic subluxation. 3.  Emphysema.  Original Report Authenticated By: Jamesetta Orleans. MATTERN, M.D.   Ct Cervical Spine Wo Contrast  06/20/2011  *RADIOLOGY REPORT*  Clinical Data:  Fall.  Head injury.  CT HEAD WITHOUT CONTRAST CT CERVICAL SPINE WITHOUT CONTRAST  Technique:  Multidetector CT imaging of the head and cervical spine was performed following the standard protocol without intravenous contrast.   Multiplanar CT image reconstructions of the cervical spine were also generated.  Comparison:  CT head without contrast 04/25/2011.  CT HEAD  Findings: No acute infarct, hemorrhage, or mass lesion is present. The ventricles are of normal size.  There is no significant extra- axial fluid collection.  Soft tissue swelling is present in the left parietal scalp.  There is no underlying fracture.  There is some fluid in the inferior mastoid air cells bilaterally.  No associated fracture is present. There is no obstructing nasopharyngeal lesion.  The paranasal sinuses are clear.  IMPRESSION:  1.  Normal CT appearance of the brain. 2.  Left parietal scalp soft tissue swelling without underlying fracture. 3.  Minimal mastoid fluid.  No obstructing nasopharyngeal lesion or fracture is present.  The  CT CERVICAL SPINE  Findings: The cervical spine is imaged from the skull base through the midbody of T3.  Degenerative anterolisthesis at C3-4 measures 2 mm.  There is chronic loss of disc height at C4-5, C5-6, and C6-7. Uncovertebral disease is present due to these levels.  It is most severe at C4-5 and C5-6 with bilateral foraminal stenosis at C4-5, worse on the left.  Right greater than left foraminal stenosis is present at C5-6.  No acute fracture or traumatic subluxation is evident.  The soft tissues demonstrate atherosclerotic calcifications at the carotid bifurcations bilaterally.  Emphysematous changes are noted at the lung apices.  IMPRESSION:  1.  Moderate spondylosis of the cervical spine. 2.  No acute fracture or traumatic subluxation. 3.  Emphysema.  Original Report Authenticated By: Jamesetta Orleans. MATTERN, M.D.    Date: 06/20/2011  Rate: 76  Rhythm: normal sinus rhythm  QRS Axis: normal  Intervals: normal  ST/T Wave abnormalities: TWI inferiorly, laterally  Conduction Disutrbances:none  Narrative Interpretation: prior ECG dated 04/27/2011-today's EKG with new TWI and new q-waves inferiorly and laterally  Old  EKG Reviewed: changes noted    1. Fall   2. Head injury   3. Orthostasis   4. T wave inversion in EKG       MDM  11:50 AM Pt has been seen and evaluated. Initial H&P completed. Frequent falls. Pt A&O with no LOC. CT scan head and c-spine ordered. Orthostatic vital  signs will be obtained. Will continue to monitor.  1:00 PM Pt without acute findings on CT scan of head or c-spine. Anterior nodular opacity on CXR lateral view. TWI EKG changes since prior tracing 2 months ago. Will plan for admit, basic labs ordered.   3:10 PM Labs relatively unremarkable, stable anemia. Positive orthostasis. Fluid bolus was ordered. [facility] to be consulted for admission as pt primary is Dr Jacky Kindle.        Shaaron Adler, PA-C 06/20/11 1620

## 2011-06-20 NOTE — ED Notes (Signed)
DR ARONSON HAS REVIEWED HIS DISCHARGE PLAN WITH PT AND FAMILY. PA WILL PRINT OUT INSTRUCTIONS FOR PT.

## 2011-06-20 NOTE — Discharge Instructions (Signed)
Drink plenty of fluids, closely monitor blood pressures and blood sugars. Cautious with changing position due slowly. Followup with Dr. Jacky Kindle as an outpatient

## 2011-06-21 NOTE — ED Provider Notes (Signed)
Medical screening examination/treatment/procedure(s) were conducted as a shared visit with non-physician practitioner(s) and myself.  I personally evaluated the patient during the encounter.  This elderly female presents with concerns over an increasing frequency of falls.  Notably, the patient had a fall just prior to arrival with resultant head and neck pain.  On my exam the patient is in no distress, continues to complain of the aforementioned pain.  She had no focal neurologic deficits.  Notably, the patient was symptomatically orthostatic.  Interestingly, the patient's ECG had new T-wave inversions inferiorly and laterally.  The patient's radiographic studies did not demonstrate new acute fractures. Given the patient's concern over increasing instability, the EKG changes, we discussed the case with the patient's primary care physician, Dr. Lorain Childes, who evaluated the patient for admission.  I have seen the radioactive studies and agree with the interpretation Have also seen the ECG in the interpretation; new T-wave inversions, abnormal    Gerhard Munch, MD 06/21/11 0745

## 2011-06-29 ENCOUNTER — Other Ambulatory Visit: Payer: Self-pay | Admitting: Cardiology

## 2011-09-08 ENCOUNTER — Other Ambulatory Visit: Payer: Self-pay | Admitting: Cardiology

## 2012-09-23 DIAGNOSIS — Z8673 Personal history of transient ischemic attack (TIA), and cerebral infarction without residual deficits: Secondary | ICD-10-CM | POA: Insufficient documentation

## 2012-09-23 DIAGNOSIS — Z8719 Personal history of other diseases of the digestive system: Secondary | ICD-10-CM | POA: Insufficient documentation

## 2012-09-23 DIAGNOSIS — I10 Essential (primary) hypertension: Secondary | ICD-10-CM | POA: Insufficient documentation

## 2012-09-23 DIAGNOSIS — I252 Old myocardial infarction: Secondary | ICD-10-CM | POA: Insufficient documentation

## 2012-09-23 DIAGNOSIS — E119 Type 2 diabetes mellitus without complications: Secondary | ICD-10-CM | POA: Insufficient documentation

## 2012-09-23 DIAGNOSIS — E785 Hyperlipidemia, unspecified: Secondary | ICD-10-CM

## 2012-09-23 HISTORY — DX: Old myocardial infarction: I25.2

## 2012-09-23 HISTORY — DX: Hyperlipidemia, unspecified: E78.5

## 2012-10-08 DIAGNOSIS — C44111 Basal cell carcinoma of skin of unspecified eyelid, including canthus: Secondary | ICD-10-CM

## 2012-10-08 HISTORY — DX: Basal cell carcinoma of skin of unspecified eyelid, including canthus: C44.111

## 2012-11-25 ENCOUNTER — Emergency Department (HOSPITAL_COMMUNITY): Payer: Medicare Other

## 2012-11-25 ENCOUNTER — Emergency Department (HOSPITAL_COMMUNITY)
Admission: EM | Admit: 2012-11-25 | Discharge: 2012-11-25 | Disposition: A | Discharge status: Home / Self Care | Payer: Medicare Other | Attending: Emergency Medicine | Admitting: Emergency Medicine

## 2012-11-25 ENCOUNTER — Encounter (HOSPITAL_COMMUNITY): Payer: Self-pay

## 2012-11-25 DIAGNOSIS — R296 Repeated falls: Secondary | ICD-10-CM | POA: Insufficient documentation

## 2012-11-25 DIAGNOSIS — E785 Hyperlipidemia, unspecified: Secondary | ICD-10-CM | POA: Insufficient documentation

## 2012-11-25 DIAGNOSIS — I2510 Atherosclerotic heart disease of native coronary artery without angina pectoris: Secondary | ICD-10-CM | POA: Insufficient documentation

## 2012-11-25 DIAGNOSIS — R42 Dizziness and giddiness: Secondary | ICD-10-CM | POA: Insufficient documentation

## 2012-11-25 DIAGNOSIS — Z8673 Personal history of transient ischemic attack (TIA), and cerebral infarction without residual deficits: Secondary | ICD-10-CM | POA: Insufficient documentation

## 2012-11-25 DIAGNOSIS — I10 Essential (primary) hypertension: Secondary | ICD-10-CM | POA: Insufficient documentation

## 2012-11-25 DIAGNOSIS — Z7982 Long term (current) use of aspirin: Secondary | ICD-10-CM | POA: Insufficient documentation

## 2012-11-25 DIAGNOSIS — M129 Arthropathy, unspecified: Secondary | ICD-10-CM | POA: Insufficient documentation

## 2012-11-25 DIAGNOSIS — Y9289 Other specified places as the place of occurrence of the external cause: Secondary | ICD-10-CM | POA: Insufficient documentation

## 2012-11-25 DIAGNOSIS — Z794 Long term (current) use of insulin: Secondary | ICD-10-CM | POA: Insufficient documentation

## 2012-11-25 DIAGNOSIS — M109 Gout, unspecified: Secondary | ICD-10-CM | POA: Insufficient documentation

## 2012-11-25 DIAGNOSIS — E119 Type 2 diabetes mellitus without complications: Secondary | ICD-10-CM | POA: Insufficient documentation

## 2012-11-25 DIAGNOSIS — Z8719 Personal history of other diseases of the digestive system: Secondary | ICD-10-CM | POA: Insufficient documentation

## 2012-11-25 DIAGNOSIS — N390 Urinary tract infection, site not specified: Secondary | ICD-10-CM | POA: Insufficient documentation

## 2012-11-25 DIAGNOSIS — S5291XA Unspecified fracture of right forearm, initial encounter for closed fracture: Secondary | ICD-10-CM

## 2012-11-25 DIAGNOSIS — Z79899 Other long term (current) drug therapy: Secondary | ICD-10-CM | POA: Insufficient documentation

## 2012-11-25 DIAGNOSIS — E875 Hyperkalemia: Secondary | ICD-10-CM | POA: Insufficient documentation

## 2012-11-25 DIAGNOSIS — S52599A Other fractures of lower end of unspecified radius, initial encounter for closed fracture: Secondary | ICD-10-CM | POA: Insufficient documentation

## 2012-11-25 DIAGNOSIS — R269 Unspecified abnormalities of gait and mobility: Secondary | ICD-10-CM | POA: Insufficient documentation

## 2012-11-25 DIAGNOSIS — Y9301 Activity, walking, marching and hiking: Secondary | ICD-10-CM | POA: Insufficient documentation

## 2012-11-25 LAB — BASIC METABOLIC PANEL
CO2: 25 mEq/L (ref 19–32)
Chloride: 103 mEq/L (ref 96–112)
Creatinine, Ser: 1.04 mg/dL (ref 0.50–1.10)
GFR calc Af Amer: 58 mL/min — ABNORMAL LOW (ref >90)
Potassium: 5.5 mEq/L — ABNORMAL HIGH (ref 3.5–5.1)

## 2012-11-25 LAB — URINALYSIS, ROUTINE W REFLEX MICROSCOPIC
Bilirubin Urine: NEGATIVE
Nitrite: POSITIVE — AB
Specific Gravity, Urine: 1.021 (ref 1.005–1.030)
pH: 5 (ref 5.0–8.0)

## 2012-11-25 LAB — POCT I-STAT, CHEM 8
Chloride: 105 mEq/L (ref 96–112)
Creatinine, Ser: 1.1 mg/dL (ref 0.50–1.10)
Glucose, Bld: 160 mg/dL — ABNORMAL HIGH (ref 70–99)
HCT: 35 % — ABNORMAL LOW (ref 36.0–46.0)
Hemoglobin: 11.9 g/dL — ABNORMAL LOW (ref 12.0–15.0)
Potassium: 5.7 mEq/L — ABNORMAL HIGH (ref 3.5–5.1)
Sodium: 138 mEq/L (ref 135–145)

## 2012-11-25 LAB — URINE MICROSCOPIC-ADD ON

## 2012-11-25 MED ORDER — SULFAMETHOXAZOLE-TRIMETHOPRIM 800-160 MG PO TABS: 1.0000 | ORAL_TABLET | Freq: Two times a day (BID) | ORAL | Status: DC

## 2012-11-25 MED ORDER — METHOCARBAMOL 750 MG PO TABS: 750.0000 mg | ORAL_TABLET | Freq: Three times a day (TID) | ORAL | Status: DC

## 2012-11-25 MED ORDER — ONDANSETRON HCL 4 MG/2ML IJ SOLN
4.0000 mg | Freq: Once | INTRAMUSCULAR | Status: AC
  Administered 2012-11-25: 4 mg via INTRAVENOUS
  Filled 2012-11-25: qty 2

## 2012-11-25 MED ORDER — OXYCODONE-ACETAMINOPHEN 5-325 MG PO TABS
1.0000 | ORAL_TABLET | Freq: Once | ORAL | Status: AC
  Administered 2012-11-25: 1 via ORAL
  Filled 2012-11-25: qty 1

## 2012-11-25 MED ORDER — MORPHINE SULFATE 4 MG/ML IJ SOLN
4.0000 mg | Freq: Once | INTRAMUSCULAR | Status: AC
Start: 2012-11-25 — End: 2012-11-25
  Administered 2012-11-25: 4 mg via INTRAVENOUS
  Filled 2012-11-25: qty 1

## 2012-11-25 MED ORDER — HYDROCODONE-ACETAMINOPHEN 5-325 MG PO TABS: 2.0000 | ORAL_TABLET | ORAL | Status: DC | PRN

## 2012-11-25 MED ORDER — SODIUM POLYSTYRENE SULFONATE 15 GM/60ML PO SUSP
30.0000 g | Freq: Once | ORAL | Status: AC
  Administered 2012-11-25: 30 g via ORAL
  Filled 2012-11-25: qty 120

## 2012-11-25 NOTE — ED Provider Notes (Signed)
[name] is a 79 y.o. female here for evaluation after fall inside home. She feels it was related to her poor balance. She c/o right wrist injury only. No recent illness.  Exam- Alert, elderly female in mild distress. Right arm in splint- fingers, have normal range of motion and sensation. Neurologic exam is grossly nonfocal. She is alert and oriented x3.   Assessment: Likely mechanical fall with isolated injury to the right wrist. Doubt metabolic instability, urinary tract infection, impending vascular collapse. She is stable for discharge  Plan: Fracture care as per hand surgeon. Potassium level rechecked.   Medical screening examination/treatment/procedure(s) were conducted as a shared visit with non-physician practitioner(s) and myself.  I personally evaluated the patient during the encounter  Flint Melter, MD 11/26/12 [phone]

## 2012-11-25 NOTE — ED Provider Notes (Addendum)
CSN: [id number]     Arrival date & time 11/25/12  1551 History   First MD Initiated Contact with Patient 11/25/12 1748     Chief Complaint  Patient presents with  . Fall  . Wrist Injury   (Consider location/radiation/quality/duration/timing/severity/associated sxs/prior Treatment) Patient is a 79 y.o. female presenting with fall and wrist injury. The history is provided by the patient. No language interpreter was used.  Fall This is a recurrent problem. The current episode started today.  Wrist Injury Location:  Hand Time since incident:  6 hours Injury: yes   Mechanism of injury: fall   Fall:    Fall occurred:  Walking   Impact surface:  Hard floor   Point of impact:  Unable to specify Hand location:  R hand Pain details:    Quality:  Aching   Radiates to:  Does not radiate   Onset quality:  Sudden   Timing:  Constant Chronicity:  New Handedness:  Right-handed Foreign body present:  No foreign bodies Relieved by:  Rest Worsened by:  Movement Risk factors: known bone disorder     Past Medical History  Diagnosis Date  . Diabetes mellitus   . Hypertension   . Coronary artery disease   . Gout   . GERD (gastroesophageal reflux disease)   . Stroke   . Arthritis   . Diverticulosis   . Gout   . Hyperlipemia   . IBS (irritable bowel syndrome)    Past Surgical History  Procedure Laterality Date  . Back surgery    . Appendectomy    . Abdominal hysterectomy    . Cataract surgery     No family history on file. History  Substance Use Topics  . Smoking status: Never Smoker   . Smokeless tobacco: Never Used  . Alcohol Use: No     Comment: Unable to ascertain due to altered mental status.   OB History   Grav Para Term Preterm Abortions TAB SAB Ect Mult Living                 Review of Systems  Neurological: Positive for light-headedness.  All other systems reviewed and are negative.    Allergies  Cephalexin and Ivp dye  Home Medications   Current  Outpatient Rx  Name  Route  Sig  Dispense  Refill  . aspirin EC 81 MG tablet   Oral   Take 81 mg by mouth daily.         Marland Kitchen HYDROcodone-acetaminophen (NORCO) 10-325 MG per tablet   Oral   Take 1 tablet by mouth every 4 (four) hours as needed. For pain.         Marland Kitchen insulin aspart (NOVOLOG) 100 UNIT/ML injection   Subcutaneous   Inject into the skin 3 (three) times daily before meals. Sliding scale         . insulin detemir (LEVEMIR) 100 UNIT/ML injection   Subcutaneous   Inject 6 Units into the skin 2 (two) times daily.         . metoprolol tartrate (LOPRESSOR) 25 MG tablet   Oral   Take 25 mg by mouth daily.         . simvastatin (ZOCOR) 40 MG tablet      TAKE ONE TABLET BY MOUTH DAILY AT BEDTIME   30 tablet   4   . temazepam (RESTORIL) 15 MG capsule   Oral   Take 15 mg by mouth at bedtime as needed. For sleep.  BP 128/66  Pulse 63  Temp(Src) 99.7 F (37.6 C) (Oral)  Resp 20  SpO2 96% Physical Exam  Nursing note and vitals reviewed. Constitutional: She is oriented to person, place, and time. She appears well-developed.  HENT:  Head: Normocephalic and atraumatic.  Eyes: EOM are normal. Pupils are equal, round, and reactive to light.  Cardiovascular: Normal rate and regular rhythm.   Pulmonary/Chest: Effort normal and breath sounds normal.  Abdominal: Soft. Bowel sounds are normal.  Musculoskeletal: She exhibits edema and tenderness.       Right wrist: She exhibits decreased range of motion, tenderness, swelling and deformity.  Neurological: She is alert and oriented to person, place, and time.  Skin: Skin is warm and dry.  Psychiatric: She has a normal mood and affect. Her behavior is normal. Judgment and thought content normal.    ED Course  Procedures (including critical care time) Labs Review Labs Reviewed  POCT I-STAT, CHEM 8 - Abnormal; Notable for the following:    Potassium 5.7 (*)    BUN 34 (*)    Glucose, Bld 160 (*)     Hemoglobin 11.9 (*)    HCT 35.0 (*)    All other components within normal limits  URINALYSIS, ROUTINE W REFLEX MICROSCOPIC   Imaging Review Dg Wrist Complete Right  11/25/2012   *RADIOLOGY REPORT*  Clinical Data: Fall, wrist injury  RIGHT WRIST - COMPLETE 3+ VIEW  Comparison: None.  Findings: Four views of the right wrist submitted.  There is diffuse osteopenia.  There is displaced angulated fracture in distal right radial metaphysis.  Subtle cortical step-off in distal ulna suspicious for nondisplaced fracture.  IMPRESSION:  There is diffuse osteopenia.  There is displaced angulated fracture in distal right radial metaphysis.  Subtle cortical step- off in distal ulna suspicious for nondisplaced fracture.  Per CMS PQRS reporting requirements (PQRS Measure 24): Given the patient's age of greater than 50 and the fracture site (hip, distal radius, or spine), the patient should be tested for osteoporosis using DXA, and the appropriate treatment considered based on the DXA results.   Original Report Authenticated By: Natasha Mead, M.D.   Patient with mechanical fall at home, resulting in injury to right wrist.  Patient has persistent gait/balance disturbance secondary to previous CVA.    Consult with hand (Gramig)--he will be in to see and reduce fracture.  Repeat potassium of 5.5.  UA indicates UTI.  Will treat high potassium with 30 grams of kayexalate and have patient follow-up with PCP tomorrow for recheck of labs.  Bactrim for UTI. MDM  No diagnosis found. Right radial fracture. UTI. Mild hyperkalemia.    Jimmye Norman, NP 11/25/12 2356  Jimmye Norman, NP 12/05/12 [phone]

## 2012-11-25 NOTE — ED Notes (Signed)
Pt states she lost her balance and fell, obvious deformity to rt wrist, pt states she has be lightheaded and unsteady gait today for unknown reason. Pt denies LOC

## 2012-11-25 NOTE — Consult Note (Signed)
Reason for Consult: Fracture right distal radius Referring Physician: Emergency room staff  [name] is an 79 y.o. female.  HPI: Patient is status post fall with significantly displaced right distal radius fracture. She is here with her family (daughter). She notes no prior injury. She has a history of frequent falls. She denies she sees Dr. Jacky Kindle for her regular medical needs. She notes that she lives with her grandson. She states that she does not drive a car. 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 She notes that she is sometimes able to cook her meals. She is a delightful female.   She notes no neck back chest or abdominal pain.  She is alert and oriented and appropriate. She does have history multiple medical problems.  Past Medical History  Diagnosis Date  . Diabetes mellitus   . Hypertension   . Coronary artery disease   . Gout   . GERD (gastroesophageal reflux disease)   . Stroke   . Arthritis   . Diverticulosis   . Gout   . Hyperlipemia   . IBS (irritable bowel syndrome)     Past Surgical History  Procedure Laterality Date  . Back surgery    . Appendectomy    . Abdominal hysterectomy    . Cataract surgery      No family history on file.  Social History:  reports that she has never smoked. She has never used smokeless tobacco. She reports that she does not drink alcohol or use illicit drugs.  Allergies:  Allergies  Allergen Reactions  . Cephalexin Hives  . Ivp Dye [Iodinated Diagnostic Agents] Hives    Medications: I have reviewed the patient's current medications.  Results for orders placed during the hospital encounter of 11/25/12 (from the past 48 hour(s))  POCT I-STAT, CHEM 8     Status: Abnormal   Collection Time    11/25/12  7:28 PM      Result Value Range   Sodium 138  135 - 145 mEq/L   Potassium 5.7 (*) 3.5 - 5.1 mEq/L   Chloride 105  96 - 112 mEq/L   BUN 34 (*) 6 - 23 mg/dL   Creatinine, Ser 9.14  0.50 - 1.10 mg/dL   Glucose,  Bld 782 (*) 70 - 99 mg/dL   Calcium, Ion 9.56  2.13 - 1.30 mmol/L   TCO2 26  0 - 100 mmol/L   Hemoglobin 11.9 (*) 12.0 - 15.0 g/dL   HCT 08.6 (*) 57.8 - 46.9 %    Dg Wrist Complete Right  11/25/2012   *RADIOLOGY REPORT*  Clinical Data: Fall, wrist injury  RIGHT WRIST - COMPLETE 3+ VIEW  Comparison: None.  Findings: Four views of the right wrist submitted.  There is diffuse osteopenia.  There is displaced angulated fracture in distal right radial metaphysis.  Subtle cortical step-off in distal ulna suspicious for nondisplaced fracture.  IMPRESSION:  There is diffuse osteopenia.  There is displaced angulated fracture in distal right radial metaphysis.  Subtle cortical step- off in distal ulna suspicious for nondisplaced fracture.  Per CMS PQRS reporting requirements (PQRS Measure 24): Given the patient's age of greater than 50 and the fracture site (hip, distal radius, or spine), the patient should be tested for osteoporosis using DXA, and the appropriate treatment considered based on the DXA results.   Original Report Authenticated By: Natasha Mead, M.D.    Review of Systems  Constitutional: Negative.   HENT: Negative.   Eyes: Negative.  Cardiovascular: Negative.   Gastrointestinal: Negative.   Genitourinary: Negative.   Skin: Negative.   Endo/Heme/Allergies: Negative.    Blood pressure 128/66, pulse 63, temperature 99.7 F (37.6 C), temperature source Oral, resp. rate 20, SpO2 96.00%. Physical Exam  pleasant female alert and oriented. She has an obvious distal radius fracture which is displaced about the right upper extremity. She has intact sensation and refill of the fingers. Elbow shoulder and humerus are nontender. Left upper extremity is neurovascularly intact. Marland Kitchen.The patient is alert and oriented in no acute distress the patient complains of pain in the affected upper extremity.  The patient is noted to have a normal HEENT exam.  Lung fields show equal chest expansion and no shortness  of breath  abdomen exam is nontender without distention.  Lower extremity examination does not show any fracture dislocation or blood clot symptoms.  Pelvis is stable neck and back are stable and nontender    Assessment/Plan: She is consented for closed reduction of her radius.  She and her daughter understand all issues risk and benefits. I discussed her that I would recommend provisional reduction followed by discussion with her regular physician in terms of definitive fixation versus accepting some risk of progressive angulatory collapse.   ..11/25/2012  8:25 PM  PATIENT:  [name]    PRE-OPERATIVE DIAGNOSIS:   right distal radius fracture comminuted complex   POST-OPERATIVE DIAGNOSIS:  Same  PROCEDURE:   Closed reduction right distal radius fracture with hematoma block SURGEON:  Karen Chafe, MD  PHYSICIAN ASSISTANT:   none ANESTHESIA:    hematoma block  PREOPERATIVE INDICATIONS:  [name] is a  79 y.o. female with a diagnosis of   The risks benefits and alternatives were discussed with the patient preoperatively including but not limited to the risks of infection, bleeding, nerve injury, cardiopulmonary complications, the need for revision surgery, among others, and the patient was willing to proceed.   OPERATIVE PROCEDURE:  The risks benefits and alternatives were discussed with the patient preoperatively including but not limited to the risks of infection, bleeding, nerve injury, cardiopulmonary complications, the need for revision surgery, among others, and the patient was willing to proceed.   OPERATIVE PROCEDURE: The patient was seen and counseled in regard to the upper extremity predicament. Following this the extremity underwent a hematoma block with lidocaine without epinephrine. I sterilely prepped and draped a skin prior to doing so. Once this was accomplished the patient was placed in fingertrap traction and underwent a reduction of the  distal radius. The fracture was reduced and following this a sugar tong splint/cast was applied without difficulty. The neurovascular status showed no evidence of compartment syndrome, dystrophy or infection. the patient had pink fingertips, excellent refill and no complications.  We have discussed with patient elevation,  range of motion to the fingers, massage and other measures to prevent neurovascular problems.  Once again we plan to proceed with ice elevation move and massage fingers. Postreduction x-ray showed improved position of the fracture. Will plan for serial radiographs and fixation based on the amount of comminution and progressive angulatory collapse as well as wrist Apgar scores. Patient understands these don'ts etc.  I discussed with the patient all issues. Will plan for vitamin C Peri-Colace and Norco when necessary pain. She usually takes Norco once every 8 hours but we'll increase this to 2 tablets every 4 hours I discussed this with she and her family. Also placed on a spasm medicine as needed.  Karen Chafe 11/25/2012, 8:22 PM

## 2012-11-27 LAB — URINE CULTURE

## 2012-11-28 NOTE — ED Notes (Signed)
+   Urine Patient treated with Sulfa-trim sensitive to same-chart appended per protocol MD. 

## 2012-12-02 ENCOUNTER — Ambulatory Visit: Admit: 2012-12-02 | Payer: Self-pay | Admitting: Orthopedic Surgery

## 2012-12-02 SURGERY — OPEN REDUCTION INTERNAL FIXATION (ORIF) WRIST FRACTURE
Anesthesia: General | Site: Wrist | Laterality: Right

## 2012-12-06 NOTE — ED Provider Notes (Signed)
Medical screening examination/treatment/procedure(s) were conducted as a shared visit with non-physician practitioner(s) and myself.  I personally evaluated the patient during the encounter  Flint Melter, MD 12/06/12 1401

## 2015-05-16 DIAGNOSIS — E1151 Type 2 diabetes mellitus with diabetic peripheral angiopathy without gangrene: Secondary | ICD-10-CM | POA: Diagnosis not present

## 2015-05-16 DIAGNOSIS — N390 Urinary tract infection, site not specified: Secondary | ICD-10-CM | POA: Diagnosis not present

## 2015-05-16 DIAGNOSIS — I10 Essential (primary) hypertension: Secondary | ICD-10-CM | POA: Diagnosis not present

## 2015-05-16 DIAGNOSIS — R8290 Unspecified abnormal findings in urine: Secondary | ICD-10-CM | POA: Diagnosis not present

## 2015-05-18 DIAGNOSIS — H3522 Other non-diabetic proliferative retinopathy, left eye: Secondary | ICD-10-CM | POA: Diagnosis not present

## 2015-05-27 DIAGNOSIS — I2510 Atherosclerotic heart disease of native coronary artery without angina pectoris: Secondary | ICD-10-CM | POA: Diagnosis not present

## 2015-05-27 DIAGNOSIS — I10 Essential (primary) hypertension: Secondary | ICD-10-CM | POA: Diagnosis not present

## 2015-05-27 DIAGNOSIS — E1151 Type 2 diabetes mellitus with diabetic peripheral angiopathy without gangrene: Secondary | ICD-10-CM | POA: Diagnosis not present

## 2015-05-27 DIAGNOSIS — Z6827 Body mass index (BMI) 27.0-27.9, adult: Secondary | ICD-10-CM | POA: Diagnosis not present

## 2015-05-27 DIAGNOSIS — Z1389 Encounter for screening for other disorder: Secondary | ICD-10-CM | POA: Diagnosis not present

## 2015-05-27 DIAGNOSIS — Z0000 Encounter for general adult medical examination without abnormal findings: Secondary | ICD-10-CM | POA: Diagnosis not present

## 2015-06-20 DIAGNOSIS — E1151 Type 2 diabetes mellitus with diabetic peripheral angiopathy without gangrene: Secondary | ICD-10-CM | POA: Diagnosis not present

## 2015-07-19 DIAGNOSIS — H3522 Other non-diabetic proliferative retinopathy, left eye: Secondary | ICD-10-CM | POA: Diagnosis not present

## 2015-08-03 DIAGNOSIS — E1151 Type 2 diabetes mellitus with diabetic peripheral angiopathy without gangrene: Secondary | ICD-10-CM | POA: Diagnosis not present

## 2015-09-22 DIAGNOSIS — E1151 Type 2 diabetes mellitus with diabetic peripheral angiopathy without gangrene: Secondary | ICD-10-CM | POA: Diagnosis not present

## 2015-09-27 DIAGNOSIS — E113593 Type 2 diabetes mellitus with proliferative diabetic retinopathy without macular edema, bilateral: Secondary | ICD-10-CM | POA: Diagnosis not present

## 2015-09-27 DIAGNOSIS — H3522 Other non-diabetic proliferative retinopathy, left eye: Secondary | ICD-10-CM | POA: Diagnosis not present

## 2015-11-05 DIAGNOSIS — E1151 Type 2 diabetes mellitus with diabetic peripheral angiopathy without gangrene: Secondary | ICD-10-CM | POA: Diagnosis not present

## 2015-12-27 DIAGNOSIS — H3522 Other non-diabetic proliferative retinopathy, left eye: Secondary | ICD-10-CM | POA: Diagnosis not present

## 2016-01-02 DIAGNOSIS — E1151 Type 2 diabetes mellitus with diabetic peripheral angiopathy without gangrene: Secondary | ICD-10-CM | POA: Diagnosis not present

## 2016-02-21 DIAGNOSIS — E1151 Type 2 diabetes mellitus with diabetic peripheral angiopathy without gangrene: Secondary | ICD-10-CM | POA: Diagnosis not present

## 2016-04-12 DIAGNOSIS — E1151 Type 2 diabetes mellitus with diabetic peripheral angiopathy without gangrene: Secondary | ICD-10-CM | POA: Diagnosis not present

## 2016-04-24 DIAGNOSIS — H3522 Other non-diabetic proliferative retinopathy, left eye: Secondary | ICD-10-CM | POA: Diagnosis not present

## 2016-06-12 DIAGNOSIS — E1151 Type 2 diabetes mellitus with diabetic peripheral angiopathy without gangrene: Secondary | ICD-10-CM | POA: Diagnosis not present

## 2016-06-28 DIAGNOSIS — E1151 Type 2 diabetes mellitus with diabetic peripheral angiopathy without gangrene: Secondary | ICD-10-CM | POA: Diagnosis not present

## 2016-06-28 DIAGNOSIS — I10 Essential (primary) hypertension: Secondary | ICD-10-CM | POA: Diagnosis not present

## 2016-07-16 DIAGNOSIS — M5489 Other dorsalgia: Secondary | ICD-10-CM | POA: Diagnosis not present

## 2016-07-16 DIAGNOSIS — Z0000 Encounter for general adult medical examination without abnormal findings: Secondary | ICD-10-CM | POA: Diagnosis not present

## 2016-07-16 DIAGNOSIS — E1151 Type 2 diabetes mellitus with diabetic peripheral angiopathy without gangrene: Secondary | ICD-10-CM | POA: Diagnosis not present

## 2016-07-16 DIAGNOSIS — N390 Urinary tract infection, site not specified: Secondary | ICD-10-CM | POA: Diagnosis not present

## 2016-07-16 DIAGNOSIS — R8290 Unspecified abnormal findings in urine: Secondary | ICD-10-CM | POA: Diagnosis not present

## 2016-07-16 DIAGNOSIS — I10 Essential (primary) hypertension: Secondary | ICD-10-CM | POA: Diagnosis not present

## 2016-07-22 DIAGNOSIS — E1151 Type 2 diabetes mellitus with diabetic peripheral angiopathy without gangrene: Secondary | ICD-10-CM | POA: Diagnosis not present

## 2016-08-29 DIAGNOSIS — E1151 Type 2 diabetes mellitus with diabetic peripheral angiopathy without gangrene: Secondary | ICD-10-CM | POA: Diagnosis not present

## 2016-09-25 DIAGNOSIS — H3522 Other non-diabetic proliferative retinopathy, left eye: Secondary | ICD-10-CM | POA: Diagnosis not present

## 2016-09-25 DIAGNOSIS — H3412 Central retinal artery occlusion, left eye: Secondary | ICD-10-CM | POA: Diagnosis not present

## 2016-09-25 DIAGNOSIS — H4720 Unspecified optic atrophy: Secondary | ICD-10-CM | POA: Diagnosis not present

## 2016-09-25 DIAGNOSIS — H43811 Vitreous degeneration, right eye: Secondary | ICD-10-CM | POA: Diagnosis not present

## 2016-10-04 DIAGNOSIS — E1151 Type 2 diabetes mellitus with diabetic peripheral angiopathy without gangrene: Secondary | ICD-10-CM | POA: Diagnosis not present

## 2016-10-10 DIAGNOSIS — E1151 Type 2 diabetes mellitus with diabetic peripheral angiopathy without gangrene: Secondary | ICD-10-CM | POA: Diagnosis not present

## 2016-10-10 DIAGNOSIS — M4800 Spinal stenosis, site unspecified: Secondary | ICD-10-CM | POA: Diagnosis not present

## 2016-10-10 DIAGNOSIS — I10 Essential (primary) hypertension: Secondary | ICD-10-CM | POA: Diagnosis not present

## 2016-10-10 DIAGNOSIS — Z79899 Other long term (current) drug therapy: Secondary | ICD-10-CM | POA: Diagnosis not present

## 2016-11-14 DIAGNOSIS — E1151 Type 2 diabetes mellitus with diabetic peripheral angiopathy without gangrene: Secondary | ICD-10-CM | POA: Diagnosis not present

## 2016-12-26 DIAGNOSIS — E1151 Type 2 diabetes mellitus with diabetic peripheral angiopathy without gangrene: Secondary | ICD-10-CM | POA: Diagnosis not present

## 2017-01-17 DIAGNOSIS — I10 Essential (primary) hypertension: Secondary | ICD-10-CM | POA: Diagnosis not present

## 2017-01-17 DIAGNOSIS — E1151 Type 2 diabetes mellitus with diabetic peripheral angiopathy without gangrene: Secondary | ICD-10-CM | POA: Diagnosis not present

## 2017-01-17 DIAGNOSIS — M1990 Unspecified osteoarthritis, unspecified site: Secondary | ICD-10-CM | POA: Diagnosis not present

## 2017-01-17 DIAGNOSIS — Z79899 Other long term (current) drug therapy: Secondary | ICD-10-CM | POA: Diagnosis not present

## 2017-02-08 DIAGNOSIS — E1151 Type 2 diabetes mellitus with diabetic peripheral angiopathy without gangrene: Secondary | ICD-10-CM | POA: Diagnosis not present

## 2017-03-23 DIAGNOSIS — E1151 Type 2 diabetes mellitus with diabetic peripheral angiopathy without gangrene: Secondary | ICD-10-CM | POA: Diagnosis not present

## 2017-03-29 DIAGNOSIS — H43811 Vitreous degeneration, right eye: Secondary | ICD-10-CM | POA: Diagnosis not present

## 2017-03-29 DIAGNOSIS — H3522 Other non-diabetic proliferative retinopathy, left eye: Secondary | ICD-10-CM | POA: Diagnosis not present

## 2017-03-29 DIAGNOSIS — H3412 Central retinal artery occlusion, left eye: Secondary | ICD-10-CM | POA: Diagnosis not present

## 2017-03-29 DIAGNOSIS — H4720 Unspecified optic atrophy: Secondary | ICD-10-CM | POA: Diagnosis not present

## 2017-04-16 DIAGNOSIS — N6321 Unspecified lump in the left breast, upper outer quadrant: Secondary | ICD-10-CM | POA: Diagnosis not present

## 2017-04-16 DIAGNOSIS — Z79899 Other long term (current) drug therapy: Secondary | ICD-10-CM | POA: Diagnosis not present

## 2017-04-17 ENCOUNTER — Other Ambulatory Visit: Payer: Self-pay | Admitting: Family Medicine

## 2017-04-17 DIAGNOSIS — N6320 Unspecified lump in the left breast, unspecified quadrant: Secondary | ICD-10-CM

## 2017-05-01 ENCOUNTER — Other Ambulatory Visit: Payer: Medicare Other

## 2017-05-13 ENCOUNTER — Ambulatory Visit
Admission: RE | Admit: 2017-05-13 | Discharge: 2017-05-13 | Disposition: A | Discharge status: Home / Self Care | Payer: Medicare Other | Source: Ambulatory Visit | Attending: Family Medicine | Admitting: Family Medicine

## 2017-05-13 ENCOUNTER — Other Ambulatory Visit: Payer: Self-pay | Admitting: Family Medicine

## 2017-05-13 DIAGNOSIS — R922 Inconclusive mammogram: Secondary | ICD-10-CM | POA: Diagnosis not present

## 2017-05-13 DIAGNOSIS — N6320 Unspecified lump in the left breast, unspecified quadrant: Secondary | ICD-10-CM

## 2017-05-13 DIAGNOSIS — N6489 Other specified disorders of breast: Secondary | ICD-10-CM | POA: Diagnosis not present

## 2017-05-16 ENCOUNTER — Ambulatory Visit
Admission: RE | Admit: 2017-05-16 | Discharge: 2017-05-16 | Disposition: A | Discharge status: Home / Self Care | Payer: Medicare Other | Source: Ambulatory Visit | Attending: Family Medicine | Admitting: Family Medicine

## 2017-05-16 ENCOUNTER — Other Ambulatory Visit: Payer: Self-pay | Admitting: Family Medicine

## 2017-05-16 DIAGNOSIS — N6320 Unspecified lump in the left breast, unspecified quadrant: Secondary | ICD-10-CM

## 2017-05-16 DIAGNOSIS — N6321 Unspecified lump in the left breast, upper outer quadrant: Secondary | ICD-10-CM | POA: Diagnosis not present

## 2017-05-16 DIAGNOSIS — C50412 Malignant neoplasm of upper-outer quadrant of left female breast: Secondary | ICD-10-CM | POA: Diagnosis not present

## 2017-05-16 DIAGNOSIS — N6322 Unspecified lump in the left breast, upper inner quadrant: Secondary | ICD-10-CM | POA: Diagnosis not present

## 2017-05-21 ENCOUNTER — Telehealth: Payer: Self-pay | Admitting: Hematology and Oncology

## 2017-05-21 NOTE — Telephone Encounter (Signed)
Appt has been scheduled with the pt's daughter to see Dr. Lindi Adie on 2/25 at 215pm. [name] is aware to have her arrive 30 minutes early to be checked in on time.

## 2017-05-22 ENCOUNTER — Encounter: Payer: Self-pay | Admitting: Adult Health

## 2017-05-22 DIAGNOSIS — Z170 Estrogen receptor positive status [ER+]: Secondary | ICD-10-CM

## 2017-05-22 DIAGNOSIS — C50412 Malignant neoplasm of upper-outer quadrant of left female breast: Secondary | ICD-10-CM

## 2017-05-22 HISTORY — DX: Estrogen receptor positive status (ER+): C50.412

## 2017-05-27 ENCOUNTER — Telehealth: Payer: Self-pay | Admitting: *Deleted

## 2017-05-27 ENCOUNTER — Ambulatory Visit: Payer: Medicare Other | Admitting: Hematology and Oncology

## 2017-05-27 NOTE — Telephone Encounter (Signed)
Received notification from [facility] pt unable to attend appt with Dr. Lindi Adie d/t "stomache bug". Msg sent to Seth Bake to r/s.

## 2017-06-11 ENCOUNTER — Inpatient Hospital Stay: Payer: Medicare Other | Attending: Hematology and Oncology | Admitting: Hematology and Oncology

## 2017-06-11 ENCOUNTER — Encounter: Payer: Self-pay | Admitting: *Deleted

## 2017-06-11 DIAGNOSIS — Z7982 Long term (current) use of aspirin: Secondary | ICD-10-CM | POA: Diagnosis not present

## 2017-06-11 DIAGNOSIS — M129 Arthropathy, unspecified: Secondary | ICD-10-CM | POA: Insufficient documentation

## 2017-06-11 DIAGNOSIS — Z170 Estrogen receptor positive status [ER+]: Secondary | ICD-10-CM

## 2017-06-11 DIAGNOSIS — K219 Gastro-esophageal reflux disease without esophagitis: Secondary | ICD-10-CM

## 2017-06-11 DIAGNOSIS — C50412 Malignant neoplasm of upper-outer quadrant of left female breast: Secondary | ICD-10-CM | POA: Diagnosis not present

## 2017-06-11 DIAGNOSIS — Z8673 Personal history of transient ischemic attack (TIA), and cerebral infarction without residual deficits: Secondary | ICD-10-CM | POA: Diagnosis not present

## 2017-06-11 DIAGNOSIS — E119 Type 2 diabetes mellitus without complications: Secondary | ICD-10-CM | POA: Insufficient documentation

## 2017-06-11 DIAGNOSIS — M109 Gout, unspecified: Secondary | ICD-10-CM | POA: Diagnosis not present

## 2017-06-11 DIAGNOSIS — I2510 Atherosclerotic heart disease of native coronary artery without angina pectoris: Secondary | ICD-10-CM | POA: Insufficient documentation

## 2017-06-11 DIAGNOSIS — K589 Irritable bowel syndrome without diarrhea: Secondary | ICD-10-CM | POA: Diagnosis not present

## 2017-06-11 DIAGNOSIS — I10 Essential (primary) hypertension: Secondary | ICD-10-CM | POA: Insufficient documentation

## 2017-06-11 DIAGNOSIS — Z79899 Other long term (current) drug therapy: Secondary | ICD-10-CM | POA: Diagnosis not present

## 2017-06-11 DIAGNOSIS — Z794 Long term (current) use of insulin: Secondary | ICD-10-CM | POA: Insufficient documentation

## 2017-06-11 DIAGNOSIS — Z803 Family history of malignant neoplasm of breast: Secondary | ICD-10-CM | POA: Diagnosis not present

## 2017-06-11 DIAGNOSIS — E785 Hyperlipidemia, unspecified: Secondary | ICD-10-CM | POA: Diagnosis not present

## 2017-06-11 NOTE — Assessment & Plan Note (Signed)
05/16/2017: Patient palpated a nodule in left breast mass at 2 o'clock position: 3.7 x 3.2 x 2.3 cm, additional circumscribed mass 3.2 cm; left breast biopsy at 11 o'clock position: Benign, left breast mass at 2 o'clock position: IDC with DCIS grade 2, ER 100%, PR 70%, Ki-67 10%, HER-2 negative ratio 1.38, T2 N0 stage Ib  Pathology and radiology counseling: Discussed with the patient, the details of pathology including the type of breast cancer,the clinical staging, the significance of ER, PR and HER-2/neu receptors and the implications for treatment. After reviewing the pathology in detail, we proceeded to discuss the different treatment options between surgery, radiation, chemotherapy, antiestrogen therapies.  Recommendation: 1.  Surgery with lumpectomy versus mastectomy 2. Followed by adjuvant radiation 3.  Followed by antiestrogen therapy -------------------------------------------------------------------------------- return to clinic after surgery to discuss the final pathology report

## 2017-06-11 NOTE — Progress Notes (Signed)
She is in a wheelchair [place] NOTE  Patient Care Team: Burnard Bunting, MD as PCP - General (Internal Medicine)  CHIEF COMPLAINTS/PURPOSE OF CONSULTATION:  Newly diagnosed breast cancer  HISTORY OF PRESENTING ILLNESS:  [name] 84 y.o. female is here because of recent diagnosis of left breast cancer.  Patient palpated a nodule on the left breast at 2 o'clock position.  This was evaluated by mammogram and ultrasound.  This was noted to be 3.7 cm.  Additional circumscribed mass measuring 3.2 cm was also detected.  Biopsy of the mass at the 11 o'clock position was benign but the mass at 2 o'clock position which was the palpable mass came back as invasive ductal carcinoma with DCIS grade 2 that was ER PR positive HER-2 negative with a Ki-67 of 10%.  Patient was referred to [place] for discussion regarding adjuvant treatment options.  Patient appears to have diffuse bone pain throughout her body including the left posterior chest wall.  She takes pain medications every 4 hours.  She also takes an antidepressant since her husband passed away 4 years ago.  I reviewed her records extensively and collaborated the history with the patient.  SUMMARY OF ONCOLOGIC HISTORY:   Malignant neoplasm of upper-outer quadrant of left breast in female, estrogen receptor positive ([place])   05/16/2017 Initial Diagnosis    Patient palpated a nodule in left breast mass at 2 o'clock position: 3.7 x 3.2 x 2.3 cm, additional circumscribed mass 3.2 cm; left breast biopsy at 11 o'clock position: Benign, left breast mass at 2 o'clock position: IDC with DCIS grade 2, ER 100%, PR 70%, Ki-67 10%, HER-2 negative ratio 1.38, T2 N0 stage Ib      MEDICAL HISTORY:  Past Medical History:  Diagnosis Date  . Arthritis   . Coronary artery disease   . Diabetes mellitus   . Diverticulosis   . GERD (gastroesophageal reflux disease)   . Gout   . Gout   . Hyperlipemia   . Hypertension   . IBS (irritable  bowel syndrome)   . Stroke [facility])     SURGICAL HISTORY: Past Surgical History:  Procedure Laterality Date  . ABDOMINAL HYSTERECTOMY    . APPENDECTOMY    . BACK SURGERY    . cataract surgery      SOCIAL HISTORY: Social History   Socioeconomic History  . Marital status: Married    Spouse name: Not on file  . Number of children: Not on file  . Years of education: Not on file  . Highest education level: Not on file  Social Needs  . Financial resource strain: Not on file  . Food insecurity - worry: Not on file  . Food insecurity - inability: Not on file  . Transportation needs - medical: Not on file  . Transportation needs - non-medical: Not on file  Occupational History  . Not on file  Tobacco Use  . Smoking status: Never Smoker  . Smokeless tobacco: Never Used  Substance and Sexual Activity  . Alcohol use: No    Comment: Unable to ascertain due to altered mental status.  . Drug use: No  . Sexual activity: Not on file  Other Topics Concern  . Not on file  Social History Narrative  . Not on file    FAMILY HISTORY: Family History  Problem Relation Age of Onset  . Breast cancer Daughter 90    ALLERGIES:  is allergic to cephalexin and ivp dye [iodinated diagnostic agents].  MEDICATIONS:  Current Outpatient Medications  Medication Sig Dispense Refill  . aspirin EC 81 MG tablet Take 81 mg by mouth daily.    Marland Kitchen HYDROcodone-acetaminophen (NORCO) 10-325 MG per tablet Take 1 tablet by mouth every 4 (four) hours as needed. For pain.    Marland Kitchen HYDROcodone-acetaminophen (NORCO) 5-325 MG per tablet Take 2 tablets by mouth every 4 (four) hours as needed for pain. 60 tablet 0  . insulin aspart (NOVOLOG) 100 UNIT/ML injection Inject into the skin 3 (three) times daily before meals. Sliding scale    . insulin detemir (LEVEMIR) 100 UNIT/ML injection Inject 6 Units into the skin 2 (two) times daily.    . methocarbamol (ROBAXIN-750) 750 MG tablet Take 1 tablet (750 mg total) by mouth 3  (three) times daily. 35 tablet 0  . metoprolol tartrate (LOPRESSOR) 25 MG tablet Take 25 mg by mouth daily.    . simvastatin (ZOCOR) 40 MG tablet TAKE ONE TABLET BY MOUTH DAILY AT BEDTIME 30 tablet 4  . sulfamethoxazole-trimethoprim (SEPTRA DS) 800-160 MG per tablet Take 1 tablet by mouth 2 (two) times daily. 14 tablet 0  . temazepam (RESTORIL) 15 MG capsule Take 15 mg by mouth at bedtime as needed. For sleep.     No current facility-administered medications for this visit.     REVIEW OF SYSTEMS:   Constitutional: Denies fevers, chills or abnormal night sweats Eyes: Denies blurriness of vision, double vision or watery eyes Ears, nose, mouth, throat, and face: Denies mucositis or sore throat Respiratory: Denies cough, dyspnea or wheezes Cardiovascular: Denies palpitation, chest discomfort or lower extremity swelling Gastrointestinal:  Denies nausea, heartburn or change in bowel habits Skin: Denies abnormal skin rashes Lymphatics: Denies new lymphadenopathy or easy bruising Neurological:Denies numbness, tingling or new weaknesses Behavioral/Psych: Mood is stable, no new changes  Breast: Palpable lump in the left breast All other systems were reviewed with the patient and are negative.  PHYSICAL EXAMINATION: ECOG PERFORMANCE STATUS: 1 - Symptomatic but completely ambulatory  Vitals:   06/11/17 1522  BP: (!) 174/70  Pulse: 76  Resp: 17  Temp: 98.6 F (37 C)  SpO2: 99%   Filed Weights   06/11/17 1522  Weight: 145 lb 14.4 oz (66.2 kg)    GENERAL:alert, no distress and comfortable SKIN: skin color, texture, turgor are normal, no rashes or significant lesions EYES: normal, conjunctiva are pink and non-injected, sclera clear OROPHARYNX:no exudate, no erythema and lips, buccal mucosa, and tongue normal  NECK: supple, thyroid normal size, non-tender, without nodularity LYMPH:  no palpable lymphadenopathy in the cervical, axillary or inguinal LUNGS: clear to auscultation and  percussion with normal breathing effort HEART: regular rate & rhythm and no murmurs and no lower extremity edema ABDOMEN:abdomen soft, non-tender and normal bowel sounds Musculoskeletal:no cyanosis of digits and no clubbing  PSYCH: alert & oriented x 3 with fluent speech NEURO: no focal motor/sensory deficits BREAST: Palpable lump in left breast. No palpable axillary or supraclavicular lymphadenopathy (exam performed in the presence of a chaperone)   LABORATORY DATA:  I have reviewed the data as listed Lab Results  Component Value Date   WBC 8.3 06/20/2011   HGB 11.9 (L) 11/25/2012   HCT 35.0 (L) 11/25/2012   MCV 89.9 06/20/2011   PLT 295 06/20/2011   Lab Results  Component Value Date   NA 136 11/25/2012   K 5.5 (H) 11/25/2012   CL 103 11/25/2012   CO2 25 11/25/2012    RADIOGRAPHIC STUDIES: I have personally reviewed the radiological reports  and agreed with the findings in the report.  ASSESSMENT AND PLAN:  Malignant neoplasm of upper-outer quadrant of left breast in female, estrogen receptor positive ([place]) 05/16/2017: Patient palpated a nodule in left breast mass at 2 o'clock position: 3.7 x 3.2 x 2.3 cm, additional circumscribed mass 3.2 cm; left breast biopsy at 11 o'clock position: Benign, left breast mass at 2 o'clock position: IDC with DCIS grade 2, ER 100%, PR 70%, Ki-67 10%, HER-2 negative ratio 1.38, T2 N0 stage Ib  Pathology and radiology counseling: Discussed with the patient, the details of pathology including the type of breast cancer,the clinical staging, the significance of ER, PR and HER-2/neu receptors and the implications for treatment. After reviewing the pathology in detail, we proceeded to discuss the different treatment options between surgery, radiation, chemotherapy, antiestrogen therapies.  Recommendation: 1.  Surgery with lumpectomy versus mastectomy 2. Followed by adjuvant radiation +/- 3.  Followed by antiestrogen  therapy --------------------------------------------------------------------------------  Patient missed her appointment for seeing Dr. Dalbert Batman.  We will call Dr. Darrel Hoover office to schedule her back up.  Patient will now wants to get the surgery done as soon as possible because the tumors causes her pain. We discussed about doing staging scans because she does have diffuse bone pain and body pains.  But she has had long-standing history of arthritis diffusely and therefore we decided not to do any scans at this time.  She has a particular pointed pain in the left posterior rib cage area.  return to clinic after surgery to discuss the final pathology report   All questions were answered. The patient knows to call the clinic with any problems, questions or concerns.    Harriette Ohara, MD 06/11/17

## 2017-06-18 ENCOUNTER — Other Ambulatory Visit: Payer: Self-pay | Admitting: General Surgery

## 2017-06-18 DIAGNOSIS — Z170 Estrogen receptor positive status [ER+]: Principal | ICD-10-CM

## 2017-06-18 DIAGNOSIS — Z9181 History of falling: Secondary | ICD-10-CM | POA: Diagnosis not present

## 2017-06-18 DIAGNOSIS — C50412 Malignant neoplasm of upper-outer quadrant of left female breast: Secondary | ICD-10-CM | POA: Diagnosis not present

## 2017-06-18 DIAGNOSIS — Z8673 Personal history of transient ischemic attack (TIA), and cerebral infarction without residual deficits: Secondary | ICD-10-CM | POA: Diagnosis not present

## 2017-06-18 DIAGNOSIS — I2510 Atherosclerotic heart disease of native coronary artery without angina pectoris: Secondary | ICD-10-CM | POA: Diagnosis not present

## 2017-06-20 ENCOUNTER — Telehealth: Payer: Self-pay

## 2017-06-20 NOTE — Telephone Encounter (Signed)
SENT REFERRAL TO SCHEDULING 

## 2017-06-20 NOTE — Telephone Encounter (Signed)
SENT REFERRAL TO SCHEDULING FROM DR Fanny Skates [facility] # [phone]

## 2017-06-24 DIAGNOSIS — C50919 Malignant neoplasm of unspecified site of unspecified female breast: Secondary | ICD-10-CM | POA: Insufficient documentation

## 2017-06-24 DIAGNOSIS — Z01810 Encounter for preprocedural cardiovascular examination: Secondary | ICD-10-CM | POA: Insufficient documentation

## 2017-06-24 HISTORY — DX: Malignant neoplasm of unspecified site of unspecified female breast: C50.919

## 2017-06-24 NOTE — Progress Notes (Signed)
Cardiology Office Note:    Date:  06/25/2017   ID:  [name], DOB 1933/01/14, MRN [id number]  PCP:  Taylor Bunting, MD  Cardiologist:  Taylor More, MD   Referring MD: Taylor Skates, MD  ASSESSMENT:    1. Preoperative cardiovascular examination   2. Coronary artery disease involving native coronary artery of native heart with angina pectoris ([place])   3. Essential hypertension   4. Hyperlipidemia, unspecified hyperlipidemia type   5. Malignant neoplasm of upper-outer quadrant of left breast in female, estrogen receptor positive ([place])    PLAN:    Preoperative cardiovascular evaluation summary  Surgeon: Taylor [name] Procedure: L lumpectomy and sentinel node biopsy The surgery is elective Active cardiac problems  Stable CAD The cardiac status is stable. The planned procedure is intermediate risk. The functional capacity is 4 mets or greater no Recent cardiac tests performed EKG today in my office stable sinus rhythm Given the above his overall risk for the planned procedure is low/acceptable Antiplatelet/ anticoagulant recommendation: She can stop aspirin before surgery please tell her when to resume Other cardiac medication or device recommendation: I have asked her to resume her statin and with uncontrolled hypertension start a low-dose of an ARB Anesthesia recommendation: None Observation, monitoring,and postoperative test recommendation: None the family thinks she is having outpatient surgery The patient is optimized from a cardiology perspective: Yes  Greater than 50% of the 60 minutes was spent in counseling/coordiantion of care regarding reviewing medical records from previous cardiology interactions oncology and surgery and optimizing her planned surgical procedure.  In order of problems listed above:  1.   Stable optimized to proceed with her planned surgery 2.   Stable continue current medical treatment placed outpatient resume aspirin after surgery I would not  restart beta-blocker at this time 3.   She will start a low-dose of an ARB, monitoring of blood pressure 4.    She will resume her simvastatin 5.     Proceed with surgery as planned  Next appointment   Medication Adjustments/Labs and Tests Ordered: Current medicines are reviewed at length with the patient today.  Concerns regarding medicines are outlined above.  No orders of the defined types were placed in this encounter.  Meds ordered this encounter  Medications  . DISCONTD: candesartan (ATACAND) 16 MG tablet    Sig: Take 1 tablet (16 mg total) by mouth daily.    Dispense:  30 tablet    Refill:  3  . DISCONTD: simvastatin (ZOCOR) 40 MG tablet    Sig: Take 1 tablet (40 mg total) by mouth at bedtime.    Dispense:  90 tablet    Refill:  3  . candesartan (ATACAND) 16 MG tablet    Sig: Take 1 tablet (16 mg total) by mouth daily.    Dispense:  30 tablet    Refill:  11  . simvastatin (ZOCOR) 40 MG tablet    Sig: Take 1 tablet (40 mg total) by mouth at bedtime.    Dispense:  90 tablet    Refill:  3     Chief Complaint  Patient presents with  . Referral  . Pre-op Exam  . Coronary Artery Disease  . Hypertension  . Hyperlipidemia  . Breast Cancer    History of Present Illness:    [name] is a 84 y.o. female with breast cancerwho is being seen today for the evaluation of perioperative cardiovascular risk at the request of Taylor Skates, MD. She was seen by Taylor  Glover in 2010 Problem # 1:  HYPERTENSION (ICD-401.9) Blood pressure controlled on present medications. Will continue. Her updated medication list for this problem includes:    Metoprolol Tartrate 50 Mg Tabs (Metoprolol tartrate) .Marland Kitchen... Take one tablet by mouth twice a day    Aspirin 81 Mg Tbec (Aspirin) .Marland Kitchen... Take one tablet by mouth daily Problem # 2:  HYPERLIPIDEMIA (ICD-272.4) Continue statin. Check lipids and liver. Goal LDL less than 70. Her updated medication list for this problem includes:     Simvastatin 40 Mg Tabs (Simvastatin) .Marland Kitchen... Take one tablet by mouth daily at bedtime Problem # 3:  CAD (ICD-414.00) Continue aspirin, beta blocker and statin. She does have known coronary disease but I would be hesitant to proceed with catheterization in the future unless absolutely necessary given prior CVA with previous heart catheterization. Last Myoview in March of this year showed no ischemia and she isn't having no chest pain. Her updated medication list for this problem includes:    Metoprolol Tartrate 50 Mg Tabs (Metoprolol tartrate) .Marland Kitchen... Take one tablet by mouth twice a day    Aspirin 81 Mg Tbec (Aspirin) .Marland Kitchen... Take one tablet by mouth daily Problem # 4:  CVA (ICD-434.91)  Oncology progress note 06/11/17: ASSESSMENT AND PLAN:  Malignant neoplasm of upper-outer quadrant of left breast in female, estrogen receptor positive ([place]) 05/16/2017: Patient palpated a nodule in left breast mass at 2 o'clock position: 3.7 x 3.2 x 2.3 cm, additional circumscribed mass 3.2 cm; left breast biopsy at 11 o'clock position: Benign, left breast mass at 2 o'clock position: IDC with DCIS grade 2, ER 100%, PR 70%, Ki-67 10%, HER-2 negative ratio 1.38, T2 N0 stage Ib Pathology and radiology counseling: Discussed with the patient, the details of pathology including the type of breast cancer,the clinical staging, the significance of ER, PR and HER-2/neu receptors and the implications for treatment. After reviewing the pathology in detail, we proceeded to discuss the different treatment options between surgery, radiation, chemotherapy, antiestrogen therapies. Recommendation: 1.  Surgery with lumpectomy versus mastectomy 2. Followed by adjuvant radiation +/- 3.  Followed by antiestrogen therapy  On cardiac perspective she is done well her daughter thinks that she has not taken her beta-blocker statin for years.  She has had no angina dyspnea palpitation or syncope but her exercise tolerance is limited.  I do not  think she requires a preoperative ischemia evaluation he has no evidence of heart failure or valvular heart disease by physical examination and her EKG is stable.  Her blood pressure was markedly elevated at her recent office visit and today and I start her on a low-dose of an ARB and the daughter will follow home blood pressures.  I asked her to restart her statin in view of her established CAD simvastatin 40 mg daily.  It is anticipated by the family she will have same-day surgery.  I instructed the daughter that she can stop her aspirin 5-7 days before surgery and asked the surgeon when it can be resumed afterwards.  Past Medical History:  Diagnosis Date  . Altered mental status 04/26/2011  . ANEMIA 03/07/2009   Qualifier: Diagnosis of  By: Burnett Kanaris    . Arthritis   . Basal cell carcinoma of skin of eyelid, including canthus 10/08/2012  . Breast cancer ([place]) 06/24/2017  . CEREBROVASCULAR DISEASE 03/08/2009   Qualifier: Diagnosis of  By: Stanford Breed, MD, Kandyce Rud   . Coronary artery disease   . Diabetes mellitus   . Diabetes mellitus ([place])  09/23/2012  . Diverticulosis   . DM 03/07/2009   Qualifier: Diagnosis of  By: Burnett Kanaris    . Dyslipidemia 09/23/2012  . GERD 03/07/2009   Qualifier: Diagnosis of  By: Burnett Kanaris    . GERD (gastroesophageal reflux disease)   . Gout   . Gout   . H/O: CVA (cerebrovascular accident) 09/23/2012  . History of gastroesophageal reflux (GERD) 09/23/2012  . History of myocardial infarction 09/23/2012  . HTN (hypertension) 09/23/2012  . Hyperlipemia   . HYPERLIPIDEMIA 03/07/2009   Qualifier: Diagnosis of  By: Burnett Kanaris    . Hypertension   . IBS (irritable bowel syndrome)   . Irritable bowel syndrome 03/07/2009   Qualifier: Diagnosis of  By: Burnett Kanaris    . Malignant neoplasm of upper-outer quadrant of left breast in female, estrogen receptor positive ([place]) 05/22/2017  . Preop cardiovascular exam 06/24/2017  . Stroke [facility])       Past Surgical History:  Procedure Laterality Date  . ABDOMINAL HYSTERECTOMY    . APPENDECTOMY    . BACK SURGERY    . cataract surgery      Current Medications: Current Meds  Medication Sig  . aspirin EC 81 MG tablet Take 81 mg by mouth daily.  Marland Kitchen escitalopram (LEXAPRO) 10 MG tablet Take by mouth.  Marland Kitchen HYDROcodone-acetaminophen (NORCO) 10-325 MG per tablet Take 1 tablet by mouth every 4 (four) hours as needed. For pain.  Marland Kitchen insulin aspart (NOVOLOG) 100 UNIT/ML injection Inject into the skin 3 (three) times daily before meals. Sliding scale  . insulin detemir (LEVEMIR) 100 UNIT/ML injection Inject 6 Units into the skin 2 (two) times daily.  . temazepam (RESTORIL) 15 MG capsule Take 15 mg by mouth at bedtime as needed. For sleep.     Allergies:   Cephalexin and Ivp dye [iodinated diagnostic agents]   Social History   Socioeconomic History  . Marital status: Married    Spouse name: Not on file  . Number of children: Not on file  . Years of education: Not on file  . Highest education level: Not on file  Occupational History  . Not on file  Social Needs  . Financial resource strain: Not on file  . Food insecurity:    Worry: Not on file    Inability: Not on file  . Transportation needs:    Medical: Not on file    Non-medical: Not on file  Tobacco Use  . Smoking status: Never Smoker  . Smokeless tobacco: Never Used  Substance and Sexual Activity  . Alcohol use: No    Comment: Unable to ascertain due to altered mental status.  . Drug use: No  . Sexual activity: Not on file  Lifestyle  . Physical activity:    Days per week: Not on file    Minutes per session: Not on file  . Stress: Not on file  Relationships  . Social connections:    Talks on phone: Not on file    Gets together: Not on file    Attends religious service: Not on file    Active member of club or organization: Not on file    Attends meetings of clubs or organizations: Not on file    Relationship status:  Not on file  Other Topics Concern  . Not on file  Social History Narrative  . Not on file     Family History: The patient's family history includes Breast cancer (age of onset: 13) in her daughter; Cancer in her  father and mother.  ROS:   Review of Systems  Constitution: Negative.  HENT: Positive for hearing loss.   Eyes: Negative.   Cardiovascular: Negative.   Respiratory: Negative.   Endocrine: Negative.   Hematologic/Lymphatic: Bruises/bleeds easily.  Skin: Negative.   Musculoskeletal: Positive for back pain and joint pain.  Gastrointestinal: Negative.   Genitourinary: Negative.   Neurological: Negative.   Psychiatric/Behavioral: Positive for depression.  Allergic/Immunologic: Negative.    Please see the history of present illness.     All other systems reviewed and are negative.  EKGs/Labs/Other Studies Reviewed:    The following studies were reviewed today:   EKG:  EKG is  ordered today.  The ekg ordered today demonstrates [place] non specific T waves  Recent Labs: No results found for requested labs within last 8760 hours.  Recent Lipid Panel    Component Value Date/Time   CHOL 127 09/08/2007 0956   TRIG 45 09/08/2007 0956   HDL 60.9 09/08/2007 0956   CHOLHDL 2.1 CALC 09/08/2007 0956   VLDL 9 09/08/2007 0956   LDLCALC 57 09/08/2007 0956    Physical Exam:    VS:  BP (!) 176/88 (BP Location: Right Arm, Patient Position: Sitting, Cuff Size: Normal)   Pulse 88   Ht _0  (1.651 m)   Wt 151 lb 6.4 oz (68.7 kg)   SpO2 95%   BMI 25.19 kg/m     Wt Readings from Last 3 Encounters:  06/25/17 151 lb 6.4 oz (68.7 kg)  06/11/17 145 lb 14.4 oz (66.2 kg)  04/27/11 156 lb (70.8 kg)     GEN:  Well nourished, well developed in no acute distress HEENT: Normal NECK: No JVD; No carotid bruits LYMPHATICS: No lymphadenopathy CARDIAC: RRR, no murmurs, rubs, gallops RESPIRATORY:  Clear to auscultation without rales, wheezing or rhonchi  ABDOMEN: Soft, non-tender,  non-distended MUSCULOSKELETAL:  No edema; No deformity  SKIN: Warm and dry NEUROLOGIC:  Alert and oriented x 3 PSYCHIATRIC:  Normal affect     Signed, Taylor More, MD  06/25/2017 12:44 PM    [facility]

## 2017-06-25 ENCOUNTER — Encounter: Payer: Self-pay | Admitting: Cardiology

## 2017-06-25 ENCOUNTER — Ambulatory Visit: Payer: Medicare Other | Admitting: Cardiology

## 2017-06-25 VITALS — BP 176/88 | HR 88 | Ht 65.0 in | Wt 151.4 lb

## 2017-06-25 DIAGNOSIS — C50412 Malignant neoplasm of upper-outer quadrant of left female breast: Secondary | ICD-10-CM | POA: Diagnosis not present

## 2017-06-25 DIAGNOSIS — Z01810 Encounter for preprocedural cardiovascular examination: Secondary | ICD-10-CM | POA: Diagnosis not present

## 2017-06-25 DIAGNOSIS — I10 Essential (primary) hypertension: Secondary | ICD-10-CM | POA: Diagnosis not present

## 2017-06-25 DIAGNOSIS — I25119 Atherosclerotic heart disease of native coronary artery with unspecified angina pectoris: Secondary | ICD-10-CM

## 2017-06-25 DIAGNOSIS — Z170 Estrogen receptor positive status [ER+]: Secondary | ICD-10-CM

## 2017-06-25 DIAGNOSIS — E785 Hyperlipidemia, unspecified: Secondary | ICD-10-CM | POA: Diagnosis not present

## 2017-06-25 MED ORDER — SIMVASTATIN 40 MG PO TABS: 40.0000 mg | ORAL_TABLET | Freq: Every day | ORAL | 3 refills | Status: DC

## 2017-06-25 MED ORDER — CANDESARTAN CILEXETIL 16 MG PO TABS: 16.0000 mg | ORAL_TABLET | Freq: Every day | ORAL | 3 refills | Status: DC

## 2017-06-25 MED ORDER — CANDESARTAN CILEXETIL 16 MG PO TABS: 16.0000 mg | ORAL_TABLET | Freq: Every day | ORAL | 11 refills | Status: DC

## 2017-06-25 NOTE — Addendum Note (Signed)
Addended by: Warner Mccreedy E on: 06/25/2017 02:29 PM   Modules accepted: Orders

## 2017-06-25 NOTE — Patient Instructions (Signed)
Medication Instructions:  Your physician has recommended you make the following change in your medication:  START candesartan 16 mg daily RESTART simvastatin 40 mg daily  Check blood pressure at home in your right arm at the same time everyday.  Labwork: None  Testing/Procedures: None  Follow-Up: Your physician recommends that you schedule a follow-up appointment in: as needed if symptoms worsen or fail to improve.  Any Other Special Instructions Will Be Listed Below (If Applicable).     If you need a refill on your cardiac medications before your next appointment, please call your pharmacy.

## 2017-06-27 ENCOUNTER — Telehealth: Payer: Self-pay | Admitting: Cardiology

## 2017-06-27 NOTE — Telephone Encounter (Signed)
Please call patient's daughter regarding the meds that Dr Bettina Gavia put in for her and Ins will not cover.  She thinks it may be the fact that it is not generic??

## 2017-06-27 NOTE — Telephone Encounter (Signed)
Left message to return call 

## 2017-06-27 NOTE — Telephone Encounter (Signed)
Advised we are waiting to hear from the pharmacy what is on formulary for the patient. Verbalized understanding. No further questions.

## 2017-06-28 MED ORDER — TELMISARTAN 20 MG PO TABS: 20.0000 mg | ORAL_TABLET | Freq: Every day | ORAL | 11 refills | Status: DC

## 2017-06-28 NOTE — Telephone Encounter (Signed)
Change from candesartan to telmisartan 20 mg daily per verbal order from Dr. Bettina Gavia. Prescription sent.

## 2017-06-28 NOTE — Telephone Encounter (Signed)
The pharmacy has no way to know what is on formulary until they run a prescription. We stopped losartan. Candesartan is not on formulary. Please advise another ARB.

## 2017-07-01 ENCOUNTER — Other Ambulatory Visit: Payer: Self-pay | Admitting: General Surgery

## 2017-07-01 ENCOUNTER — Other Ambulatory Visit: Payer: Self-pay | Admitting: Family Medicine

## 2017-07-01 DIAGNOSIS — C50412 Malignant neoplasm of upper-outer quadrant of left female breast: Secondary | ICD-10-CM

## 2017-07-01 DIAGNOSIS — Z170 Estrogen receptor positive status [ER+]: Principal | ICD-10-CM

## 2017-07-03 ENCOUNTER — Encounter (HOSPITAL_COMMUNITY): Payer: Self-pay

## 2017-07-03 DIAGNOSIS — E1151 Type 2 diabetes mellitus with diabetic peripheral angiopathy without gangrene: Secondary | ICD-10-CM | POA: Diagnosis not present

## 2017-07-03 NOTE — Pre-Procedure Instructions (Signed)
[name]  07/03/2017      [id number] [address], [place] AT [address] [place] [address] Phone: [phone] Fax: [phone]    Your procedure is scheduled on Monday April 8.  Report to [facility] Admitting at 11:00 P.M.  Call this number if you have problems the morning of surgery:  [phone]    Remember:  Do not eat food or drink liquids after midnight.  **DRINK Ensure Pre-surgery drink prior to leaving home the morning of surgery**   Take these medicines the morning of surgery with A SIP OF WATER:   Escitalopram (Lexapro) Omeprazole (prilosec) Hydrocodone-acetaminophen (Norco) if needed  Take HALF dose of Insulin Levemir the night before surgery. (3 units) Take HALF dose of insulin Levemir the day of surgery. (3 units)  7 days prior to surgery STOP taking any Aleve, Naproxen, Ibuprofen, Motrin, Advil, Goody's, BC's, all herbal medications, fish oil, and all vitamins  **FOLLOW your surgeon's instructions on stopping Aspirin. If no instructions given please call your surgeon's office**  If your CBG is greater than 220 mg/dL, you may take  of your sliding scale (correction) dose of insulin. (Insulin Novolog)    How to Manage Your Diabetes Before and After Surgery  Why is it important to control my blood sugar before and after surgery? . Improving blood sugar levels before and after surgery helps healing and can limit problems. . A way of improving blood sugar control is eating a healthy diet by: o  Eating less sugar and carbohydrates o  Increasing activity/exercise o  Talking with your doctor about reaching your blood sugar goals . High blood sugars (greater than 180 mg/dL) can raise your risk of infections and slow your recovery, so you will need to focus on controlling your diabetes during the weeks before surgery. . Make sure that the doctor who takes  care of your diabetes knows about your planned surgery including the date and location.  How do I manage my blood sugar before surgery? . Check your blood sugar at least 4 times a day, starting 2 days before surgery, to make sure that the level is not too high or low. o Check your blood sugar the morning of your surgery when you wake up and every 2 hours until you get to the Short Stay unit. . If your blood sugar is less than 70 mg/dL, you will need to treat for low blood sugar: o Do not take insulin. o Treat a low blood sugar (less than 70 mg/dL) with  cup of clear juice (cranberry or apple), 4 glucose tablets, OR glucose gel. Recheck blood sugar in 15 minutes after treatment (to make sure it is greater than 70 mg/dL). If your blood sugar is not greater than 70 mg/dL on recheck, call [phone] o  for further instructions. . Report your blood sugar to the short stay nurse when you get to Short Stay.  . If you are admitted to the hospital after surgery: o Your blood sugar will be checked by the staff and you will probably be given insulin after surgery (instead of oral diabetes medicines) to make sure you have good blood sugar levels. o The goal for blood sugar control after surgery is 80-180 mg/dL.    Do not wear jewelry, make-up or nail polish.  Do not wear lotions, powders, or perfumes, or deodorant.  Do not shave 48 hours prior to  surgery.  Men may shave face and neck.  Do not bring valuables to the hospital.  [facility] is not responsible for any belongings or valuables.  Contacts, dentures or bridgework may not be worn into surgery.  Leave your suitcase in the car.  After surgery it may be brought to your room.  For patients admitted to the hospital, discharge time will be determined by your treatment team.  Patients discharged the day of surgery will not be allowed to drive home.   Special instructions:    [place]- Preparing For Surgery  Before surgery, you can play an  important role. Because skin is not sterile, your skin needs to be as free of germs as possible. You can reduce the number of germs on your skin by washing with CHG (chlorahexidine gluconate) Soap before surgery.  CHG is an antiseptic cleaner which kills germs and bonds with the skin to continue killing germs even after washing.  Please do not use if you have an allergy to CHG or antibacterial soaps. If your skin becomes reddened/irritated stop using the CHG.  Do not shave (including legs and underarms) for at least 48 hours prior to first CHG shower. It is OK to shave your face.  Please follow these instructions carefully.   1. Shower the NIGHT BEFORE SURGERY and the MORNING OF SURGERY with CHG.   2. If you chose to wash your hair, wash your hair first as usual with your normal shampoo.  3. After you shampoo, rinse your hair and body thoroughly to remove the shampoo.  4. Use CHG as you would any other liquid soap. You can apply CHG directly to the skin and wash gently with a scrungie or a clean washcloth.   5. Apply the CHG Soap to your body ONLY FROM THE NECK DOWN.  Do not use on open wounds or open sores. Avoid contact with your eyes, ears, mouth and genitals (private parts). Wash Face and genitals (private parts)  with your normal soap.  6. Wash thoroughly, paying special attention to the area where your surgery will be performed.  7. Thoroughly rinse your body with warm water from the neck down.  8. DO NOT shower/wash with your normal soap after using and rinsing off the CHG Soap.  9. Pat yourself dry with a CLEAN TOWEL.  10. Wear CLEAN PAJAMAS to bed the night before surgery, wear comfortable clothes the morning of surgery  11. Place CLEAN SHEETS on your bed the night of your first shower and DO NOT SLEEP WITH PETS.    Day of Surgery: Do not apply any deodorants/lotions. Please wear clean clothes to the hospital/surgery center.      Please read over the following fact sheets  that you were given. Coughing and Deep Breathing and Surgical Site Infection Prevention

## 2017-07-03 NOTE — Pre-Procedure Instructions (Signed)
Kery Haltiwanger Melamed  07/03/2017      [id number] [address], [place] AT [address] [address] Phone: [phone] Fax: [phone]    Your procedure is scheduled on April 8  Report to [place] at 1100 A.M.  Call this number if you have problems the morning of surgery:  [phone]   Remember:  Do not eat food or drink liquids after midnight.  Take these medicines the morning of surgery with A SIP OF WATER Escitalopram (Lexapro), Hydrocodone (Norco), Omeprazole (Prilosec)  Stop taking aspirin, BC's, Goody's, Herbal medications, Vitamins, Aleve, Ibuprofen, Advil, Motrin    How to Manage Your Diabetes Before and After Surgery  Why is it important to control my blood sugar before and after surgery? . Improving blood sugar levels before and after surgery helps healing and can limit problems. . A way of improving blood sugar control is eating a healthy diet by: o  Eating less sugar and carbohydrates o  Increasing activity/exercise o  Talking with your doctor about reaching your blood sugar goals . High blood sugars (greater than 180 mg/dL) can raise your risk of infections and slow your recovery, so you will need to focus on controlling your diabetes during the weeks before surgery. . Make sure that the doctor who takes care of your diabetes knows about your planned surgery including the date and location.  How do I manage my blood sugar before surgery? . Check your blood sugar at least 4 times a day, starting 2 days before surgery, to make sure that the level is not too high or low. o Check your blood sugar the morning of your surgery when you wake up and every 2 hours until you get to the Short Stay unit. . If your blood sugar is less than 70 mg/dL, you will need to treat for low blood sugar: o Do not take insulin. o Treat a low blood sugar (less than 70 mg/dL)  with  cup of clear juice (cranberry or apple), 4 glucose tablets, OR glucose gel. Recheck blood sugar in 15 minutes after treatment (to make sure it is greater than 70 mg/dL). If your blood sugar is not greater than 70 mg/dL on recheck, call [phone] o  for further instructions. . Report your blood sugar to the short stay nurse when you get to Short Stay.  . If you are admitted to the hospital after surgery: o Your blood sugar will be checked by the staff and you will probably be given insulin after surgery (instead of oral diabetes medicines) to make sure you have good blood sugar levels. o The goal for blood sugar control after surgery is 80-180 mg/dL.              WHAT DO I DO ABOUT MY DIABETES MEDICATION?   Marland Kitchen Do not take oral diabetes medicines (pills) the morning of surgery.  . THE NIGHT BEFORE SURGERY, take ___________ units of ___________insulin.       . THE MORNING OF SURGERY, take 3 units of  Levemir insulin.  . The day of surgery, do not take other diabetes injectables, including Byetta (exenatide), Bydureon (exenatide ER), Victoza (liraglutide), or Trulicity (dulaglutide).  . If your CBG is greater than 220 mg/dL, you may take  of your sliding scale (correction) dose of insulin.  Other Instructions:          Patient Signature:  Date:  Nurse Signature:  Date:   Reviewed and Endorsed by [facility] Patient Education Committee, August 2015  Do not wear jewelry, make-up or nail polish.  Do not wear lotions, powders, or perfumes, or deodorant.  Do not shave 48 hours prior to surgery.  Men may shave face and neck.  Do not bring valuables to the hospital.  [facility] is not responsible for any belongings or valuables.  Contacts, dentures or bridgework may not be worn into surgery.  Leave your suitcase in the car.  After surgery it may be brought to your room.  For patients admitted to the hospital, discharge time will be determined by your treatment  team.  Patients discharged the day of surgery will not be allowed to drive home.  Special instructions:   [place]- Preparing For Surgery  Before surgery, you can play an important role. Because skin is not sterile, your skin needs to be as free of germs as possible. You can reduce the number of germs on your skin by washing with CHG (chlorahexidine gluconate) Soap before surgery.  CHG is an antiseptic cleaner which kills germs and bonds with the skin to continue killing germs even after washing.  Please do not use if you have an allergy to CHG or antibacterial soaps. If your skin becomes reddened/irritated stop using the CHG.  Do not shave (including legs and underarms) for at least 48 hours prior to first CHG shower. It is OK to shave your face.  Please follow these instructions carefully.   1. Shower the NIGHT BEFORE SURGERY and the MORNING OF SURGERY with CHG.   2. If you chose to wash your hair, wash your hair first as usual with your normal shampoo.  3. After you shampoo, rinse your hair and body thoroughly to remove the shampoo.  4. Use CHG as you would any other liquid soap. You can apply CHG directly to the skin and wash gently with a scrungie or a clean washcloth.   5. Apply the CHG Soap to your body ONLY FROM THE NECK DOWN.  Do not use on open wounds or open sores. Avoid contact with your eyes, ears, mouth and genitals (private parts). Wash Face and genitals (private parts)  with your normal soap.  6. Wash thoroughly, paying special attention to the area where your surgery will be performed.  7. Thoroughly rinse your body with warm water from the neck down.  8. DO NOT shower/wash with your normal soap after using and rinsing off the CHG Soap.  9. Pat yourself dry with a CLEAN TOWEL.  10. Wear CLEAN PAJAMAS to bed the night before surgery, wear comfortable clothes the morning of surgery  11. Place CLEAN SHEETS on your bed the night of your first shower and DO NOT SLEEP  WITH PETS.    Day of Surgery: Do not apply any deodorants/lotions. Please wear clean clothes to the hospital/surgery center.      Please read over the following fact sheets that you were given. Pain Booklet, Coughing and Deep Breathing and Surgical Site Infection Prevention

## 2017-07-04 ENCOUNTER — Encounter (HOSPITAL_COMMUNITY): Payer: Self-pay

## 2017-07-04 ENCOUNTER — Encounter (HOSPITAL_COMMUNITY)
Admission: RE | Admit: 2017-07-04 | Discharge: 2017-07-04 | Disposition: A | Discharge status: Home / Self Care | Payer: Medicare Other | Source: Ambulatory Visit | Attending: General Surgery | Admitting: General Surgery

## 2017-07-04 ENCOUNTER — Other Ambulatory Visit: Payer: Self-pay

## 2017-07-04 DIAGNOSIS — E119 Type 2 diabetes mellitus without complications: Secondary | ICD-10-CM | POA: Insufficient documentation

## 2017-07-04 DIAGNOSIS — Z170 Estrogen receptor positive status [ER+]: Secondary | ICD-10-CM | POA: Insufficient documentation

## 2017-07-04 DIAGNOSIS — Z7982 Long term (current) use of aspirin: Secondary | ICD-10-CM | POA: Diagnosis not present

## 2017-07-04 DIAGNOSIS — C50412 Malignant neoplasm of upper-outer quadrant of left female breast: Secondary | ICD-10-CM | POA: Insufficient documentation

## 2017-07-04 DIAGNOSIS — I25119 Atherosclerotic heart disease of native coronary artery with unspecified angina pectoris: Secondary | ICD-10-CM | POA: Insufficient documentation

## 2017-07-04 DIAGNOSIS — Z79899 Other long term (current) drug therapy: Secondary | ICD-10-CM | POA: Insufficient documentation

## 2017-07-04 DIAGNOSIS — Z01812 Encounter for preprocedural laboratory examination: Secondary | ICD-10-CM | POA: Insufficient documentation

## 2017-07-04 DIAGNOSIS — I10 Essential (primary) hypertension: Secondary | ICD-10-CM | POA: Insufficient documentation

## 2017-07-04 DIAGNOSIS — Z8673 Personal history of transient ischemic attack (TIA), and cerebral infarction without residual deficits: Secondary | ICD-10-CM | POA: Insufficient documentation

## 2017-07-04 DIAGNOSIS — E785 Hyperlipidemia, unspecified: Secondary | ICD-10-CM | POA: Insufficient documentation

## 2017-07-04 HISTORY — DX: Depression, unspecified: F32.A

## 2017-07-04 HISTORY — DX: Major depressive disorder, single episode, unspecified: F32.9

## 2017-07-04 LAB — COMPREHENSIVE METABOLIC PANEL
ALBUMIN: 3.7 g/dL (ref 3.5–5.0)
ALT: 7 U/L — ABNORMAL LOW (ref 14–54)
AST: 16 U/L (ref 15–41)
Alkaline Phosphatase: 128 U/L — ABNORMAL HIGH (ref 38–126)
Anion gap: 11 (ref 5–15)
BILIRUBIN TOTAL: 0.5 mg/dL (ref 0.3–1.2)
BUN: 13 mg/dL (ref 6–20)
CO2: 22 mmol/L (ref 22–32)
Calcium: 8.7 mg/dL — ABNORMAL LOW (ref 8.9–10.3)
Chloride: 106 mmol/L (ref 101–111)
Creatinine, Ser: 0.91 mg/dL (ref 0.44–1.00)
GFR calc non Af Amer: 57 mL/min — ABNORMAL LOW (ref >60)
GLUCOSE: 101 mg/dL — AB (ref 65–99)
POTASSIUM: 4 mmol/L (ref 3.5–5.1)
Sodium: 139 mmol/L (ref 135–145)
TOTAL PROTEIN: 6.8 g/dL (ref 6.5–8.1)

## 2017-07-04 LAB — GLUCOSE, CAPILLARY: GLUCOSE-CAPILLARY: 64 mg/dL — AB (ref 65–99)

## 2017-07-04 LAB — CBC WITH DIFFERENTIAL/PLATELET
BASOS PCT: 1 %
Basophils Absolute: 0 10*3/uL (ref 0.0–0.1)
EOS ABS: 0.2 10*3/uL (ref 0.0–0.7)
Eosinophils Relative: 3 %
HEMATOCRIT: 32.9 % — AB (ref 36.0–46.0)
Hemoglobin: 10.3 g/dL — ABNORMAL LOW (ref 12.0–15.0)
Lymphocytes Relative: 29 %
Lymphs Abs: 1.7 10*3/uL (ref 0.7–4.0)
MCH: 29.9 pg (ref 26.0–34.0)
MCHC: 31.3 g/dL (ref 30.0–36.0)
MCV: 95.6 fL (ref 78.0–100.0)
MONOS PCT: 13 %
Monocytes Absolute: 0.8 10*3/uL (ref 0.1–1.0)
NEUTROS ABS: 3.1 10*3/uL (ref 1.7–7.7)
Neutrophils Relative %: 54 %
Platelets: 409 10*3/uL — ABNORMAL HIGH (ref 150–400)
RBC: 3.44 MIL/uL — ABNORMAL LOW (ref 3.87–5.11)
RDW: 14.3 % (ref 11.5–15.5)
WBC: 5.8 10*3/uL (ref 4.0–10.5)

## 2017-07-04 LAB — HEMOGLOBIN A1C
HEMOGLOBIN A1C: 7.7 % — AB (ref 4.8–5.6)
MEAN PLASMA GLUCOSE: 174.29 mg/dL

## 2017-07-04 NOTE — Progress Notes (Signed)
PCP is Dr. Burnard Bunting Cardiologist is Dr Bettina Gavia last saw him 06-25-17 (see note cleared for surgery) Reports she stopped taking aspirin 1 week ago. Denies chest pain, cough, or fever. Seed will be placed Monday at 845 on 07-08-17 Reports fasting CBG's run on average 120's today CBG was 64 given a coke to drink daughter states she will get her something to eat once they get to the car. Pt states she feels fine.

## 2017-07-05 ENCOUNTER — Encounter (HOSPITAL_COMMUNITY): Payer: Self-pay

## 2017-07-05 ENCOUNTER — Telehealth: Payer: Self-pay | Admitting: Hematology and Oncology

## 2017-07-05 NOTE — Telephone Encounter (Signed)
Mailed patient calendar of upcoming April appointments per 4/5 sch message.

## 2017-07-05 NOTE — Telephone Encounter (Signed)
Left message for patients daughter regarding upcoming April appointments.  °

## 2017-07-05 NOTE — Progress Notes (Signed)
Anesthesia Chart Review:  Patient is an 84 year old female scheduled for left breast lumpectomy with radioactive seed localization on 07/08/2017 with Fanny Skates, MD  - PCP is Burnard Bunting, MD - Cardiologist is Shirlee More, MD. Pt cleared for surgery at last office visit 06/25/17. His note indicates pt had stress test in "March of this year"; I could not find any evidence of this stress test.  I spoke with Michaela in Dr. Joya Gaskins office who conferred with him; pt has not had a stress test in over a decade, however Dr. Bettina Gavia still feels pt is ok to proceed with surgery as scheduled without further testing.   PMH includes: CAD HTN, DM, hyperlipidemia, CVA (had stroke during cardiac cath 2006), breast cancer, GERD.  Never smoker.  BMI 25.  Medications include: ASA 81 mg, NovoLog, Levemir, Prilosec, simvastatin, telmisartan  BP (!) 171/76   Pulse 100   Temp 37 C   Resp 20   Ht 5\' 5"  (1.651 m)   Wt 149 lb 1.6 oz (67.6 kg)   SpO2 93%   BMI 24.81 kg/m   Preoperative labs reviewed.   - HbA1c 7.7, glucose 101  EKG 06/25/17: sinus rhythm. Nonspecific T wave abnormaltiy  Nuclear stress test 05/02/05: Negative pharmacologic stress nuclear study. - By notes, pt also had stress tests with [facility] in 2009 and 2010 that were normal; I have not been able to locate these results.    Cardiac cath 07/18/04:  1.  Well-preserved left ventricular function. 2.  Tandem lesions in the LAD (80%) and diagonal (80% stenosis)  3.  Mild irregularity of the right coronary artery (40% proximal stenosis). 4.  Question catheterization-related transient ischemic attack. - DISPOSITION: We will get a neurology consult.  The patient appears intact except for the right facial numbness. We will try to normalize her sugar at the present time.  She will also be treated with Plavix with a plan towards PCI later. (Note: pt indeed had a stroke; discharge summary states CAD will be treated medically)  If no changes,  I anticipate pt can proceed with surgery as scheduled.   Willeen Cass, FNP-BC [facility] Short Stay Surgical Center/Anesthesiology Phone: [phone] 07/05/2017 2:45 PM

## 2017-07-07 NOTE — H&P (Signed)
[name] Location: [facility] Patient #: [id number] DOB: 1933/01/14 Widowed / Language: Cleophus Molt / Race: White Female       History of Present Illness       The patient is a 84 year old female who presents with breast cancer. This is a very pleasant 84 year old woman, referred by Dr. Shelly Bombard at the [facility] for evaluation and management of a left breast cancer. She has already seen Dr. Lindi Adie. Dr. Reynaldo Minium is her PCP. Dr. Lia Foyer used to be her cardiologist         She has no prior history of breast problems. She felt a lump in her left breast laterally about 4 months ago. She had mammograms and ultrasound on May 16, 2017. Category C density. There is a 3.7 cm mass in the left breast at the 2 o'clock position. This is palpable. Biopsy shows invasive ductal carcinoma grade 2. ER 100%. PR 70%. HER-2 negative. Ki-67 6. There was an additional mass in the left breast, far posterior, medial 11 o'clock position, 9 cm from the nipple, 3.2 cm. This was biopsied and showed benign breast parenchyma. Radiology felt this was concordant. Ultrasound the axilla is negative      Past history reveals that she had a stroke many years ago. She has coronary artery disease and the daughter reports that she was having a cardiac cath for a stent placement for a possible mild myocardial infarction and she had a stroke and never had the stent placed. She is not actively followed by cardiology. She doesn't have chest pain or shortness of breath but is not very active. She has hypertension. Hyperlipidemia. Insulin-dependent diabetes. TAH without BSO. Appendectomy. Back surgery. She has had some falls most recently 2 months ago without major in injury. She walks with a scooter at home and is in a wheelchair when she goes out. Family history reveals that her daughter, who is with her, has had surgery and treatment for 2 separate breast cancers. Dr. Excell Seltzer is her surgeon. Social  history reveals that she is a widow. Has the single child. She lives at her home in [place] but her grandson lives with her. He takes care of her. Denies alcohol or tobacco      We had a very long talk about surgical approaches and comprehensive care of her cancer. She will not receive chemotherapy but will be offered antiestrogen therapy. It is not clear whether she will be offered radiation therapy regardless of what I do surgically. We talked about mastectomy, lumpectomy, sentinel node biopsy. We talked about the advantages and disadvantages of each approach. The patient, her daughter, and I both favor a generous lumpectomy of the lateral left breast and maybe sentinel node biopsy, maybe not. I think she will recover better from this surgery then she would with a mastectomy with tubes and drains. More likely to be in a nursing home postop with mastectomy. Hopefully can get back to her house with the lumpectomy. Decisions about radiation therapy will be made later but I doubt that she would do very well with that because of her functional status  Plan: Referred to cardiology for risk assessment and clearance Plan left breast lumpectomy with radioactive seed localization and left axillary sentinel lymph node biopsy. Present at breast conference. Since her axilla is clinically negative we might be able to avoid the sentinel node biopsy if that is the consensus Plan outpatient surgery if she does well in recovery room   Past Surgical History  Breast  Biopsy  Left. Foot Surgery  Left. Hysterectomy (not due to cancer) - Partial  Spinal Surgery - Lower Back   Diagnostic Studies History  Colonoscopy  >10 years ago Mammogram  within last year Pap Smear  >5 years ago  Allergies Dyes  Allergies Reconciled   Medication History  Aspirin (81MG Tablet, Oral) Active. NovoLOG (100UNIT/ML Solution, Subcutaneous) Active. Levemir (100UNIT/ML Solution, Subcutaneous)  Active. Methocarbamol (750MG Tablet, Oral) Active. Metoprolol Succinate ER (25MG Tablet ER 24HR, Oral) Active. Simvastatin (40MG Tablet, Oral) Active. Restoril (15MG Capsule, Oral) Active. Medications Reconciled  Social History  Caffeine use  Carbonated beverages. No alcohol use  No drug use  Tobacco use  Never smoker.  Family History  Breast Cancer  Daughter. Cancer  Father, Mother.  Pregnancy / Birth History  Age at menarche  43 years. Age of menopause  57-60 Gravida  3 Irregular periods  Maternal age  71-25 Para  84  Other Problems Arthritis  Back Pain  Breast Cancer  Cerebrovascular Accident  Depression  Diabetes Mellitus  Gastroesophageal Reflux Disease  Lump In Breast  Myocardial infarction     Review of Systems General Present- Weight Loss. Not Present- Appetite Loss, Chills, Fatigue, Fever, Night Sweats and Weight Gain. Skin Not Present- Change in Wart/Mole, Dryness, Hives, Jaundice, New Lesions, Non-Healing Wounds, Rash and Ulcer. HEENT Present- Hearing Loss and Wears glasses/contact lenses. Not Present- Earache, Hoarseness, Nose Bleed, Oral Ulcers, Ringing in the Ears, Seasonal Allergies, Sinus Pain, Sore Throat, Visual Disturbances and Yellow Eyes. Respiratory Not Present- Bloody sputum, Chronic Cough, Difficulty Breathing, Snoring and Wheezing. Breast Present- Breast Mass and Breast Pain. Not Present- Nipple Discharge and Skin Changes. Cardiovascular Not Present- Chest Pain, Difficulty Breathing Lying Down, Leg Cramps, Palpitations, Rapid Heart Rate, Shortness of Breath and Swelling of Extremities. Gastrointestinal Present- Constipation and Indigestion. Not Present- Abdominal Pain, Bloating, Bloody Stool, Change in Bowel Habits, Chronic diarrhea, Difficulty Swallowing, Excessive gas, Gets full quickly at meals, Hemorrhoids, Nausea, Rectal Pain and Vomiting. Musculoskeletal Present- Back Pain, Joint Pain, Joint Stiffness and Muscle  Weakness. Not Present- Muscle Pain and Swelling of Extremities. Neurological Present- Decreased Memory and Trouble walking. Not Present- Fainting, Headaches, Numbness, Seizures, Tingling, Tremor and Weakness. Psychiatric Present- Depression. Not Present- Anxiety, Bipolar, Change in Sleep Pattern, Fearful and Frequent crying. Hematology Present- Easy Bruising. Not Present- Blood Thinners, Excessive bleeding, Gland problems, HIV and Persistent Infections.  Vitals  Weight: 146 lb Height: 64in Body Surface Area: 1.71 m Body Mass Index: 25.06 kg/m  Temp.: 98.21F  Pulse: 72 (Regular)  BP: 110/68 (Sitting, Left Arm, Standard)    Physical Exam General Mental Status-Alert. General Appearance-Consistent with stated age. Hydration-Well hydrated. Voice-Normal.  Head and Neck Head-normocephalic, atraumatic with no lesions or palpable masses. Trachea-midline. Thyroid Gland Characteristics - normal size and consistency.  Eye Eyeball - Bilateral-Extraocular movements intact. Sclera/Conjunctiva - Bilateral-No scleral icterus.  Chest and Lung Exam Chest and lung exam reveals -quiet, even and easy respiratory effort with no use of accessory muscles and on auscultation, normal breath sounds, no adventitious sounds and normal vocal resonance. Inspection Chest Wall - Normal. Back - normal.  Breast Note: Breasts are moderately large but somewhat atrophic. In the lateral left breast there is a palpable mass consistent with a cancer. Probably 4 cm in diameter. This does not involve the skin and is not fixed to the chest wall. I don't feel any mass deep within the breast or medially. No other masses in either breast. No axillary adenopathy.   Cardiovascular Cardiovascular examination reveals -  normal heart sounds, regular rate and rhythm with no murmurs and normal pedal pulses bilaterally.  Abdomen Inspection Inspection of the abdomen reveals - No  Hernias. Palpation/Percussion Palpation and Percussion of the abdomen reveal - Soft, Non Tender, No Rebound tenderness, No Rigidity (guarding) and No hepatosplenomegaly. Auscultation Auscultation of the abdomen reveals - Bowel sounds normal.  Neurologic Neurologic evaluation reveals -alert and oriented x 3 with no impairment of recent or remote memory. Mental Status-Normal.  Musculoskeletal Normal Exam - Left-Upper Extremity Strength Normal and Lower Extremity Strength Normal. Normal Exam - Right-Upper Extremity Strength Normal and Lower Extremity Strength Normal.  Lymphatic Head & Neck  General Head & Neck Lymphatics: Bilateral - Description - Normal. Axillary  General Axillary Region: Bilateral - Description - Normal. Tenderness - Non Tender. Femoral & Inguinal  Generalized Femoral & Inguinal Lymphatics: Bilateral - Description - Normal. Tenderness - Non Tender.    Assessment & Plan PRIMARY CANCER OF UPPER OUTER QUADRANT OF LEFT FEMALE BREAST (C50.412)   Your recent imaging studies showed 2 masses in the left breast, one laterally and one medially. Biopsy of the medial mass is benign Biopsy of the lateral mass, which we can feel, shows breast cancer, estrogen receptor positive, HER-2 negative  By exam this is at least 2 inches in diameter You have already had a consultation with Dr. Lindi Adie in the cancer center  We had a long conversation about surgical options. We talked about lumpectomy, sentinel node biopsy, and mastectomy. We talked about the advantages and disadvantages of each approach. We decided to proceed with left breast lumpectomy with radioactive seed localization and left axillary sentinel node biopsy We will discuss your case in our breast conference. We might be able to forego the lymph node biopsy if everyone agrees. There will be a noticeable volume loss, but I think you will recover more easily from this surgery than you would from a  mastectomy You may or may not be offered radiation therapy postop  We have discussed the indications, techniques, and risk of this surgery in detail with you and your daughter.  We will schedule this surgery as soon as we can get a cardiology risk assessment completed.  CORONARY ARTERY DISEASE, OCCLUSIVE (I25.10) AT MODERATE RISK FOR FALL (Z91.81) HISTORY OF CVA (CEREBROVASCULAR ACCIDENT) (Z86.73) HYPERTENSION, ESSENTIAL (I10) TYPE 2 DIABETES MELLITUS WITH INSULIN THERAPY (E11.9) HISTORY OF HYSTERECTOMY (Z90.710) Impression: Patient states ovaries were not removed HISTORY OF BACK SURGERY (Z98.890) FAMILY HISTORY OF BREAST CANCER IN FIRST DEGREE RELATIVE (Z80.3) Impression: Daughter has history of 2 separate breast cancers    Jenilyn Magana M. Dalbert Batman, M.D., [facility] General and Minimally invasive Surgery Breast and Colorectal Surgery Office:   [phone] Pager:   [phone]

## 2017-07-08 ENCOUNTER — Ambulatory Visit (HOSPITAL_COMMUNITY)
Admission: RE | Admit: 2017-07-08 | Discharge: 2017-07-08 | Disposition: A | Discharge status: Home / Self Care | Payer: Medicare Other | Source: Ambulatory Visit | Attending: General Surgery | Admitting: General Surgery

## 2017-07-08 ENCOUNTER — Ambulatory Visit (HOSPITAL_COMMUNITY): Payer: Medicare Other | Admitting: Certified Registered"

## 2017-07-08 ENCOUNTER — Encounter (HOSPITAL_COMMUNITY): Payer: Self-pay | Admitting: *Deleted

## 2017-07-08 ENCOUNTER — Ambulatory Visit
Admission: RE | Admit: 2017-07-08 | Discharge: 2017-07-08 | Disposition: A | Discharge status: Home / Self Care | Payer: Medicare Other | Source: Ambulatory Visit | Attending: General Surgery | Admitting: General Surgery

## 2017-07-08 ENCOUNTER — Ambulatory Visit (HOSPITAL_COMMUNITY): Payer: Medicare Other | Admitting: Emergency Medicine

## 2017-07-08 ENCOUNTER — Encounter (HOSPITAL_COMMUNITY)
Admission: RE | Disposition: A | Discharge status: Home / Self Care | Payer: Self-pay | Source: Ambulatory Visit | Attending: General Surgery

## 2017-07-08 DIAGNOSIS — C50412 Malignant neoplasm of upper-outer quadrant of left female breast: Secondary | ICD-10-CM

## 2017-07-08 DIAGNOSIS — F329 Major depressive disorder, single episode, unspecified: Secondary | ICD-10-CM | POA: Insufficient documentation

## 2017-07-08 DIAGNOSIS — Z888 Allergy status to other drugs, medicaments and biological substances status: Secondary | ICD-10-CM | POA: Insufficient documentation

## 2017-07-08 DIAGNOSIS — E785 Hyperlipidemia, unspecified: Secondary | ICD-10-CM | POA: Insufficient documentation

## 2017-07-08 DIAGNOSIS — Z8673 Personal history of transient ischemic attack (TIA), and cerebral infarction without residual deficits: Secondary | ICD-10-CM | POA: Insufficient documentation

## 2017-07-08 DIAGNOSIS — Z90710 Acquired absence of both cervix and uterus: Secondary | ICD-10-CM | POA: Insufficient documentation

## 2017-07-08 DIAGNOSIS — I25118 Atherosclerotic heart disease of native coronary artery with other forms of angina pectoris: Secondary | ICD-10-CM | POA: Diagnosis not present

## 2017-07-08 DIAGNOSIS — I252 Old myocardial infarction: Secondary | ICD-10-CM | POA: Insufficient documentation

## 2017-07-08 DIAGNOSIS — E1151 Type 2 diabetes mellitus with diabetic peripheral angiopathy without gangrene: Secondary | ICD-10-CM | POA: Insufficient documentation

## 2017-07-08 DIAGNOSIS — Z170 Estrogen receptor positive status [ER+]: Principal | ICD-10-CM

## 2017-07-08 DIAGNOSIS — I10 Essential (primary) hypertension: Secondary | ICD-10-CM | POA: Insufficient documentation

## 2017-07-08 DIAGNOSIS — Z794 Long term (current) use of insulin: Secondary | ICD-10-CM | POA: Insufficient documentation

## 2017-07-08 DIAGNOSIS — G8918 Other acute postprocedural pain: Secondary | ICD-10-CM | POA: Diagnosis not present

## 2017-07-08 DIAGNOSIS — M1990 Unspecified osteoarthritis, unspecified site: Secondary | ICD-10-CM | POA: Insufficient documentation

## 2017-07-08 DIAGNOSIS — Z91041 Radiographic dye allergy status: Secondary | ICD-10-CM | POA: Insufficient documentation

## 2017-07-08 DIAGNOSIS — I2510 Atherosclerotic heart disease of native coronary artery without angina pectoris: Secondary | ICD-10-CM | POA: Insufficient documentation

## 2017-07-08 DIAGNOSIS — K219 Gastro-esophageal reflux disease without esophagitis: Secondary | ICD-10-CM | POA: Insufficient documentation

## 2017-07-08 DIAGNOSIS — Z7982 Long term (current) use of aspirin: Secondary | ICD-10-CM | POA: Insufficient documentation

## 2017-07-08 DIAGNOSIS — Z79899 Other long term (current) drug therapy: Secondary | ICD-10-CM | POA: Insufficient documentation

## 2017-07-08 DIAGNOSIS — C50912 Malignant neoplasm of unspecified site of left female breast: Secondary | ICD-10-CM | POA: Diagnosis not present

## 2017-07-08 HISTORY — PX: BREAST LUMPECTOMY WITH RADIOACTIVE SEED AND SENTINEL LYMPH NODE BIOPSY: SHX6550

## 2017-07-08 LAB — GLUCOSE, CAPILLARY
GLUCOSE-CAPILLARY: 144 mg/dL — AB (ref 65–99)
Glucose-Capillary: 163 mg/dL — ABNORMAL HIGH (ref 65–99)

## 2017-07-08 SURGERY — BREAST LUMPECTOMY WITH RADIOACTIVE SEED AND SENTINEL LYMPH NODE BIOPSY
Anesthesia: Regional | Site: Breast | Laterality: Left

## 2017-07-08 MED ORDER — BUPIVACAINE-EPINEPHRINE (PF) 0.25% -1:200000 IJ SOLN
INTRAMUSCULAR | Status: AC
  Filled 2017-07-08: qty 30

## 2017-07-08 MED ORDER — ONDANSETRON HCL 4 MG/2ML IJ SOLN
INTRAMUSCULAR | Status: DC | PRN
  Administered 2017-07-08: 4 mg via INTRAVENOUS

## 2017-07-08 MED ORDER — BUPIVACAINE-EPINEPHRINE 0.25% -1:200000 IJ SOLN
INTRAMUSCULAR | Status: DC | PRN
  Administered 2017-07-08: 30 mL

## 2017-07-08 MED ORDER — HYDROMORPHONE HCL 1 MG/ML IJ SOLN
0.2500 mg | INTRAMUSCULAR | Status: DC | PRN
  Administered 2017-07-08: 0.25 mg via INTRAVENOUS

## 2017-07-08 MED ORDER — LIDOCAINE 2% (20 MG/ML) 5 ML SYRINGE
INTRAMUSCULAR | Status: AC
  Filled 2017-07-08: qty 5

## 2017-07-08 MED ORDER — GABAPENTIN 300 MG PO CAPS
300.0000 mg | ORAL_CAPSULE | ORAL | Status: AC
  Administered 2017-07-08: 300 mg via ORAL

## 2017-07-08 MED ORDER — GABAPENTIN 300 MG PO CAPS
ORAL_CAPSULE | ORAL | Status: AC
  Filled 2017-07-08: qty 1

## 2017-07-08 MED ORDER — 0.9 % SODIUM CHLORIDE (POUR BTL) OPTIME
TOPICAL | Status: DC | PRN
  Administered 2017-07-08: 1000 mL

## 2017-07-08 MED ORDER — HYDROCODONE-ACETAMINOPHEN 7.5-325 MG PO TABS: 1.0000 | ORAL_TABLET | Freq: Once | ORAL | Status: DC | PRN

## 2017-07-08 MED ORDER — PROPOFOL 10 MG/ML IV BOLUS
INTRAVENOUS | Status: AC
  Filled 2017-07-08: qty 20

## 2017-07-08 MED ORDER — CEFAZOLIN SODIUM-DEXTROSE 2-4 GM/100ML-% IV SOLN
INTRAVENOUS | Status: AC
  Filled 2017-07-08: qty 100

## 2017-07-08 MED ORDER — CHLORHEXIDINE GLUCONATE CLOTH 2 % EX PADS: 6.0000 | MEDICATED_PAD | Freq: Once | CUTANEOUS | Status: DC

## 2017-07-08 MED ORDER — PHENYLEPHRINE HCL 10 MG/ML IJ SOLN
INTRAVENOUS | Status: DC | PRN
  Administered 2017-07-08: 40 ug/min via INTRAVENOUS

## 2017-07-08 MED ORDER — SODIUM CHLORIDE 0.9 % IJ SOLN
INTRAMUSCULAR | Status: AC
  Filled 2017-07-08: qty 10

## 2017-07-08 MED ORDER — MIDAZOLAM HCL 2 MG/2ML IJ SOLN
INTRAMUSCULAR | Status: AC
  Filled 2017-07-08: qty 2

## 2017-07-08 MED ORDER — MEPERIDINE HCL 50 MG/ML IJ SOLN: 6.2500 mg | INTRAMUSCULAR | Status: DC | PRN

## 2017-07-08 MED ORDER — LIDOCAINE 2% (20 MG/ML) 5 ML SYRINGE
INTRAMUSCULAR | Status: DC | PRN
  Administered 2017-07-08: 100 mg via INTRAVENOUS

## 2017-07-08 MED ORDER — METHYLENE BLUE 0.5 % INJ SOLN
INTRAVENOUS | Status: AC
  Filled 2017-07-08: qty 10

## 2017-07-08 MED ORDER — DEXAMETHASONE SODIUM PHOSPHATE 10 MG/ML IJ SOLN
INTRAMUSCULAR | Status: AC
  Filled 2017-07-08: qty 1

## 2017-07-08 MED ORDER — ACETAMINOPHEN 10 MG/ML IV SOLN
1000.0000 mg | Freq: Once | INTRAVENOUS | Status: DC | PRN
  Administered 2017-07-08: 1000 mg via INTRAVENOUS

## 2017-07-08 MED ORDER — CEFAZOLIN SODIUM-DEXTROSE 2-4 GM/100ML-% IV SOLN
2.0000 g | INTRAVENOUS | Status: AC
  Administered 2017-07-08: 2 g via INTRAVENOUS

## 2017-07-08 MED ORDER — ACETAMINOPHEN 10 MG/ML IV SOLN
INTRAVENOUS | Status: AC
  Administered 2017-07-08: 1000 mg via INTRAVENOUS
  Filled 2017-07-08: qty 100

## 2017-07-08 MED ORDER — ROPIVACAINE HCL 5 MG/ML IJ SOLN
INTRAMUSCULAR | Status: DC | PRN
  Administered 2017-07-08: 30 mL

## 2017-07-08 MED ORDER — TECHNETIUM TC 99M SULFUR COLLOID FILTERED
1.0000 | Freq: Once | INTRAVENOUS | Status: AC | PRN
  Administered 2017-07-08: 1 via INTRADERMAL

## 2017-07-08 MED ORDER — FENTANYL CITRATE (PF) 250 MCG/5ML IJ SOLN
INTRAMUSCULAR | Status: AC
  Filled 2017-07-08: qty 5

## 2017-07-08 MED ORDER — ACETAMINOPHEN 500 MG PO TABS
ORAL_TABLET | ORAL | Status: AC
  Filled 2017-07-08: qty 2

## 2017-07-08 MED ORDER — HYDROMORPHONE HCL 1 MG/ML IJ SOLN
INTRAMUSCULAR | Status: AC
  Administered 2017-07-08: 0.25 mg via INTRAVENOUS
  Filled 2017-07-08: qty 1

## 2017-07-08 MED ORDER — LACTATED RINGERS IV SOLN
INTRAVENOUS | Status: DC
  Administered 2017-07-08: 11:00:00 via INTRAVENOUS

## 2017-07-08 MED ORDER — ACETAMINOPHEN 500 MG PO TABS
1000.0000 mg | ORAL_TABLET | ORAL | Status: AC
  Administered 2017-07-08: 1000 mg via ORAL

## 2017-07-08 MED ORDER — PROPOFOL 10 MG/ML IV BOLUS
INTRAVENOUS | Status: DC | PRN
  Administered 2017-07-08: 100 mg via INTRAVENOUS

## 2017-07-08 MED ORDER — SODIUM CHLORIDE 0.9 % IJ SOLN
INTRAVENOUS | Status: DC | PRN
  Administered 2017-07-08: 13:00:00

## 2017-07-08 MED ORDER — DEXAMETHASONE SODIUM PHOSPHATE 10 MG/ML IJ SOLN
INTRAMUSCULAR | Status: DC | PRN
  Administered 2017-07-08: 5 mg via INTRAVENOUS

## 2017-07-08 MED ORDER — FENTANYL CITRATE (PF) 100 MCG/2ML IJ SOLN
INTRAMUSCULAR | Status: AC
  Filled 2017-07-08: qty 2

## 2017-07-08 MED ORDER — ONDANSETRON HCL 4 MG/2ML IJ SOLN
INTRAMUSCULAR | Status: AC
  Filled 2017-07-08: qty 2

## 2017-07-08 MED ORDER — FENTANYL CITRATE (PF) 100 MCG/2ML IJ SOLN
INTRAMUSCULAR | Status: DC | PRN
  Administered 2017-07-08 (×2): 50 ug via INTRAVENOUS

## 2017-07-08 MED ORDER — PROMETHAZINE HCL 25 MG/ML IJ SOLN: 6.2500 mg | INTRAMUSCULAR | Status: DC | PRN

## 2017-07-08 SURGICAL SUPPLY — 65 items
ADH SKN CLS APL DERMABOND .7 (GAUZE/BANDAGES/DRESSINGS) ×1
APPLIER CLIP 9.375 MED OPEN (MISCELLANEOUS) ×2
APR CLP MED 9.3 20 MLT OPN (MISCELLANEOUS) ×1
BINDER BREAST LRG (GAUZE/BANDAGES/DRESSINGS) IMPLANT
BINDER BREAST XLRG (GAUZE/BANDAGES/DRESSINGS) ×1 IMPLANT
BLADE SURG 15 STRL LF DISP TIS (BLADE) ×2 IMPLANT
BLADE SURG 15 STRL SS (BLADE) ×4
CANISTER SUCT 3000ML PPV (MISCELLANEOUS) ×2 IMPLANT
CHLORAPREP W/TINT 26ML (MISCELLANEOUS) ×2 IMPLANT
CLIP APPLIE 9.375 MED OPEN (MISCELLANEOUS) ×1 IMPLANT
CONT SPEC 4OZ CLIKSEAL STRL BL (MISCELLANEOUS) ×2 IMPLANT
COVER PROBE W GEL 5X96 (DRAPES) ×2 IMPLANT
COVER SURGICAL LIGHT HANDLE (MISCELLANEOUS) ×2 IMPLANT
DERMABOND ADVANCED (GAUZE/BANDAGES/DRESSINGS) ×1
DERMABOND ADVANCED .7 DNX12 (GAUZE/BANDAGES/DRESSINGS) ×1 IMPLANT
DEVICE DUBIN SPECIMEN MAMMOGRA (MISCELLANEOUS) ×2 IMPLANT
DRAPE CHEST BREAST 15X10 FENES (DRAPES) ×2 IMPLANT
DRAPE HALF SHEET 40X57 (DRAPES) ×2 IMPLANT
DRAPE UTILITY XL STRL (DRAPES) ×2 IMPLANT
DRSG PAD ABDOMINAL 8X10 ST (GAUZE/BANDAGES/DRESSINGS) ×1 IMPLANT
ELECT CAUTERY BLADE 6.4 (BLADE) ×2 IMPLANT
ELECT REM PT RETURN 9FT ADLT (ELECTROSURGICAL) ×2
ELECTRODE REM PT RTRN 9FT ADLT (ELECTROSURGICAL) ×1 IMPLANT
FILTER STRAW FLUID ASPIR (MISCELLANEOUS) IMPLANT
GAUZE SPONGE 4X4 12PLY STRL (GAUZE/BANDAGES/DRESSINGS) ×2 IMPLANT
GAUZE SPONGE 4X4 12PLY STRL LF (GAUZE/BANDAGES/DRESSINGS) ×1 IMPLANT
GLOVE BIOGEL PI IND STRL 6.5 (GLOVE) IMPLANT
GLOVE BIOGEL PI IND STRL 8 (GLOVE) IMPLANT
GLOVE BIOGEL PI INDICATOR 6.5 (GLOVE) ×1
GLOVE BIOGEL PI INDICATOR 8 (GLOVE) ×1
GLOVE EUDERMIC 7 POWDERFREE (GLOVE) ×3 IMPLANT
GLOVE SURG SS PI 6.5 STRL IVOR (GLOVE) ×1 IMPLANT
GOWN STRL REUS W/ TWL LRG LVL3 (GOWN DISPOSABLE) ×1 IMPLANT
GOWN STRL REUS W/ TWL XL LVL3 (GOWN DISPOSABLE) ×1 IMPLANT
GOWN STRL REUS W/TWL LRG LVL3 (GOWN DISPOSABLE) ×4
GOWN STRL REUS W/TWL XL LVL3 (GOWN DISPOSABLE) ×2
ILLUMINATOR WAVEGUIDE N/F (MISCELLANEOUS) IMPLANT
KIT BASIN OR (CUSTOM PROCEDURE TRAY) ×2 IMPLANT
KIT MARKER MARGIN INK (KITS) ×2 IMPLANT
LIGHT WAVEGUIDE WIDE FLAT (MISCELLANEOUS) IMPLANT
NDL 18GX1X1/2 (RX/OR ONLY) (NEEDLE) IMPLANT
NDL FILTER BLUNT 18X1 1/2 (NEEDLE) IMPLANT
NDL HYPO 25GX1X1/2 BEV (NEEDLE) ×1 IMPLANT
NDL SAFETY ECLIPSE 18X1.5 (NEEDLE) IMPLANT
NEEDLE 18GX1X1/2 (RX/OR ONLY) (NEEDLE) ×2 IMPLANT
NEEDLE 27GAX1X1/2 (NEEDLE) ×1 IMPLANT
NEEDLE FILTER BLUNT 18X 1/2SAF (NEEDLE) ×1
NEEDLE FILTER BLUNT 18X1 1/2 (NEEDLE) ×1 IMPLANT
NEEDLE HYPO 18GX1.5 SHARP (NEEDLE)
NEEDLE HYPO 25GX1X1/2 BEV (NEEDLE) ×2 IMPLANT
NS IRRIG 1000ML POUR BTL (IV SOLUTION) ×2 IMPLANT
PACK SURGICAL SETUP 50X90 (CUSTOM PROCEDURE TRAY) ×2 IMPLANT
PAD ABD 8X10 STRL (GAUZE/BANDAGES/DRESSINGS) ×1 IMPLANT
PENCIL BUTTON HOLSTER BLD 10FT (ELECTRODE) ×2 IMPLANT
SPONGE LAP 4X18 X RAY DECT (DISPOSABLE) ×2 IMPLANT
SUT MNCRL AB 4-0 PS2 18 (SUTURE) ×4 IMPLANT
SUT SILK 2 0 SH (SUTURE) ×2 IMPLANT
SUT VIC AB 2-0 CT1 36 (SUTURE) ×1 IMPLANT
SUT VIC AB 3-0 SH 18 (SUTURE) ×2 IMPLANT
SYR BULB 3OZ (MISCELLANEOUS) ×2 IMPLANT
SYR CONTROL 10ML LL (SYRINGE) ×3 IMPLANT
TOWEL OR 17X24 6PK STRL BLUE (TOWEL DISPOSABLE) ×2 IMPLANT
TOWEL OR 17X26 10 PK STRL BLUE (TOWEL DISPOSABLE) ×2 IMPLANT
TUBE CONNECTING 12X1/4 (SUCTIONS) ×2 IMPLANT
YANKAUER SUCT BULB TIP NO VENT (SUCTIONS) ×2 IMPLANT

## 2017-07-08 NOTE — Anesthesia Procedure Notes (Addendum)
Anesthesia Regional Block: Pectoralis block   Pre-Anesthetic Checklist: ,, timeout performed, Correct Patient, Correct Site, Correct Laterality, Correct Procedure, Correct Position, site marked, Risks and benefits discussed,  Surgical consent,  Pre-op evaluation,  At surgeon's request and post-op pain management  Laterality: Left  Prep: chloraprep       Needles:  Injection technique: Single-shot  Needle Type: Echogenic Needle     Needle Length: 9cm  Needle Gauge: 21     Additional Needles:   Narrative:  Start time: 07/08/2017 12:16 PM End time: 07/08/2017 12:28 PM Injection made incrementally with aspirations every 5 mL.  Performed by: Personally  Anesthesiologist: Barnet Glasgow, MD

## 2017-07-08 NOTE — Transfer of Care (Signed)
Immediate Anesthesia Transfer of Care Note  Patient: [name]  Procedure(s) Performed: LEFT BREAST LUMPECTOMY WITH RADIOACTIVE SEED AND SENTINEL LYMPH NODE BIOPSY (Left Breast)  Patient Location: PACU  Anesthesia Type:General  Level of Consciousness: awake and patient cooperative  Airway & Oxygen Therapy: Patient Spontanous Breathing  Post-op Assessment: Report given to RN and Post -op Vital signs reviewed and stable  Post vital signs: Reviewed and stable  Last Vitals:  Vitals Value Taken Time  BP 181/75 07/08/2017  2:09 PM  Temp 36.4 C 07/08/2017  2:09 PM  Pulse 80 07/08/2017  2:10 PM  Resp 9 07/08/2017  2:10 PM  SpO2 96 % 07/08/2017  2:10 PM  Vitals shown include unvalidated device data.  Last Pain:  Vitals:   07/08/17 1028  TempSrc:   PainSc: 6       Patients Stated Pain Goal: 2 (80/32/12 2482)  Complications: No apparent anesthesia complications

## 2017-07-08 NOTE — Interval H&P Note (Signed)
History and Physical Interval Note:  07/08/2017 11:42 AM  [name]  has presented today for surgery, with the diagnosis of LEFT BREAST CANCER  The various methods of treatment have been discussed with the patient and family. After consideration of risks, benefits and other options for treatment, the patient has consented to  Procedure(s): BREAST LUMPECTOMY WITH RADIOACTIVE SEED AND SENTINEL LYMPH NODE BIOPSY (Left) as a surgical intervention .  The patient's history has been reviewed, patient examined, no change in status, stable for surgery.  I have reviewed the patient's chart and labs.  Questions were answered to the patient's satisfaction.     [name]

## 2017-07-08 NOTE — Discharge Instructions (Signed)
[facility] °Office Phone Number [phone] ° °BREAST BIOPSY/ PARTIAL MASTECTOMY: POST OP INSTRUCTIONS ° °Always review your discharge instruction sheet given to you by the facility where your surgery was performed. ° °IF YOU HAVE DISABILITY OR FAMILY LEAVE FORMS, YOU MUST BRING THEM TO THE OFFICE FOR PROCESSING.  DO NOT GIVE THEM TO YOUR DOCTOR. ° °1. A prescription for pain medication may be given to you upon discharge.  Take your pain medication as prescribed, if needed.  If narcotic pain medicine is not needed, then you may take acetaminophen (Tylenol) or ibuprofen (Advil) as needed. °2. Take your usually prescribed medications unless otherwise directed °3. If you need a refill on your pain medication, please contact your pharmacy.  They will contact our office to request authorization.  Prescriptions will not be filled after 5pm or on week-ends. °4. You should eat very light the first 24 hours after surgery, such as soup, crackers, pudding, etc.  Resume your normal diet the day after surgery. °5. Most patients will experience some swelling and bruising in the breast.  Ice packs and a good support bra will help.  Swelling and bruising can take several days to resolve.  °6. It is common to experience some constipation if taking pain medication after surgery.  Increasing fluid intake and taking a stool softener will usually help or prevent this problem from occurring.  A mild laxative (Milk of Magnesia or Miralax) should be taken according to package directions if there are no bowel movements after 48 hours. °7. Unless discharge instructions indicate otherwise, you may remove your bandages 24-48 hours after surgery, and you may shower at that time.  You may have steri-strips (small skin tapes) in place directly over the incision.  These strips should be left on the skin for 7-10 days.  If your surgeon used skin glue on the incision, you may shower in 24 hours.  The glue will flake off over the  next 2-3 weeks.  Any sutures or staples will be removed at the office during your follow-up visit. °8. ACTIVITIES:  You may resume regular daily activities (gradually increasing) beginning the next day.  Wearing a good support bra or sports bra minimizes pain and swelling.  You may have sexual intercourse when it is comfortable. °a. You may drive when you no longer are taking prescription pain medication, you can comfortably wear a seatbelt, and you can safely maneuver your car and apply brakes. °b. RETURN TO WORK:  ______________________________________________________________________________________ °9. You should see your doctor in the office for a follow-up appointment approximately two weeks after your surgery.  Your doctor’s nurse will typically make your follow-up appointment when she calls you with your pathology report.  Expect your pathology report 2-3 business days after your surgery.  You may call to check if you do not hear from us after three days. °10. OTHER INSTRUCTIONS: _______________________________________________________________________________________________ _____________________________________________________________________________________________________________________________________ °_____________________________________________________________________________________________________________________________________ °_____________________________________________________________________________________________________________________________________ ° °WHEN TO CALL YOUR DOCTOR: °1. Fever over 101.0 °2. Nausea and/or vomiting. °3. Extreme swelling or bruising. °4. Continued bleeding from incision. °5. Increased pain, redness, or drainage from the incision. ° °The clinic staff is available to answer your questions during regular business hours.  Please don’t hesitate to call and ask to speak to one of the nurses for clinical concerns.  If you have a medical emergency, go to the nearest  emergency room or call 911.  A surgeon from [facility] is always on call at the hospital. ° °For further questions, please visit [email or web address]  °

## 2017-07-08 NOTE — Anesthesia Preprocedure Evaluation (Addendum)
Anesthesia Evaluation  Patient identified by MRN, date of birth, ID band Patient awake    Reviewed: Allergy & Precautions, NPO status , Patient's Chart, lab work & pertinent test results  Airway Mallampati: II  TM Distance: >3 FB Neck ROM: Full    Dental no notable dental hx.    Pulmonary neg pulmonary ROS,    Pulmonary exam normal breath sounds clear to auscultation       Cardiovascular Exercise Tolerance: Good hypertension, + CAD and + Peripheral Vascular Disease  Normal cardiovascular exam Rhythm:Regular Rate:Normal     Neuro/Psych PSYCHIATRIC DISORDERS CVA negative neurological ROS     GI/Hepatic negative GI ROS, Neg liver ROS,   Endo/Other  diabetes, Well Controlled, Type 1, Insulin Dependent  Renal/GU negative Renal ROS     Musculoskeletal   Abdominal   Peds  Hematology negative hematology ROS (+) anemia ,   Anesthesia Other Findings   Reproductive/Obstetrics                            Anesthesia Physical Anesthesia Plan  ASA: III  Anesthesia Plan: Regional and General   Post-op Pain Management:    Induction:   PONV Risk Score and Plan: Treatment may vary due to age or medical condition  Airway Management Planned: LMA  Additional Equipment:   Intra-op Plan:   Post-operative Plan:   Informed Consent: I have reviewed the patients History and Physical, chart, labs and discussed the procedure including the risks, benefits and alternatives for the proposed anesthesia with the patient or authorized representative who has indicated his/her understanding and acceptance.     Plan Discussed with:   Anesthesia Plan Comments:         Anesthesia Quick Evaluation

## 2017-07-08 NOTE — Op Note (Signed)
Patient Name:           [name]   Date of Surgery:        07/08/2017  Pre op Diagnosis:      Cancer left breast, upper outer quadrant  Post op Diagnosis:    Same  Procedure:                 Inject blue dye left breast                                      Left breast lumpectomy with radioactive seed localization                                     Left axillary deep sentinel lymph node biopsy   Surgeon:                     Edsel Petrin. Dalbert Batman, M.D., FACS  Assistant:                      OR staff  Operative Indications:    This is a very pleasant 84 year old woman, referred by Dr. Shelly Bombard at the [facility] for evaluation and management of a left breast cancer. She has already seen Dr. Lindi Adie. Dr. Reynaldo Minium is her PCP. Dr. Lia Foyer used to be her cardiologist         She has no prior history of breast problems. She felt a lump in her left breast laterally about 4 months ago. She had mammograms and ultrasound on May 16, 2017. Category C density. There is a 3.7 cm mass in the left breast at the 2 o'clock position. This is palpable and is at least 3.5 cm diameter but quite mobile and not invading skin or muscle.. Biopsy shows invasive ductal carcinoma grade 2. ER 100%. PR 70%. HER-2 negative. Ki-67 6. There was an additional mass in the left breast, far posterior, medial 11 o'clock position, 9 cm from the nipple, 3.2 cm. This was biopsied and showed benign breast parenchyma. Radiology felt this was concordant.      Ultrasound the axilla is negative.  Examination of the axilla is negative.      Past history reveals that she had a stroke many years ago. She has coronary artery disease and the daughter reports that she was having a cardiac cath for a stent placement for a possible mild myocardial infarction and she had a stroke and never had the stent placed. She is not actively followed by cardiology. She doesn't have chest pain or shortness of breath but is not very active. She has  hypertension. Hyperlipidemia. Insulin-dependent diabetes. TAH without BSO. Appendectomy. Back surgery. She has had some falls most recently 2 months ago without major in injury. She walks with a scooter at home and is in a wheelchair when she goes out. Family history reveals that her daughter, who is with her, has had surgery and treatment for 2 separate breast cancers.  As well.  Wound was irrigated with saline.  Hemostasis was confirmed.  Suture.      We had a very long talk about surgical approaches and comprehensive care of her cancer. She will not receive chemotherapy but will be offered antiestrogen therapy. It is not clear whether she will be offered radiation therapy regardless of what  I do surgically. We talked about mastectomy, lumpectomy, sentinel node biopsy. We talked about the advantages and disadvantages of each approach. The patient, her daughter, and I both favor a generous lumpectomy of the lateral left breast and maybe sentinel node biopsy, maybe not. I think she will recover better from this surgery then she would with a mastectomy with tubes and drains.  Decisions about radiation therapy will be made later.      She takes hydrocodone regularly at home because of her arthritis.  No further prescription shall be written    Operative Findings:       The tumor was in the lateral left breast.  In an attempt to get a good margin I made a radial ellipse incision with the widest point of the ellipse being about 2-1/2 cm.  The posterior margin of the lumpectomy is the muscle.  The superior margin is the axilla.  The specimen mammogram looked good with the radioactive seed, original biopsy clip, and the tumor within the center of the specimen.  It looks like I had good margins.  I was able to do the sentinel lymph node exploration and biopsy through the lumpectomy incision.  I found a single sentinel lymph node.  Procedure in Detail:          The patient underwent pectoral block  and injection of technetium radionuclide in the holding area.  She was brought to the operating room and underwent general anesthesia with an LMA device.  Surgical timeout was performed.  Intravenous antibiotics were given.  Following alcohol prep I injected 5 cc of dilute methylene blue into the left breast, subareolar area and massaged the breast for a few minutes.  The entire left chest wall was then prepped and draped in a sterile fashion.  0.5% Marcaine with epinephrine was used as local infiltration anesthetic.     I made a generous, transverse, radial elliptical incision at the 3 o'clock position.  The neoprobe and palpation guided my excision.  This was a generous lumpectomy, almost a quadrantectomy.  Dissection was carried all the way down to the muscle.  The medial posterior margin is the pectoralis muscle.  The lateral posterior margin is the serratus muscle.  The superior margin is the axilla.     The specimen was removed and marked with silk sutures and a 6 color ink kit to guide the pathologist.  Specimen mammogram looked very good as described above.  The specimen was sent to the pathology lab.  The wound was irrigated.  Hemostasis was excellent.   I found a solitary sentinel lymph node that was blue and hot and sent that to the lab.       I spent a long time looking in the axilla and I found no other radioactivity or blue dye.  There was no palpable abnormality in the left axilla.  Therefore only one lymph node was sent      I placed 5 or 6 metal marker clips to mark the walls of the lumpectomy cavity.  The lumpectomy cavity was carefully closed in multiple layers with 2-0 Vicryl and 3-0 Vicryl and the skin closed with a running subcuticular 4-0 Monocryl and Dermabond.  Clean bandages and a breast binder were placed.  The patient tolerated the procedure well and was taken to PACU in stable condition.  EBL 20 cc or less.  Counts correct.  Complications none.   Addendum: I logged on to the [facility]  [facility] website and reviewed her prescription  medication history    Marshun Duva M. Dalbert Batman, M.D., FACS General and Minimally Invasive Surgery Breast and Colorectal Surgery  07/08/2017 2:03 PM

## 2017-07-08 NOTE — Anesthesia Procedure Notes (Signed)
Procedure Name: LMA Insertion Date/Time: 07/08/2017 12:52 PM Performed by: Gwyndolyn Saxon, CRNA Pre-anesthesia Checklist: Patient identified, Emergency Drugs available, Suction available, Patient being monitored and Timeout performed Patient Re-evaluated:Patient Re-evaluated prior to induction Oxygen Delivery Method: Circle system utilized Preoxygenation: Pre-oxygenation with 100% oxygen Induction Type: IV induction Ventilation: Mask ventilation without difficulty LMA: LMA inserted LMA Size: 4.0 Number of attempts: 1 Placement Confirmation: positive ETCO2,  CO2 detector and breath sounds checked- equal and bilateral Tube secured with: Tape Dental Injury: Teeth and Oropharynx as per pre-operative assessment

## 2017-07-10 ENCOUNTER — Encounter (HOSPITAL_COMMUNITY): Payer: Self-pay | Admitting: General Surgery

## 2017-07-10 NOTE — Anesthesia Postprocedure Evaluation (Signed)
Anesthesia Post Note  Patient: [name]  Procedure(s) Performed: LEFT BREAST LUMPECTOMY WITH RADIOACTIVE SEED AND SENTINEL LYMPH NODE BIOPSY (Left Breast)     Patient location during evaluation: PACU Anesthesia Type: General Level of consciousness: awake and alert Pain management: pain level controlled Vital Signs Assessment: post-procedure vital signs reviewed and stable Respiratory status: spontaneous breathing, nonlabored ventilation, respiratory function stable and patient connected to nasal cannula oxygen Cardiovascular status: blood pressure returned to baseline and stable Postop Assessment: no apparent nausea or vomiting Anesthetic complications: no    Last Vitals:  Vitals:   07/08/17 1515 07/08/17 1530  BP: (!) 185/79 (!) 180/78  Pulse: 73 76  Resp: 14 15  Temp:  36.4 C  SpO2: 98% 94%    Last Pain:  Vitals:   07/08/17 1530  TempSrc:   PainSc: 3                  Jaquail Mclees EDWARD

## 2017-07-14 NOTE — Progress Notes (Signed)
Inform patient of Pathology report,. Breast pathology shows that the breast cancer was 3.4 cm. Size, as expected. The margins are clear. The lymph node is negative for cancer. All in all this is good news. She will not need any further surgery. I will discuss with her in detail at the next office visit. She needs to see Dr. Lindi Adie soon.  She also needs to see a radiation oncologist.  Let me know that you reached her.  Thanks. Dalbert Batman

## 2017-07-16 ENCOUNTER — Ambulatory Visit: Payer: Medicare Other | Admitting: Hematology and Oncology

## 2017-08-02 ENCOUNTER — Telehealth: Payer: Self-pay | Admitting: Hematology and Oncology

## 2017-08-02 ENCOUNTER — Inpatient Hospital Stay: Payer: Medicare Other | Attending: Hematology and Oncology | Admitting: Hematology and Oncology

## 2017-08-02 DIAGNOSIS — Z79899 Other long term (current) drug therapy: Secondary | ICD-10-CM | POA: Insufficient documentation

## 2017-08-02 DIAGNOSIS — E1151 Type 2 diabetes mellitus with diabetic peripheral angiopathy without gangrene: Secondary | ICD-10-CM | POA: Diagnosis not present

## 2017-08-02 DIAGNOSIS — Z170 Estrogen receptor positive status [ER+]: Secondary | ICD-10-CM

## 2017-08-02 DIAGNOSIS — C50412 Malignant neoplasm of upper-outer quadrant of left female breast: Secondary | ICD-10-CM | POA: Insufficient documentation

## 2017-08-02 DIAGNOSIS — Z794 Long term (current) use of insulin: Secondary | ICD-10-CM | POA: Diagnosis not present

## 2017-08-02 DIAGNOSIS — Z7982 Long term (current) use of aspirin: Secondary | ICD-10-CM | POA: Diagnosis not present

## 2017-08-02 MED ORDER — TAMOXIFEN CITRATE 20 MG PO TABS: 20.0000 mg | ORAL_TABLET | Freq: Every day | ORAL | 3 refills | Status: DC

## 2017-08-02 NOTE — Assessment & Plan Note (Signed)
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 I discussed the margins as well as lymph node surgeries. We also discussed the final staging along with previously performed ER/PR and HER-2/neu testing.  Recommendation: Discuss in tumor board regarding pros and cons of radiation 1. Antiestrogen therapy with letrozole 2.5 mg daily  Return to clinic in 3 months for survivorship care plan visit

## 2017-08-02 NOTE — Progress Notes (Signed)
Patient Care Team: Burnard Bunting, MD as PCP - General (Internal Medicine)  DIAGNOSIS:  Encounter Diagnosis  Name Primary?  . Malignant neoplasm of upper-outer quadrant of left breast in female, estrogen receptor positive ([place])     SUMMARY OF ONCOLOGIC HISTORY:   Malignant neoplasm of upper-outer quadrant of left breast in female, estrogen receptor positive ([place])   05/16/2017 Initial Diagnosis    Patient palpated a nodule in left breast mass at 2 o'clock position: 3.7 x 3.2 x 2.3 cm, additional circumscribed mass 3.2 cm; left breast biopsy at 11 o'clock position: Benign, left breast mass at 2 o'clock position: IDC with DCIS grade 2, ER 100%, PR 70%, Ki-67 10%, HER-2 negative ratio 1.38, T2 N0 stage Ib      07/08/2017 Surgery    Left lumpectomy: Mucinous carcinoma Nottingham grade 2, 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  She is recovering very well from recent surgery.  Denies any major pain or discomfort.  REVIEW OF SYSTEMS:   Constitutional: Denies fevers, chills or abnormal weight loss Eyes: Denies blurriness of vision Ears, nose, mouth, throat, and face: Denies mucositis or sore throat Respiratory: Denies cough, dyspnea or wheezes Cardiovascular: Denies palpitation, chest discomfort Gastrointestinal:  Denies nausea, heartburn or change in bowel habits Skin: Denies abnormal skin rashes Lymphatics: Denies new lymphadenopathy or easy  bruising Neurological:Denies numbness, tingling or new weaknesses Behavioral/Psych: Mood is stable, no new changes  Extremities: No lower extremity edema Breast: Recent left lumpectomy All other systems were reviewed with the patient and are negative.  I have reviewed the past medical history, past surgical history, social history and family history with the patient and they are unchanged from previous note.  ALLERGIES:  is allergic to cephalexin and ivp dye [iodinated diagnostic agents].  MEDICATIONS:  Current Outpatient Medications  Medication Sig Dispense Refill  . aspirin EC 81 MG tablet Take 81 mg by mouth daily.    Marland Kitchen escitalopram (LEXAPRO) 10 MG tablet Take 10 mg by mouth daily.     Marland Kitchen HYDROcodone-acetaminophen (NORCO) 10-325 MG per tablet Take 1 tablet by mouth every 4 (four) hours as needed. For pain.    Marland Kitchen insulin aspart (NOVOLOG) 100 UNIT/ML injection Inject into the skin 3 (three) times daily before meals. Sliding scale    . insulin detemir (LEVEMIR) 100 UNIT/ML injection Inject 6 Units into the skin 2 (two) times daily.    Marland Kitchen omeprazole (PRILOSEC) 20 MG capsule Take 20 mg by mouth daily.     . simvastatin (ZOCOR) 40 MG tablet Take 1 tablet (40 mg total) by mouth at bedtime. 90 tablet 3  . tamoxifen (NOLVADEX) 20 MG tablet Take 1 tablet (20 mg total) by mouth daily. 90 tablet 3  . telmisartan (MICARDIS) 20 MG tablet Take 1 tablet (20 mg total) by mouth daily. 30 tablet 11  . temazepam (RESTORIL) 15 MG capsule Take 15 mg by mouth at bedtime as needed. For sleep.  No current facility-administered medications for this visit.     PHYSICAL EXAMINATION: ECOG PERFORMANCE STATUS: 2 - Symptomatic, <50% confined to bed  Vitals:   08/02/17 1142  BP: (!) 114/46  Pulse: 75  Resp: 17  Temp: 98.8 F (37.1 C)  SpO2: 97%   Filed Weights   08/02/17 1142  Weight: 152 lb 12.8 oz (69.3 kg)    GENERAL:alert, no distress and comfortable SKIN: skin color, texture, turgor are normal,  no rashes or significant lesions EYES: normal, Conjunctiva are pink and non-injected, sclera clear OROPHARYNX:no exudate, no erythema and lips, buccal mucosa, and tongue normal  NECK: supple, thyroid normal size, non-tender, without nodularity LYMPH:  no palpable lymphadenopathy in the cervical, axillary or inguinal LUNGS: clear to auscultation and percussion with normal breathing effort HEART: regular rate & rhythm and no murmurs and no lower extremity edema ABDOMEN:abdomen soft, non-tender and normal bowel sounds MUSCULOSKELETAL:no cyanosis of digits and no clubbing  NEURO: alert & oriented x 3 with fluent speech, no focal motor/sensory deficits EXTREMITIES: No lower extremity edema  LABORATORY DATA:  I have reviewed the data as listed CMP Latest Ref Rng & Units 07/04/2017 11/25/2012 11/25/2012  Glucose 65 - 99 mg/dL 101(H) 190(H) 160(H)  BUN 6 - 20 mg/dL 13 27(H) 34(H)  Creatinine 0.44 - 1.00 mg/dL 0.91 1.04 1.10  Sodium 135 - 145 mmol/L 139 136 138  Potassium 3.5 - 5.1 mmol/L 4.0 5.5(H) 5.7(H)  Chloride 101 - 111 mmol/L 106 103 105  CO2 22 - 32 mmol/L 22 25 -  Calcium 8.9 - 10.3 mg/dL 8.7(L) 9.1 -  Total Protein 6.5 - 8.1 g/dL 6.8 - -  Total Bilirubin 0.3 - 1.2 mg/dL 0.5 - -  Alkaline Phos 38 - 126 U/L 128(H) - -  AST 15 - 41 U/L 16 - -  ALT 14 - 54 U/L 7(L) - -    Lab Results  Component Value Date   WBC 5.8 07/04/2017   HGB 10.3 (L) 07/04/2017   HCT 32.9 (L) 07/04/2017   MCV 95.6 07/04/2017   PLT 409 (H) 07/04/2017   NEUTROABS 3.1 07/04/2017    ASSESSMENT & PLAN:  Malignant neoplasm of upper-outer quadrant of left breast in female, estrogen receptor positive ([place]) 07/08/2017: Left lumpectomy: Mucinous carcinoma Nottingham grade 2, 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 I discussed the margins as well as lymph node surgeries. We also discussed the final staging along with previously performed ER/PR and HER-2/neu testing.  Recommendation: Discuss in tumor board regarding pros and cons of radiation Antiestrogen therapy with tamoxifen starting at 10 mg daily and if she tolerates it well she can increase it to 20 mg daily. I discussed with the patient's family that quality of life is very important.  If the tamoxifen is causing any significant side effects that I encouraged him to discontinue it.  Return to clinic in 3 months for survivorship care plan visit   No orders of the defined types were placed in this encounter.  The patient has a good understanding of the overall plan. she agrees with it. she will call with any problems that may develop before the next visit here.   Harriette Ohara, MD 08/02/17

## 2017-08-02 NOTE — Telephone Encounter (Signed)
Gave patient AVs and calendar of upcoming August appointments.

## 2017-08-05 ENCOUNTER — Encounter: Payer: Self-pay | Admitting: *Deleted

## 2017-08-19 ENCOUNTER — Encounter (HOSPITAL_COMMUNITY): Payer: Self-pay | Admitting: *Deleted

## 2017-08-19 ENCOUNTER — Other Ambulatory Visit: Payer: Self-pay

## 2017-08-19 ENCOUNTER — Inpatient Hospital Stay (HOSPITAL_COMMUNITY)
Admission: EM | Admit: 2017-08-19 | Discharge: 2017-08-27 | DRG: 291 | Disposition: A | Discharge status: Skilled Nursing Facility | Payer: Medicare Other | Attending: Internal Medicine | Admitting: Internal Medicine

## 2017-08-19 ENCOUNTER — Emergency Department (HOSPITAL_COMMUNITY): Payer: Medicare Other

## 2017-08-19 DIAGNOSIS — N179 Acute kidney failure, unspecified: Secondary | ICD-10-CM | POA: Diagnosis not present

## 2017-08-19 DIAGNOSIS — K5900 Constipation, unspecified: Secondary | ICD-10-CM | POA: Diagnosis not present

## 2017-08-19 DIAGNOSIS — I10 Essential (primary) hypertension: Secondary | ICD-10-CM | POA: Diagnosis present

## 2017-08-19 DIAGNOSIS — I25119 Atherosclerotic heart disease of native coronary artery with unspecified angina pectoris: Secondary | ICD-10-CM | POA: Diagnosis present

## 2017-08-19 DIAGNOSIS — I5023 Acute on chronic systolic (congestive) heart failure: Secondary | ICD-10-CM | POA: Diagnosis present

## 2017-08-19 DIAGNOSIS — E1165 Type 2 diabetes mellitus with hyperglycemia: Secondary | ICD-10-CM | POA: Diagnosis present

## 2017-08-19 DIAGNOSIS — R1084 Generalized abdominal pain: Secondary | ICD-10-CM | POA: Diagnosis not present

## 2017-08-19 DIAGNOSIS — Z7982 Long term (current) use of aspirin: Secondary | ICD-10-CM

## 2017-08-19 DIAGNOSIS — I272 Pulmonary hypertension, unspecified: Secondary | ICD-10-CM | POA: Diagnosis present

## 2017-08-19 DIAGNOSIS — I110 Hypertensive heart disease with heart failure: Secondary | ICD-10-CM | POA: Diagnosis not present

## 2017-08-19 DIAGNOSIS — R112 Nausea with vomiting, unspecified: Secondary | ICD-10-CM | POA: Diagnosis not present

## 2017-08-19 DIAGNOSIS — E785 Hyperlipidemia, unspecified: Secondary | ICD-10-CM | POA: Diagnosis present

## 2017-08-19 DIAGNOSIS — I5021 Acute systolic (congestive) heart failure: Secondary | ICD-10-CM | POA: Diagnosis not present

## 2017-08-19 DIAGNOSIS — C50412 Malignant neoplasm of upper-outer quadrant of left female breast: Secondary | ICD-10-CM | POA: Diagnosis present

## 2017-08-19 DIAGNOSIS — E11649 Type 2 diabetes mellitus with hypoglycemia without coma: Secondary | ICD-10-CM | POA: Diagnosis not present

## 2017-08-19 DIAGNOSIS — R0989 Other specified symptoms and signs involving the circulatory and respiratory systems: Secondary | ICD-10-CM | POA: Diagnosis not present

## 2017-08-19 DIAGNOSIS — Z803 Family history of malignant neoplasm of breast: Secondary | ICD-10-CM

## 2017-08-19 DIAGNOSIS — R410 Disorientation, unspecified: Secondary | ICD-10-CM | POA: Diagnosis present

## 2017-08-19 DIAGNOSIS — I361 Nonrheumatic tricuspid (valve) insufficiency: Secondary | ICD-10-CM | POA: Diagnosis not present

## 2017-08-19 DIAGNOSIS — M2550 Pain in unspecified joint: Secondary | ICD-10-CM | POA: Diagnosis not present

## 2017-08-19 DIAGNOSIS — Z170 Estrogen receptor positive status [ER+]: Secondary | ICD-10-CM

## 2017-08-19 DIAGNOSIS — Z794 Long term (current) use of insulin: Secondary | ICD-10-CM | POA: Diagnosis not present

## 2017-08-19 DIAGNOSIS — I481 Persistent atrial fibrillation: Secondary | ICD-10-CM | POA: Diagnosis not present

## 2017-08-19 DIAGNOSIS — I2510 Atherosclerotic heart disease of native coronary artery without angina pectoris: Secondary | ICD-10-CM | POA: Diagnosis not present

## 2017-08-19 DIAGNOSIS — R5381 Other malaise: Secondary | ICD-10-CM | POA: Diagnosis present

## 2017-08-19 DIAGNOSIS — Z79810 Long term (current) use of selective estrogen receptor modulators (SERMs): Secondary | ICD-10-CM

## 2017-08-19 DIAGNOSIS — Z7401 Bed confinement status: Secondary | ICD-10-CM | POA: Diagnosis not present

## 2017-08-19 DIAGNOSIS — F329 Major depressive disorder, single episode, unspecified: Secondary | ICD-10-CM | POA: Diagnosis present

## 2017-08-19 DIAGNOSIS — Z66 Do not resuscitate: Secondary | ICD-10-CM | POA: Diagnosis present

## 2017-08-19 DIAGNOSIS — K219 Gastro-esophageal reflux disease without esophagitis: Secondary | ICD-10-CM | POA: Diagnosis present

## 2017-08-19 DIAGNOSIS — I5043 Acute on chronic combined systolic (congestive) and diastolic (congestive) heart failure: Secondary | ICD-10-CM | POA: Diagnosis not present

## 2017-08-19 DIAGNOSIS — I959 Hypotension, unspecified: Secondary | ICD-10-CM | POA: Diagnosis not present

## 2017-08-19 DIAGNOSIS — R748 Abnormal levels of other serum enzymes: Secondary | ICD-10-CM | POA: Diagnosis not present

## 2017-08-19 DIAGNOSIS — F419 Anxiety disorder, unspecified: Secondary | ICD-10-CM | POA: Diagnosis present

## 2017-08-19 DIAGNOSIS — D630 Anemia in neoplastic disease: Secondary | ICD-10-CM | POA: Diagnosis present

## 2017-08-19 DIAGNOSIS — I5041 Acute combined systolic (congestive) and diastolic (congestive) heart failure: Secondary | ICD-10-CM | POA: Diagnosis not present

## 2017-08-19 DIAGNOSIS — R079 Chest pain, unspecified: Secondary | ICD-10-CM

## 2017-08-19 DIAGNOSIS — Z8673 Personal history of transient ischemic attack (TIA), and cerebral infarction without residual deficits: Secondary | ICD-10-CM

## 2017-08-19 DIAGNOSIS — Z85828 Personal history of other malignant neoplasm of skin: Secondary | ICD-10-CM

## 2017-08-19 DIAGNOSIS — R1111 Vomiting without nausea: Secondary | ICD-10-CM | POA: Diagnosis not present

## 2017-08-19 DIAGNOSIS — I6350 Cerebral infarction due to unspecified occlusion or stenosis of unspecified cerebral artery: Secondary | ICD-10-CM | POA: Diagnosis not present

## 2017-08-19 DIAGNOSIS — I509 Heart failure, unspecified: Secondary | ICD-10-CM | POA: Diagnosis not present

## 2017-08-19 DIAGNOSIS — A084 Viral intestinal infection, unspecified: Secondary | ICD-10-CM | POA: Diagnosis not present

## 2017-08-19 DIAGNOSIS — I252 Old myocardial infarction: Secondary | ICD-10-CM | POA: Diagnosis not present

## 2017-08-19 DIAGNOSIS — I4891 Unspecified atrial fibrillation: Secondary | ICD-10-CM | POA: Diagnosis not present

## 2017-08-19 DIAGNOSIS — R197 Diarrhea, unspecified: Secondary | ICD-10-CM

## 2017-08-19 DIAGNOSIS — I5032 Chronic diastolic (congestive) heart failure: Secondary | ICD-10-CM

## 2017-08-19 DIAGNOSIS — J9601 Acute respiratory failure with hypoxia: Secondary | ICD-10-CM | POA: Diagnosis not present

## 2017-08-19 DIAGNOSIS — E119 Type 2 diabetes mellitus without complications: Secondary | ICD-10-CM

## 2017-08-19 DIAGNOSIS — Z79899 Other long term (current) drug therapy: Secondary | ICD-10-CM

## 2017-08-19 DIAGNOSIS — Z91041 Radiographic dye allergy status: Secondary | ICD-10-CM

## 2017-08-19 DIAGNOSIS — Z853 Personal history of malignant neoplasm of breast: Secondary | ICD-10-CM

## 2017-08-19 DIAGNOSIS — R0602 Shortness of breath: Secondary | ICD-10-CM | POA: Diagnosis not present

## 2017-08-19 DIAGNOSIS — I480 Paroxysmal atrial fibrillation: Secondary | ICD-10-CM

## 2017-08-19 DIAGNOSIS — Z881 Allergy status to other antibiotic agents status: Secondary | ICD-10-CM

## 2017-08-19 LAB — URINALYSIS, ROUTINE W REFLEX MICROSCOPIC
Bilirubin Urine: NEGATIVE
Glucose, UA: NEGATIVE mg/dL
KETONES UR: 5 mg/dL — AB
NITRITE: NEGATIVE
PROTEIN: NEGATIVE mg/dL
Specific Gravity, Urine: 1.017 (ref 1.005–1.030)
pH: 5 (ref 5.0–8.0)

## 2017-08-19 LAB — CBC
HCT: 29.5 % — ABNORMAL LOW (ref 36.0–46.0)
HEMOGLOBIN: 9.2 g/dL — AB (ref 12.0–15.0)
MCH: 29.4 pg (ref 26.0–34.0)
MCHC: 31.2 g/dL (ref 30.0–36.0)
MCV: 94.2 fL (ref 78.0–100.0)
PLATELETS: 394 10*3/uL (ref 150–400)
RBC: 3.13 MIL/uL — ABNORMAL LOW (ref 3.87–5.11)
RDW: 14.2 % (ref 11.5–15.5)
WBC: 14.7 10*3/uL — ABNORMAL HIGH (ref 4.0–10.5)

## 2017-08-19 LAB — COMPREHENSIVE METABOLIC PANEL
ALBUMIN: 3.8 g/dL (ref 3.5–5.0)
ALT: 12 U/L — AB (ref 14–54)
ANION GAP: 9 (ref 5–15)
AST: 26 U/L (ref 15–41)
Alkaline Phosphatase: 141 U/L — ABNORMAL HIGH (ref 38–126)
BILIRUBIN TOTAL: 0.6 mg/dL (ref 0.3–1.2)
BUN: 16 mg/dL (ref 6–20)
CALCIUM: 8.8 mg/dL — AB (ref 8.9–10.3)
CO2: 21 mmol/L — AB (ref 22–32)
Chloride: 109 mmol/L (ref 101–111)
Creatinine, Ser: 1.12 mg/dL — ABNORMAL HIGH (ref 0.44–1.00)
GFR calc Af Amer: 52 mL/min — ABNORMAL LOW (ref >60)
GFR calc non Af Amer: 44 mL/min — ABNORMAL LOW (ref >60)
GLUCOSE: 257 mg/dL — AB (ref 65–99)
Potassium: 4.9 mmol/L (ref 3.5–5.1)
Sodium: 139 mmol/L (ref 135–145)
TOTAL PROTEIN: 7 g/dL (ref 6.5–8.1)

## 2017-08-19 LAB — GLUCOSE, CAPILLARY: Glucose-Capillary: 265 mg/dL — ABNORMAL HIGH (ref 65–99)

## 2017-08-19 LAB — TROPONIN I
Troponin I: 0.05 ng/mL (ref <0.03)
Troponin I: 0.08 ng/mL (ref <0.03)

## 2017-08-19 LAB — LIPASE, BLOOD: Lipase: 17 U/L (ref 11–51)

## 2017-08-19 LAB — TSH: TSH: 1.988 u[IU]/mL (ref 0.350–4.500)

## 2017-08-19 LAB — CBG MONITORING, ED: Glucose-Capillary: 237 mg/dL — ABNORMAL HIGH (ref 65–99)

## 2017-08-19 LAB — BRAIN NATRIURETIC PEPTIDE: B Natriuretic Peptide: 409.6 pg/mL — ABNORMAL HIGH (ref 0.0–100.0)

## 2017-08-19 MED ORDER — FUROSEMIDE 10 MG/ML IJ SOLN
40.0000 mg | Freq: Two times a day (BID) | INTRAMUSCULAR | Status: DC
  Administered 2017-08-19 – 2017-08-21 (×4): 40 mg via INTRAVENOUS
  Filled 2017-08-19 (×4): qty 4

## 2017-08-19 MED ORDER — SIMVASTATIN 40 MG PO TABS
40.0000 mg | ORAL_TABLET | Freq: Every day | ORAL | Status: DC
  Administered 2017-08-19 – 2017-08-24 (×6): 40 mg via ORAL
  Filled 2017-08-19 (×6): qty 1

## 2017-08-19 MED ORDER — INSULIN DETEMIR 100 UNIT/ML ~~LOC~~ SOLN
6.0000 [IU] | Freq: Two times a day (BID) | SUBCUTANEOUS | Status: DC
  Administered 2017-08-19 – 2017-08-22 (×7): 6 [IU] via SUBCUTANEOUS
  Filled 2017-08-19 (×8): qty 0.06

## 2017-08-19 MED ORDER — ENOXAPARIN SODIUM 40 MG/0.4ML ~~LOC~~ SOLN
40.0000 mg | SUBCUTANEOUS | Status: DC
  Administered 2017-08-19: 40 mg via SUBCUTANEOUS
  Filled 2017-08-19: qty 0.4

## 2017-08-19 MED ORDER — ESCITALOPRAM OXALATE 20 MG PO TABS
10.0000 mg | ORAL_TABLET | Freq: Every day | ORAL | Status: DC
  Administered 2017-08-19 – 2017-08-27 (×9): 10 mg via ORAL
  Filled 2017-08-19 (×9): qty 1

## 2017-08-19 MED ORDER — TEMAZEPAM 15 MG PO CAPS
15.0000 mg | ORAL_CAPSULE | Freq: Every evening | ORAL | Status: DC | PRN
  Administered 2017-08-22 – 2017-08-26 (×3): 15 mg via ORAL
  Filled 2017-08-19 (×3): qty 1

## 2017-08-19 MED ORDER — HYDRALAZINE HCL 20 MG/ML IJ SOLN
5.0000 mg | INTRAMUSCULAR | Status: DC | PRN
  Administered 2017-08-20 – 2017-08-27 (×2): 5 mg via INTRAVENOUS
  Filled 2017-08-19 (×2): qty 1

## 2017-08-19 MED ORDER — PANTOPRAZOLE SODIUM 40 MG PO TBEC
40.0000 mg | DELAYED_RELEASE_TABLET | Freq: Every day | ORAL | Status: DC
  Administered 2017-08-19 – 2017-08-27 (×9): 40 mg via ORAL
  Filled 2017-08-19 (×9): qty 1

## 2017-08-19 MED ORDER — SODIUM CHLORIDE 0.9 % IV SOLN
1000.0000 mL | INTRAVENOUS | Status: DC
  Administered 2017-08-19: 1000 mL via INTRAVENOUS

## 2017-08-19 MED ORDER — INSULIN ASPART 100 UNIT/ML ~~LOC~~ SOLN
0.0000 [IU] | Freq: Three times a day (TID) | SUBCUTANEOUS | Status: DC
  Administered 2017-08-20: 1 [IU] via SUBCUTANEOUS
  Administered 2017-08-20: 3 [IU] via SUBCUTANEOUS
  Administered 2017-08-21: 2 [IU] via SUBCUTANEOUS
  Administered 2017-08-21: 1 [IU] via SUBCUTANEOUS
  Administered 2017-08-22 – 2017-08-23 (×3): 5 [IU] via SUBCUTANEOUS

## 2017-08-19 MED ORDER — ACETAMINOPHEN 650 MG RE SUPP: 650.0000 mg | Freq: Four times a day (QID) | RECTAL | Status: DC | PRN

## 2017-08-19 MED ORDER — PROMETHAZINE HCL 25 MG/ML IJ SOLN
12.5000 mg | Freq: Once | INTRAMUSCULAR | Status: AC
  Administered 2017-08-19: 12.5 mg via INTRAVENOUS
  Filled 2017-08-19: qty 1

## 2017-08-19 MED ORDER — INSULIN ASPART 100 UNIT/ML ~~LOC~~ SOLN
3.0000 [IU] | Freq: Three times a day (TID) | SUBCUTANEOUS | Status: DC
  Administered 2017-08-19 – 2017-08-23 (×7): 3 [IU] via SUBCUTANEOUS

## 2017-08-19 MED ORDER — FUROSEMIDE 10 MG/ML IJ SOLN
60.0000 mg | Freq: Once | INTRAMUSCULAR | Status: AC
Start: 2017-08-19 — End: 2017-08-19
  Administered 2017-08-19: 60 mg via INTRAVENOUS
  Filled 2017-08-19: qty 8

## 2017-08-19 MED ORDER — SODIUM CHLORIDE 0.9 % IV BOLUS (SEPSIS)
500.0000 mL | Freq: Once | INTRAVENOUS | Status: AC
  Administered 2017-08-19: 500 mL via INTRAVENOUS

## 2017-08-19 MED ORDER — ONDANSETRON HCL 4 MG/2ML IJ SOLN
4.0000 mg | Freq: Once | INTRAMUSCULAR | Status: AC | PRN
  Administered 2017-08-19: 4 mg via INTRAVENOUS
  Filled 2017-08-19: qty 2

## 2017-08-19 MED ORDER — ACETAMINOPHEN 325 MG PO TABS
650.0000 mg | ORAL_TABLET | Freq: Four times a day (QID) | ORAL | Status: DC | PRN
  Administered 2017-08-19: 650 mg via ORAL
  Filled 2017-08-19: qty 2

## 2017-08-19 MED ORDER — MORPHINE SULFATE (PF) 4 MG/ML IV SOLN
4.0000 mg | Freq: Once | INTRAVENOUS | Status: AC
  Administered 2017-08-19: 4 mg via INTRAVENOUS
  Filled 2017-08-19: qty 1

## 2017-08-19 MED ORDER — ONDANSETRON HCL 4 MG PO TABS
4.0000 mg | ORAL_TABLET | Freq: Four times a day (QID) | ORAL | Status: DC | PRN
  Administered 2017-08-19 – 2017-08-27 (×3): 4 mg via ORAL
  Filled 2017-08-19 (×3): qty 1

## 2017-08-19 MED ORDER — HYDROCODONE-ACETAMINOPHEN 10-325 MG PO TABS
1.0000 | ORAL_TABLET | ORAL | Status: DC | PRN
  Administered 2017-08-21 – 2017-08-27 (×19): 1 via ORAL
  Filled 2017-08-19 (×19): qty 1

## 2017-08-19 MED ORDER — ASPIRIN EC 81 MG PO TBEC
81.0000 mg | DELAYED_RELEASE_TABLET | Freq: Every day | ORAL | Status: DC
  Administered 2017-08-19 – 2017-08-23 (×5): 81 mg via ORAL
  Filled 2017-08-19 (×5): qty 1

## 2017-08-19 MED ORDER — ONDANSETRON HCL 4 MG/2ML IJ SOLN
4.0000 mg | Freq: Four times a day (QID) | INTRAMUSCULAR | Status: DC | PRN
  Administered 2017-08-20 – 2017-08-26 (×5): 4 mg via INTRAVENOUS
  Filled 2017-08-19 (×5): qty 2

## 2017-08-19 NOTE — ED Notes (Signed)
Patient transported to X-ray 

## 2017-08-19 NOTE — ED Notes (Signed)
Patient transported to CT 

## 2017-08-19 NOTE — H&P (Addendum)
History and Physical    [name] IHK:[id number] DOB: Dec 15, 1932 DOA: 08/19/2017  PCP: Burnard Bunting, MD   Patient coming from: home    Chief Complaint: N/V, abdominal pain, diarrhea, SOB   HPI: [name] is a 84 y.o. female with medical history significant of coronary artery disease status post MI, CVA during cardiac catheterization for coronary artery disease, hypertension, hyperlipidemia, breast cancer status post left lumpectomy in April 2019, type 2 diabetes on insulin who comes in with nausea, vomiting, diarrhea, abdominal pain and shortness of breath and orthopnea.  Per the daughter and the patient she was doing fairly well until about 3 weeks ago when she began to have exertional dyspnea and shortness of breath.  She also began to have some lower extremity swelling and orthopnea.  Patient has been increasingly orthopneic for over 2 weeks.  Patient recently had left lumpectomy and sentinel lymph node biopsy with placement of radioactive seed for mucinous carcinoma of the breast on 07/08/2017.  Approximately 1 week ago she began to develop erythema and redness of her left breast as well as significant breast pain and swelling.  She was started on Bactrim for presumed mastitis.  She reported the swelling and pain have improved however are still present however yesterday she began to develop nonbilious nonbloody emesis x4 as well as profuse watery diarrhea without any blood.  She also began to have right-sided crampy abdominal pain that was intermittent.  The pain would not improve with emesis or diarrhea.  Patient was unable to tolerate p.o. so she cannot tell me if the pain improved with p.o. intake.  She denies any fevers.  She does report a cough as well as some nasal congestion that is been ongoing for approximately 2 weeks.  She denies any chest pain but does have significant exertional dyspnea.  She has had orthopnea and paroxysmal nocturnal dyspnea.  ED Course: In the ED her  vital signs are notable for mild tachycardia.  Patient was requiring up to 4 L nasal cannula.  Chest x-ray showed bilateral pleural effusions concerning for pulmonary edema.  CT abdomen pelvis was performed which showed no acute intra-abdominal process but did show cardiomegaly and pleural effusions concerning for fluid overload.  C. difficile was ordered and is pending.  Troponin was 0.05.  BNP was over 400.  CBC was notable for a white blood cell count of 14.7.  Hemoglobin was 9.2 with a baseline of around 10-11.  CMP showed some hyperglycemia as well as a mildly elevated serum creatinine at 1.12 and a normal BUN.  Alk phos was elevated to 141.  She was given pain control with morphine, antiemetics, and significant amounts of IV fluids.  Review of Systems: As per HPI otherwise 10 point review of systems negative.    Past Medical History:  Diagnosis Date  . Altered mental status 04/26/2011  . ANEMIA 03/07/2009   Qualifier: Diagnosis of  By: Burnett Kanaris    . Arthritis   . Basal cell carcinoma of skin of eyelid, including canthus 10/08/2012  . Breast cancer ([place]) 06/24/2017  . Coronary artery disease   . Depression    after passing of husband   . Diabetes mellitus   . Diverticulosis   . Dyslipidemia 09/23/2012  . GERD (gastroesophageal reflux disease)   . Gout   . Gout   . History of myocardial infarction 09/23/2012  . Hypertension   . IBS (irritable bowel syndrome)   . Malignant neoplasm of upper-outer quadrant of left  breast in female, estrogen receptor positive ([place]) 05/22/2017  . Stroke [facility])     Past Surgical History:  Procedure Laterality Date  . ABDOMINAL HYSTERECTOMY    . APPENDECTOMY    . BACK SURGERY    . BREAST LUMPECTOMY WITH RADIOACTIVE SEED AND SENTINEL LYMPH NODE BIOPSY Left 07/08/2017   Procedure: LEFT BREAST LUMPECTOMY WITH RADIOACTIVE SEED AND SENTINEL LYMPH NODE BIOPSY;  Surgeon: Fanny Skates, MD;  Location: [facility];  Service: General;  Laterality: Left;  . cataract  surgery    . COLONOSCOPY    . skin cancer removed        reports that she has never smoked. She has never used smokeless tobacco. She reports that she does not drink alcohol or use drugs.  Allergies  Allergen Reactions  . Cephalexin Hives  . Ivp Dye [Iodinated Diagnostic Agents] Hives    Family History  Problem Relation Age of Onset  . Breast cancer Daughter 54  . Cancer Mother   . Cancer Father      Prior to Admission medications   Medication Sig Start Date End Date Taking? Authorizing Provider  escitalopram (LEXAPRO) 10 MG tablet Take 10 mg by mouth daily.  07/28/12  Yes [provider]  HYDROcodone-acetaminophen (NORCO) 10-325 MG per tablet Take 1 tablet by mouth every 4 (four) hours as needed. For pain.   Yes [provider]  insulin aspart (NOVOLOG) 100 UNIT/ML injection Inject into the skin 3 (three) times daily before meals. Sliding scale   Yes [provider]  insulin detemir (LEVEMIR) 100 UNIT/ML injection Inject 6 Units into the skin 2 (two) times daily.   Yes [provider]  omeprazole (PRILOSEC) 20 MG capsule Take 20 mg by mouth daily.    Yes [provider]  simvastatin (ZOCOR) 40 MG tablet Take 1 tablet (40 mg total) by mouth at bedtime. 06/25/17  Yes Richardo Priest, MD  sulfamethoxazole-trimethoprim (BACTRIM,SEPTRA) 400-80 MG tablet Take 1 tablet by mouth 2 (two) times daily. 08/13/17  Yes [provider]  tamoxifen (NOLVADEX) 20 MG tablet Take 1 tablet (20 mg total) by mouth daily. 08/02/17  Yes Nicholas Lose, MD  telmisartan (MICARDIS) 20 MG tablet Take 1 tablet (20 mg total) by mouth daily. 06/28/17  Yes Richardo Priest, MD  temazepam (RESTORIL) 15 MG capsule Take 15 mg by mouth at bedtime as needed. For sleep.   Yes [provider]  aspirin EC 81 MG tablet Take 81 mg by mouth daily.    [provider]    Physical Exam: Vitals:   08/19/17 0815 08/19/17 1000 08/19/17 1100 08/19/17 1330  BP: (!)  164/68 (!) 148/84 (!) 150/77 (!) 150/81  Pulse: (!) 101 97 97 (!) 101  Resp: '19 16 17 16  '$ Temp: 98.7 F (37.1 C)     TempSrc: Oral     SpO2: 95% 93% 92% 92%  Weight:      Height:        Constitutional: NAD, calm, comfortable Vitals:   08/19/17 0815 08/19/17 1000 08/19/17 1100 08/19/17 1330  BP: (!) 164/68 (!) 148/84 (!) 150/77 (!) 150/81  Pulse: (!) 101 97 97 (!) 101  Resp: '19 16 17 16  '$ Temp: 98.7 F (37.1 C)     TempSrc: Oral     SpO2: 95% 93% 92% 92%  Weight:      Height:       Eyes: Anicteric sclera ENMT: Mucous membranes, good dentition.  Neck: normal, supple Respiratory: Crackles in bilateral  lung fields, moderately increased work of breathing, accessory muscle use, scattered rhonchi, no wheezes, Cardiovascular: Distant heart sounds, regular rate and rhythm, no murmurs Abdomen: Soft, mildly tender to palpation on right upper quadrant, no rebound or guarding, plus bowel sounds Musculoskeletal: Trace lower extremity edema Skin: Erythematous and indurated right breast skin, not tender to palpation, mildly edematous  Neurologic: Grossly intact, moving all extremities Psychiatric: Normal judgment and insight. Alert and oriented x 3. Normal mood.     Labs on Admission: I have personally reviewed following labs and imaging studies  CBC: Recent Labs  Lab 08/19/17 0933  WBC 14.7*  HGB 9.2*  HCT 29.5*  MCV 94.2  PLT 696   Basic Metabolic Panel: Recent Labs  Lab 08/19/17 0933  NA 139  K 4.9  CL 109  CO2 21*  GLUCOSE 257*  BUN 16  CREATININE 1.12*  CALCIUM 8.8*   GFR: Estimated Creatinine Clearance: 37.8 mL/min (A) (by C-G formula based on SCr of 1.12 mg/dL (H)). Liver Function Tests: Recent Labs  Lab 08/19/17 0933  AST 26  ALT 12*  ALKPHOS 141*  BILITOT 0.6  PROT 7.0  ALBUMIN 3.8   Recent Labs  Lab 08/19/17 0933  LIPASE 17   No results for input(s): AMMONIA in the last 168 hours. Coagulation Profile: No results for input(s): INR, PROTIME in  the last 168 hours. Cardiac Enzymes: Recent Labs  Lab 08/19/17 1230  TROPONINI 0.05*   BNP (last 3 results) No results for input(s): PROBNP in the last 8760 hours. HbA1C: No results for input(s): HGBA1C in the last 72 hours. CBG: Recent Labs  Lab 08/19/17 1444  GLUCAP 237*   Lipid Profile: No results for input(s): CHOL, HDL, LDLCALC, TRIG, CHOLHDL, LDLDIRECT in the last 72 hours. Thyroid Function Tests: No results for input(s): TSH, T4TOTAL, FREET4, T3FREE, THYROIDAB in the last 72 hours. Anemia Panel: No results for input(s): VITAMINB12, FOLATE, FERRITIN, TIBC, IRON, RETICCTPCT in the last 72 hours. Urine analysis:    Component Value Date/Time   COLORURINE YELLOW 08/19/2017 0933   APPEARANCEUR CLEAR 08/19/2017 0933   LABSPEC 1.017 08/19/2017 0933   PHURINE 5.0 08/19/2017 0933   GLUCOSEU NEGATIVE 08/19/2017 0933   HGBUR SMALL (A) 08/19/2017 0933   BILIRUBINUR NEGATIVE 08/19/2017 0933   KETONESUR 5 (A) 08/19/2017 0933   PROTEINUR NEGATIVE 08/19/2017 0933   UROBILINOGEN 0.2 11/25/2012 2032   NITRITE NEGATIVE 08/19/2017 0933   LEUKOCYTESUR TRACE (A) 08/19/2017 0933    Radiological Exams on Admission: Ct Abdomen Pelvis Wo Contrast  Result Date: 08/19/2017 CLINICAL DATA:  Acute abdominal pain. Nausea, vomiting, and diarrhea. History of breast cancer with lumpectomy July 08, 2017. EXAM: CT ABDOMEN AND PELVIS WITHOUT CONTRAST TECHNIQUE: Multidetector CT imaging of the abdomen and pelvis was performed following the standard protocol without IV contrast. COMPARISON:  April 27, 2011 FINDINGS: Lower chest: Skin thickening in the inferior left breast, incompletely evaluated. Small to moderate bilateral pleural effusions. Large hiatal hernia. Mild cardiomegaly. A calcified nodule in the lingula is of no significance. Opacity under the bilateral effusions is likely atelectasis. No suspicious infiltrate identified. Hepatobiliary: No focal liver abnormality is seen. No gallstones,  gallbladder wall thickening, or biliary dilatation. Pancreas: Unremarkable. No pancreatic ductal dilatation or surrounding inflammatory changes. Spleen: Normal in size without focal abnormality. Adrenals/Urinary Tract: Adrenal glands are normal. No renal stones or hydronephrosis. No suspicious masses. The ureters and bladder are normal. Stomach/Bowel: Other than the hiatal hernia, the stomach is normal. The small bowel is normal. Colonic diverticulosis is  seen without diverticulitis. The colon is normal. The appendix is been removed by report. Vascular/Lymphatic: Atherosclerotic changes are seen in the nonaneurysmal tortuous aorta and iliac vessels. No adenopathy. Reproductive: Status post hysterectomy. No adnexal masses. Other: No free air or free fluid. Increased attenuation in the subcutaneous fat diffusely suggests volume overload. Musculoskeletal: Evaluation of the left hip and pelvic bones is limited due to motion. Cortical malalignment in the left pelvic bones on series 4, image 47 is thought to be artifactual due to motion. Recommend clinical correlation. Grade 1 anterolisthesis of L4 versus L5. No fractures or traumatic malalignment. IMPRESSION: 1. No cause for the patient's abdominal symptoms identified. 2. Skin thickening in the inferior left breast is incompletely evaluated. This could be due to recent surgery or radiation. Recommend clinical correlation. Given history, if there is concern for a breast infection, ultrasound could better evaluate. 3. Large hiatal hernia. 4. Mild cardiomegaly, pleural effusions, and subcutaneous fat edema suggests volume overload. 5. Colonic diverticulosis without diverticulitis. 6. Atherosclerotic changes in the aorta and iliac vessels. Electronically Signed   By: Dorise Bullion III M.D   On: 08/19/2017 15:23   Dg Abdomen Acute W/chest  Result Date: 08/19/2017 CLINICAL DATA:  Wheezing, congestion. EXAM: DG ABDOMEN ACUTE W/ 1V CHEST COMPARISON:  Radiographs of June 20, 2011. FINDINGS: Mild cardiomegaly is noted with central pulmonary vascular congestion. Probable mild diffuse bilateral pulmonary edema is noted. No pneumothorax is noted. No significant pleural effusion is noted. No free air is noted to suggest pneumoperitoneum. No abnormal bowel gas pattern is noted. No abnormal calcifications are noted. IMPRESSION: No evidence of bowel obstruction or ileus. Mild cardiomegaly is noted with mild central pulmonary vascular congestion and probable bilateral pulmonary edema. Electronically Signed   By: Marijo Conception, M.D.   On: 08/19/2017 09:29    EKG: Independently reviewed.  Sinus tachycardia, inverted T waves in precordial leads, no acute ST segment changes, widened QRS, relatively unchanged from prior except for widened QRS  Assessment/Plan Active Problems:   Hyperlipidemia   Coronary artery disease involving native coronary artery of native heart with angina pectoris (HCC)   Cerebral artery occlusion with cerebral infarction [facility])   Malignant neoplasm of upper-outer quadrant of left breast in female, estrogen receptor positive ([place])   H/O: CVA (cerebrovascular accident)   History of myocardial infarction   Diabetes mellitus ([place])   HTN (hypertension)   Acute hypoxemic respiratory failure ([place])   #) Acute hypoxic respiratory failure: Suspect the most likely cause of her respiratory symptoms are related to congestive heart failure exacerbation.  It is unclear if this is diastolic or systolic however she does have a coronary artery disease history that reportedly was never intervened upon because she developed a stroke during the cardiac catheterization.  Her troponin is only minimally elevated and her EKG while not benign does not show any acute changes. -2 g sodium restricted, 1.5 L fluid restricted, strict ins and outs, weigh daily -IV furosemide 40 mg twice daily - Echo ordered -Telemetry  #) Coronary artery disease: Patient reportedly has a history  of MI with no intervention - Cycle troponins -Continue aspirin 81 mg -Continue simvastatin 40 mg daily  #) nausea, vomiting, diarrhea: Patient was initially thought to be hypovolemic and was given IV fluids unfortunately it appears more likely that she is in heart failure.  The symptoms are either related to her heart failure or possibly related to the recent completion of her Bactrim. -C. difficile PCR -GI PCR panel  #)  Mastitis: At this time it appears that her erythema is improving.  She is completed a course of Bactrim and while she does have some redness there does not appear to be much evidence of infection at this time. - Breast ultrasound - Hold antibiotics at this time  #) Hypertension/hyperlipidemia: -Hold telmisartan 20 mg daily -Continue statin  #) Anemia: Relatively stable.  We will hold on work-up at this time -Monitor  #) Type 2 diabetes on insulin: -Continue aspart 3 units with meals -Continue detemir 6 units twice daily -Sliding scale insulin, AC at bedtime  #) Breast cancer: -Continue tamoxifen 20 mg daily  #) Pain/psych: -Continue escitalopram 10 mg daily -Continue temazepam 50 mg nightly  Fluids: Restricted Elect lites: Monitor and supplement Nutrition: Carb restricted, sodium restricted diet  Prophylaxis: Enoxaparin  Disposition: Pending improved respiratory status  DO NOT RESUSCITATE   Cristy Folks MD [facility]   If 7PM-7AM, please contact night-coverage [email or web address] Password [facility]  08/19/2017, 3:34 PM

## 2017-08-19 NOTE — ED Notes (Signed)
DR. Brigid Re AWARE OF PT'S CURRENT MENTATION AFTER PHENERGAN ADMINISTRATION. DR. HAS SEEN PT AND FAMILY. 02 2LNC. CURRENT VITALS GIVEN. DAUGHTER AWARE OF BED ASSIGNMENT. ABBY RN GIVEN REPORT.

## 2017-08-19 NOTE — ED Notes (Signed)
ED TO INPATIENT HANDOFF REPORT  Name/Age/Gender [name] 84 y.o. female  Code Status Code Status History    Date Active Date Inactive Code Status Order ID Comments User Context   04/25/2011 2123 05/01/2011 1755 DNR [id number]  Cleaster Corin, RN ED   04/25/2011 1904 04/25/2011 2123 DNR [id number]  Collene Gobble, MD ED    Questions for Most Recent Historical Code Status (Order [id number])    Question Answer Comment   Maintain current active treatments Yes    Do not initiate new interventions Yes         Advance Directive Documentation     Most Recent Value  Type of Advance Directive  Living will  Pre-existing out of facility DNR order (yellow form or pink MOST form)  -  "MOST" Form in Place?  -      Home/SNF/Other Home  Chief Complaint   Level of Care/Admitting Diagnosis ED Disposition    ED Disposition Condition [place]: [place] [100102]  Level of Care: Stepdown [14]  Admit to SDU based on following criteria: Hemodynamic compromise or significant risk of instability:  Patient requiring short term acute titration and management of vasoactive drips, and invasive monitoring (i.e., CVP and Arterial line).  Admit to SDU based on following criteria: Respiratory Distress:  Frequent assessment and/or intervention to maintain adequate ventilation/respiration, pulmonary toilet, and respiratory treatment.  Diagnosis: Acute hypoxemic respiratory failure [facility]) [3149702]  Admitting Physician: Cristy Folks [[id number]]  Attending Physician: Cristy Folks [phone]  Estimated length of stay: 3 - 4 days  Certification:: I certify this patient will need inpatient services for at least 2 midnights  PT Class (Do Not Modify): Inpatient [101]  PT Acc Code (Do Not Modify): Private [1]       Medical History Past Medical History:  Diagnosis Date  . Altered mental status 04/26/2011  . ANEMIA 03/07/2009   Qualifier: Diagnosis of  By:  Burnett Kanaris    . Arthritis   . Basal cell carcinoma of skin of eyelid, including canthus 10/08/2012  . Breast cancer ([place]) 06/24/2017  . Coronary artery disease   . Depression    after passing of husband   . Diabetes mellitus   . Diverticulosis   . Dyslipidemia 09/23/2012  . GERD (gastroesophageal reflux disease)   . Gout   . Gout   . History of myocardial infarction 09/23/2012  . Hypertension   . IBS (irritable bowel syndrome)   . Malignant neoplasm of upper-outer quadrant of left breast in female, estrogen receptor positive ([place]) 05/22/2017  . Stroke [facility])     Allergies Allergies  Allergen Reactions  . Cephalexin Hives  . Ivp Dye [Iodinated Diagnostic Agents] Hives    IV Location/Drains/Wounds Patient Lines/Drains/Airways Status   Active Line/Drains/Airways    Name:   Placement date:   Placement time:   Site:   Days:   Peripheral IV 08/19/17 Left Antecubital   08/19/17    0805    Antecubital   less than 1   External Urinary Catheter   08/19/17    1222    -   less than 1   Incision (Closed) 07/08/17 Breast Left   07/08/17    1359     42          Labs/Imaging Results for orders placed or performed during the hospital encounter of 08/19/17 (from the past 48 hour(s))  Lipase, blood     Status: None  Collection Time: 08/19/17  9:33 AM  Result Value Ref Range   Lipase 17 11 - 51 U/L    Comment: Performed at [facility], [place] [address], [place], [place] [address]  Comprehensive metabolic panel     Status: Abnormal   Collection Time: 08/19/17  9:33 AM  Result Value Ref Range   Sodium 139 135 - 145 mmol/L   Potassium 4.9 3.5 - 5.1 mmol/L   Chloride 109 101 - 111 mmol/L   CO2 21 (L) 22 - 32 mmol/L   Glucose, Bld 257 (H) 65 - 99 mg/dL   BUN 16 6 - 20 mg/dL   Creatinine, Ser 1.12 (H) 0.44 - 1.00 mg/dL   Calcium 8.8 (L) 8.9 - 10.3 mg/dL   Total Protein 7.0 6.5 - 8.1 g/dL   Albumin 3.8 3.5 - 5.0 g/dL   AST 26 15 - 41 U/L   ALT 12 (L) 14 - 54 U/L    Alkaline Phosphatase 141 (H) 38 - 126 U/L   Total Bilirubin 0.6 0.3 - 1.2 mg/dL   GFR calc non Af Amer 44 (L) >60 mL/min   GFR calc Af Amer 52 (L) >60 mL/min    Comment: (NOTE) The eGFR has been calculated using the CKD EPI equation. This calculation has not been validated in all clinical situations. eGFR's persistently <60 mL/min signify possible Chronic Kidney Disease.    Anion gap 9 5 - 15    Comment: Performed at [facility], [place] [address] [place], [place] [address]  CBC     Status: Abnormal   Collection Time: 08/19/17  9:33 AM  Result Value Ref Range   WBC 14.7 (H) 4.0 - 10.5 K/uL   RBC 3.13 (L) 3.87 - 5.11 MIL/uL   Hemoglobin 9.2 (L) 12.0 - 15.0 g/dL   HCT 29.5 (L) 36.0 - 46.0 %   MCV 94.2 78.0 - 100.0 fL   MCH 29.4 26.0 - 34.0 pg   MCHC 31.2 30.0 - 36.0 g/dL   RDW 14.2 11.5 - 15.5 %   Platelets 394 150 - 400 K/uL    Comment: Performed at [facility], [place] [address], [place], [place] [address]  Urinalysis, Routine w reflex microscopic     Status: Abnormal   Collection Time: 08/19/17  9:33 AM  Result Value Ref Range   Color, Urine YELLOW YELLOW   APPearance CLEAR CLEAR   Specific Gravity, Urine 1.017 1.005 - 1.030   pH 5.0 5.0 - 8.0   Glucose, UA NEGATIVE NEGATIVE mg/dL   Hgb urine dipstick SMALL (A) NEGATIVE   Bilirubin Urine NEGATIVE NEGATIVE   Ketones, ur 5 (A) NEGATIVE mg/dL   Protein, ur NEGATIVE NEGATIVE mg/dL   Nitrite NEGATIVE NEGATIVE   Leukocytes, UA TRACE (A) NEGATIVE   RBC / HPF 0-5 0 - 5 RBC/hpf   WBC, UA 0-5 0 - 5 WBC/hpf   Bacteria, UA RARE (A) NONE SEEN   Squamous Epithelial / LPF 0-5 0 - 5    Comment: Performed at [facility], [place] [address] [place], [place] [address]  Brain natriuretic peptide     Status: Abnormal   Collection Time: 08/19/17  9:33 AM  Result Value Ref Range   B Natriuretic Peptide 409.6 (H) 0.0 - 100.0 pg/mL    Comment: Performed at [facility],  [place] [address] [place], [place] [address]  Troponin I     Status: Abnormal   Collection Time: 08/19/17 12:30 PM  Result Value Ref Range   Troponin I 0.05 (HH) <0.03 ng/mL    Comment: CRITICAL RESULT CALLED TO, READ BACK BY AND VERIFIED WITH: BINGHAM,S. RN _0  ON 05.20.19 BY COHEN,K Performed at [facility], [place] [address], [place], [place] [address]   POC CBG, ED     Status: Abnormal   Collection Time: 08/19/17  2:44 PM  Result Value Ref Range   Glucose-Capillary 237 (H) 65 - 99 mg/dL   Ct Abdomen Pelvis Wo Contrast  Result Date: 08/19/2017 CLINICAL DATA:  Acute abdominal pain. Nausea, vomiting, and diarrhea. History of breast cancer with lumpectomy July 08, 2017. EXAM: CT ABDOMEN AND PELVIS WITHOUT CONTRAST TECHNIQUE: Multidetector CT imaging of the abdomen and pelvis was performed following the standard protocol without IV contrast. COMPARISON:  April 27, 2011 FINDINGS: Lower chest: Skin thickening in the inferior left breast, incompletely evaluated. Small to moderate bilateral pleural effusions. Large hiatal hernia. Mild cardiomegaly. A calcified nodule in the lingula is of no significance. Opacity under the bilateral effusions is likely atelectasis. No suspicious infiltrate identified. Hepatobiliary: No focal liver abnormality is seen. No gallstones, gallbladder wall thickening, or biliary dilatation. Pancreas: Unremarkable. No pancreatic ductal dilatation or surrounding inflammatory changes. Spleen: Normal in size without focal abnormality. Adrenals/Urinary Tract: Adrenal glands are normal. No renal stones or hydronephrosis. No suspicious masses. The ureters and bladder are normal. Stomach/Bowel: Other than the hiatal hernia, the stomach is normal. The small bowel is normal. Colonic diverticulosis is seen without diverticulitis. The colon is normal. The appendix is been removed by report. Vascular/Lymphatic: Atherosclerotic changes are seen in the nonaneurysmal tortuous  aorta and iliac vessels. No adenopathy. Reproductive: Status post hysterectomy. No adnexal masses. Other: No free air or free fluid. Increased attenuation in the subcutaneous fat diffusely suggests volume overload. Musculoskeletal: Evaluation of the left hip and pelvic bones is limited due to motion. Cortical malalignment in the left pelvic bones on series 4, image 47 is thought to be artifactual due to motion. Recommend clinical correlation. Grade 1 anterolisthesis of L4 versus L5. No fractures or traumatic malalignment. IMPRESSION: 1. No cause for the patient's abdominal symptoms identified. 2. Skin thickening in the inferior left breast is incompletely evaluated. This could be due to recent surgery or radiation. Recommend clinical correlation. Given history, if there is concern for a breast infection, ultrasound could better evaluate. 3. Large hiatal hernia. 4. Mild cardiomegaly, pleural effusions, and subcutaneous fat edema suggests volume overload. 5. Colonic diverticulosis without diverticulitis. 6. Atherosclerotic changes in the aorta and iliac vessels. Electronically Signed   By: Dorise Bullion III M.D   On: 08/19/2017 15:23   Dg Abdomen Acute W/chest  Result Date: 08/19/2017 CLINICAL DATA:  Wheezing, congestion. EXAM: DG ABDOMEN ACUTE W/ 1V CHEST COMPARISON:  Radiographs of June 20, 2011. FINDINGS: Mild cardiomegaly is noted with central pulmonary vascular congestion. Probable mild diffuse bilateral pulmonary edema is noted. No pneumothorax is noted. No significant pleural effusion is noted. No free air is noted to suggest pneumoperitoneum. No abnormal bowel gas pattern is noted. No abnormal calcifications are noted. IMPRESSION: No evidence of bowel obstruction or ileus. Mild cardiomegaly is noted with mild central pulmonary vascular congestion and probable bilateral pulmonary edema. Electronically Signed   By: Marijo Conception, M.D.   On: 08/19/2017 09:29    Pending Labs Unresulted Labs (From  admission, onward)   Start     Ordered   08/19/17 1504  C difficile quick scan w PCR reflex  (C Difficile quick screen  w PCR reflex panel)  Once, for 24 hours,   R     08/19/17 1503   Signed and Held  C difficile quick scan w PCR reflex  (C Difficile quick screen w PCR reflex panel)  Once, for 24 hours,   R     Signed and Held   Signed and Held  Gastrointestinal Panel by PCR , Stool  (Gastrointestinal Panel by PCR, Stool)  Once,   R     Signed and Held   Signed and Held  TSH  Once,   R     Signed and Held   Signed and Held  Basic metabolic panel  Tomorrow morning,   R     Signed and Held   Signed and Held  CBC  Tomorrow morning,   R     Signed and Held   Signed and Held  Troponin I  Now then every 6 hours,   R     Signed and Held      Vitals/Pain Today's Vitals   08/19/17 1224 08/19/17 1330 08/19/17 1600 08/19/17 1611  BP:  (!) 150/81 (!) 143/106   Pulse:  (!) 101 100   Resp:  16 20   Temp:   98 F (36.7 C)   TempSrc:   Oral   SpO2:  92% 100%   Weight:      Height:      PainSc: 0-No pain   2     Isolation Precautions Enteric precautions (UV disinfection)  Medications Medications  furosemide (LASIX) injection 60 mg (has no administration in time range)  ondansetron (ZOFRAN) injection 4 mg (4 mg Intravenous Given 08/19/17 0926)  sodium chloride 0.9 % bolus 500 mL (0 mLs Intravenous Stopped 08/19/17 0955)  morphine 4 MG/ML injection 4 mg (4 mg Intravenous Given 08/19/17 0927)  promethazine (PHENERGAN) injection 12.5 mg (12.5 mg Intravenous Given 08/19/17 1604)  morphine 4 MG/ML injection 4 mg (4 mg Intravenous Given 08/19/17 1542)    Mobility walks with device

## 2017-08-19 NOTE — ED Triage Notes (Signed)
Transported by [facility] from home--acute onset of abdominal pain that started yesterday +n/v/d. Also hx of breast CA--had lumpectomy done on 07/08/17 and daughter told EMS patient has not had a "full recovery." Pt saw her PCP last week for redness/swelling to incision site and finished Baclofen prescription yesterday. Other associated symptoms: generalized weakness, mild confusion, cough and SHOB. EMS administered 100 mcg of Fentanyl IV, 8 mg of Zofran IV and 500 cc of NS.

## 2017-08-19 NOTE — ED Notes (Signed)
Bed: IH53 Expected date:  Expected time:  Means of arrival:  Comments: EMS-multiple complaints

## 2017-08-19 NOTE — ED Notes (Signed)
ADMITTING MD PRESENT SPEAKING WITH DAUGHTER AND PT

## 2017-08-19 NOTE — ED Notes (Signed)
ED Provider at bedside. J KNAPP SPEAKING WITH DAUGHTER

## 2017-08-19 NOTE — ED Notes (Signed)
PER DR. Brigid Re- NO ORDERS NEEDED FOR LACTIC , BLOOD CULTURE.

## 2017-08-19 NOTE — ED Provider Notes (Signed)
[facility] DEPT Provider Note   CSN: [id number] Arrival date & time: 08/19/17  4315     History   Chief Complaint Chief Complaint  Patient presents with  . Weakness  . Nausea  . Emesis  . Diarrhea  . Abdominal Pain    HPI [name] is a 84 y.o. female.  HPI Pt presents to the ED with complaints of vomiting and diarrhea all night long.  Pt states she has not felt well the last couple of days but can't really specify.  Last night she started having vomiting and diarrhea.  She vomited diarrhea approximately 3 times but the diarrhea was much more frequent.  No blood in stool or emesis noted.  No known fevers.  She was on bactrim recently for a wound infection.  She finished the abx yesterday.  The redness has improved.    Past Medical History:  Diagnosis Date  . Altered mental status 04/26/2011  . ANEMIA 03/07/2009   Qualifier: Diagnosis of  By: Burnett Kanaris    . Arthritis   . Basal cell carcinoma of skin of eyelid, including canthus 10/08/2012  . Breast cancer ([place]) 06/24/2017  . Coronary artery disease   . Depression    after passing of husband   . Diabetes mellitus   . Diverticulosis   . Dyslipidemia 09/23/2012  . GERD (gastroesophageal reflux disease)   . Gout   . Gout   . History of myocardial infarction 09/23/2012  . Hypertension   . IBS (irritable bowel syndrome)   . Malignant neoplasm of upper-outer quadrant of left breast in female, estrogen receptor positive ([place]) 05/22/2017  . Stroke [facility])     Patient Active Problem List   Diagnosis Date Noted  . Breast cancer ([place]) 06/24/2017  . Preoperative cardiovascular examination 06/24/2017  . Malignant neoplasm of upper-outer quadrant of left breast in female, estrogen receptor positive ([place]) 05/22/2017  . Basal cell carcinoma of skin of eyelid, including canthus 10/08/2012  . Dyslipidemia 09/23/2012  . H/O: CVA (cerebrovascular accident) 09/23/2012  . History of  gastroesophageal reflux (GERD) 09/23/2012  . History of myocardial infarction 09/23/2012  . Diabetes mellitus ([place]) 09/23/2012  . HTN (hypertension) 09/23/2012  . Altered mental status 04/26/2011  . DM 03/07/2009  . Hyperlipidemia 03/07/2009  . ANEMIA 03/07/2009  . Essential hypertension 03/07/2009  . Coronary artery disease involving native coronary artery of native heart with angina pectoris ([place]) 03/07/2009  . CVA 03/07/2009  . GERD 03/07/2009  . IRRITABLE BOWEL SYNDROME 03/07/2009    Past Surgical History:  Procedure Laterality Date  . ABDOMINAL HYSTERECTOMY    . APPENDECTOMY    . BACK SURGERY    . BREAST LUMPECTOMY WITH RADIOACTIVE SEED AND SENTINEL LYMPH NODE BIOPSY Left 07/08/2017   Procedure: LEFT BREAST LUMPECTOMY WITH RADIOACTIVE SEED AND SENTINEL LYMPH NODE BIOPSY;  Surgeon: Fanny Skates, MD;  Location: [facility];  Service: General;  Laterality: Left;  . cataract surgery    . COLONOSCOPY    . skin cancer removed        OB History   None      Home Medications    Prior to Admission medications   Medication Sig Start Date End Date Taking? Authorizing Provider  escitalopram (LEXAPRO) 10 MG tablet Take 10 mg by mouth daily.  07/28/12  Yes [provider]  HYDROcodone-acetaminophen (NORCO) 10-325 MG per tablet Take 1 tablet by mouth every 4 (four) hours as needed. For pain.   Yes [provider]  insulin aspart (NOVOLOG) 100 UNIT/ML injection Inject into the skin 3 (three) times daily before meals. Sliding scale   Yes [provider]  insulin detemir (LEVEMIR) 100 UNIT/ML injection Inject 6 Units into the skin 2 (two) times daily.   Yes [provider]  omeprazole (PRILOSEC) 20 MG capsule Take 20 mg by mouth daily.    Yes [provider]  simvastatin (ZOCOR) 40 MG tablet Take 1 tablet (40 mg total) by mouth at bedtime. 06/25/17  Yes Richardo Priest, MD  sulfamethoxazole-trimethoprim (BACTRIM,SEPTRA) 400-80 MG tablet Take 1 tablet  by mouth 2 (two) times daily. 08/13/17  Yes [provider]  tamoxifen (NOLVADEX) 20 MG tablet Take 1 tablet (20 mg total) by mouth daily. 08/02/17  Yes Nicholas Lose, MD  telmisartan (MICARDIS) 20 MG tablet Take 1 tablet (20 mg total) by mouth daily. 06/28/17  Yes Richardo Priest, MD  temazepam (RESTORIL) 15 MG capsule Take 15 mg by mouth at bedtime as needed. For sleep.   Yes [provider]  aspirin EC 81 MG tablet Take 81 mg by mouth daily.    [provider]    Family History Family History  Problem Relation Age of Onset  . Breast cancer Daughter 64  . Cancer Mother   . Cancer Father     Social History Social History   Tobacco Use  . Smoking status: Never Smoker  . Smokeless tobacco: Never Used  Substance Use Topics  . Alcohol use: No    Comment: Unable to ascertain due to altered mental status.  . Drug use: No     Allergies   Cephalexin and Ivp dye [iodinated diagnostic agents]   Review of Systems Review of Systems  Constitutional: Negative for fever.  Respiratory: Positive for cough.   Neurological: Positive for weakness. Negative for tremors, speech difficulty, numbness and headaches.  All other systems reviewed and are negative.    Physical Exam Updated Vital Signs BP (!) 150/81   Pulse (!) 101   Temp 98.7 F (37.1 C) (Oral)   Resp 16   Ht 1.651 m (5\' 5" )   Wt 68.9 kg (152 lb)   SpO2 92%   BMI 25.29 kg/m   Physical Exam  Constitutional: She appears ill. No distress.  HENT:  Head: Normocephalic and atraumatic.  Right Ear: External ear normal.  Left Ear: External ear normal.  Eyes: Conjunctivae are normal. Right eye exhibits no discharge. Left eye exhibits no discharge. No scleral icterus.  Neck: Neck supple. No tracheal deviation present.  Cardiovascular: Normal rate, regular rhythm and intact distal pulses.  Pulmonary/Chest: Effort normal and breath sounds normal. No stridor. No respiratory distress. She has no wheezes.  She has no rales.  Incision left breast without erythema  Abdominal: Soft. Bowel sounds are normal. She exhibits no distension and no mass. There is generalized tenderness. There is guarding. There is no rigidity and no rebound. No hernia.  Musculoskeletal: She exhibits no edema or tenderness.  Neurological: She is alert. She has normal strength. No cranial nerve deficit (no facial droop, extraocular movements intact, no slurred speech) or sensory deficit. She exhibits normal muscle tone. She displays no seizure activity. Coordination normal.  Skin: Skin is warm and dry. No rash noted. There is pallor.  Psychiatric: She has a normal mood and affect.  Nursing note and vitals reviewed.    ED Treatments / Results  Labs (all labs ordered are listed, but only abnormal results are displayed) Labs Reviewed  COMPREHENSIVE METABOLIC  PANEL - Abnormal; Notable for the following components:      Result Value   CO2 21 (*)    Glucose, Bld 257 (*)    Creatinine, Ser 1.12 (*)    Calcium 8.8 (*)    ALT 12 (*)    Alkaline Phosphatase 141 (*)    GFR calc non Af Amer 44 (*)    GFR calc Af Amer 52 (*)    All other components within normal limits  CBC - Abnormal; Notable for the following components:   WBC 14.7 (*)    RBC 3.13 (*)    Hemoglobin 9.2 (*)    HCT 29.5 (*)    All other components within normal limits  URINALYSIS, ROUTINE W REFLEX MICROSCOPIC - Abnormal; Notable for the following components:   Hgb urine dipstick SMALL (*)    Ketones, ur 5 (*)    Leukocytes, UA TRACE (*)    Bacteria, UA RARE (*)    All other components within normal limits  BRAIN NATRIURETIC PEPTIDE - Abnormal; Notable for the following components:   B Natriuretic Peptide 409.6 (*)    All other components within normal limits  TROPONIN I - Abnormal; Notable for the following components:   Troponin I 0.05 (*)    All other components within normal limits  CBG MONITORING, ED - Abnormal; Notable for the following  components:   Glucose-Capillary 237 (*)    All other components within normal limits  C DIFFICILE QUICK SCREEN W PCR REFLEX  LIPASE, BLOOD    EKG EKG Interpretation  Date/Time:  Monday Aug 19 2017 08:13:14 EDT Ventricular Rate:  100 PR Interval:    QRS Duration: 98 QT Interval:  366 QTC Calculation: 473 R Axis:   49 Text Interpretation:  Sinus tachycardia Low voltage, extremity leads Minimal ST depression, lateral leads t wave inversion resolved compared to prior tracing Confirmed by Dorie Rank [phone]) on 08/19/2017 9:28:34 AM Also confirmed by Dorie Rank [phone]), editor Lynder Parents [phone])  on 08/19/2017 2:41:37 PM   Radiology Dg Abdomen Acute W/chest  Result Date: 08/19/2017 CLINICAL DATA:  Wheezing, congestion. EXAM: DG ABDOMEN ACUTE W/ 1V CHEST COMPARISON:  Radiographs of June 20, 2011. FINDINGS: Mild cardiomegaly is noted with central pulmonary vascular congestion. Probable mild diffuse bilateral pulmonary edema is noted. No pneumothorax is noted. No significant pleural effusion is noted. No free air is noted to suggest pneumoperitoneum. No abnormal bowel gas pattern is noted. No abnormal calcifications are noted. IMPRESSION: No evidence of bowel obstruction or ileus. Mild cardiomegaly is noted with mild central pulmonary vascular congestion and probable bilateral pulmonary edema. Electronically Signed   By: Marijo Conception, M.D.   On: 08/19/2017 09:29    Procedures Procedures (including critical care time)  Medications Ordered in ED Medications  sodium chloride 0.9 % bolus 500 mL (0 mLs Intravenous Stopped 08/19/17 0955)    Followed by  0.9 %  sodium chloride infusion (1,000 mLs Intravenous New Bag/Given 08/19/17 0926)  promethazine (PHENERGAN) injection 12.5 mg (has no administration in time range)  morphine 4 MG/ML injection 4 mg (has no administration in time range)  ondansetron (ZOFRAN) injection 4 mg (4 mg Intravenous Given 08/19/17 0926)  morphine 4 MG/ML injection  4 mg (4 mg Intravenous Given 08/19/17 0350)     Initial Impression / Assessment and Plan / ED Course  I have reviewed the triage vital signs and the nursing notes.  Pertinent labs & imaging results that were available during my care of the  patient were reviewed by me and considered in my medical decision making (see chart for details).  Clinical Course as of Aug 20 1503  Mon Aug 19, 2017  1102 WBC elevated compared to previous.  Anemia trending downward.   [DG]  3875 CXR with possibe CHF findings.  WIll add on BNP.     [IE]  3329 Pt continues to feel nauseated.  BNP and trop elevated.  Suspect CHF as a component of her symptoms   [JK]  1502 Still having nausea and pain, no vomiting here.  Pt is chronically on opiates from her arthritis.    [JK]    Clinical Course User Index [JK] Dorie Rank, MD    Patient presented to the emergency room for evaluation of vomiting and diarrhea after recently being on antibiotics.  Patient also complains of shortness of breath.  Patient's laboratory tests are notable for possible congestive heart failure with slight elevation in her BNP and troponin.  Patient was noted to have an oxygen requirement.  Will plan on admission to the hospital for further evaluation, consider echo , possible gentle diuresis.  CT scan ordered to evaluate for colitis.  Final Clinical Impressions(s) / ED Diagnoses   Final diagnoses:  Nausea vomiting and diarrhea  Congestive heart failure, unspecified HF chronicity, unspecified heart failure type [facility])      Dorie Rank, MD 08/19/17 1505

## 2017-08-19 NOTE — ED Notes (Signed)
ED Provider at bedside. 

## 2017-08-20 ENCOUNTER — Inpatient Hospital Stay (HOSPITAL_COMMUNITY): Payer: Medicare Other

## 2017-08-20 DIAGNOSIS — Z170 Estrogen receptor positive status [ER+]: Secondary | ICD-10-CM

## 2017-08-20 DIAGNOSIS — E785 Hyperlipidemia, unspecified: Secondary | ICD-10-CM

## 2017-08-20 DIAGNOSIS — C50412 Malignant neoplasm of upper-outer quadrant of left female breast: Secondary | ICD-10-CM

## 2017-08-20 DIAGNOSIS — I5021 Acute systolic (congestive) heart failure: Secondary | ICD-10-CM

## 2017-08-20 DIAGNOSIS — I361 Nonrheumatic tricuspid (valve) insufficiency: Secondary | ICD-10-CM

## 2017-08-20 DIAGNOSIS — R748 Abnormal levels of other serum enzymes: Secondary | ICD-10-CM

## 2017-08-20 LAB — CBC
HCT: 30.1 % — ABNORMAL LOW (ref 36.0–46.0)
Hemoglobin: 9.4 g/dL — ABNORMAL LOW (ref 12.0–15.0)
MCH: 29.1 pg (ref 26.0–34.0)
MCHC: 31.2 g/dL (ref 30.0–36.0)
MCV: 93.2 fL (ref 78.0–100.0)
Platelets: 382 K/uL (ref 150–400)
RBC: 3.23 MIL/uL — ABNORMAL LOW (ref 3.87–5.11)
RDW: 14.2 % (ref 11.5–15.5)
WBC: 9.7 10*3/uL (ref 4.0–10.5)

## 2017-08-20 LAB — GLUCOSE, CAPILLARY
Glucose-Capillary: 118 mg/dL — ABNORMAL HIGH (ref 65–99)
Glucose-Capillary: 135 mg/dL — ABNORMAL HIGH (ref 65–99)
Glucose-Capillary: 215 mg/dL — ABNORMAL HIGH (ref 65–99)
Glucose-Capillary: 115 mg/dL — ABNORMAL HIGH (ref 65–99)
Glucose-Capillary: 53 mg/dL — ABNORMAL LOW (ref 65–99)
Glucose-Capillary: 67 mg/dL (ref 65–99)
Glucose-Capillary: 269 mg/dL — ABNORMAL HIGH (ref 65–99)

## 2017-08-20 LAB — ECHOCARDIOGRAM COMPLETE
Height: 65 in
Weight: 2432 [oz_av]

## 2017-08-20 LAB — BASIC METABOLIC PANEL WITH GFR
Anion gap: 14 (ref 5–15)
Calcium: 8.9 mg/dL (ref 8.9–10.3)
GFR calc Af Amer: 43 mL/min — ABNORMAL LOW (ref >60)
GFR calc non Af Amer: 37 mL/min — ABNORMAL LOW (ref >60)
Glucose, Bld: 132 mg/dL — ABNORMAL HIGH (ref 65–99)

## 2017-08-20 LAB — BASIC METABOLIC PANEL
BUN: 18 mg/dL (ref 6–20)
CO2: 21 mmol/L — ABNORMAL LOW (ref 22–32)
Chloride: 101 mmol/L (ref 101–111)
Creatinine, Ser: 1.29 mg/dL — ABNORMAL HIGH (ref 0.44–1.00)
Potassium: 4.2 mmol/L (ref 3.5–5.1)
Sodium: 136 mmol/L (ref 135–145)

## 2017-08-20 LAB — PROTIME-INR
INR: 1.18
PROTHROMBIN TIME: 14.9 s (ref 11.4–15.2)

## 2017-08-20 LAB — TROPONIN I
Troponin I: 0.09 ng/mL (ref <0.03)
Troponin I: 0.09 ng/mL (ref <0.03)

## 2017-08-20 LAB — MRSA PCR SCREENING: MRSA by PCR: NEGATIVE

## 2017-08-20 MED ORDER — ACETAMINOPHEN 10 MG/ML IV SOLN
1000.0000 mg | Freq: Once | INTRAVENOUS | Status: AC | PRN
  Filled 2017-08-20: qty 100

## 2017-08-20 MED ORDER — ORAL CARE MOUTH RINSE
15.0000 mL | Freq: Two times a day (BID) | OROMUCOSAL | Status: DC
  Administered 2017-08-20 – 2017-08-27 (×14): 15 mL via OROMUCOSAL

## 2017-08-20 MED ORDER — MEPERIDINE HCL 25 MG/ML IJ SOLN: 6.2500 mg | INTRAMUSCULAR | Status: DC | PRN

## 2017-08-20 MED ORDER — PROMETHAZINE HCL 25 MG/ML IJ SOLN
6.2500 mg | INTRAMUSCULAR | Status: AC | PRN
  Administered 2017-08-24 – 2017-08-25 (×2): 12.5 mg via INTRAVENOUS
  Filled 2017-08-20 (×2): qty 1

## 2017-08-20 MED ORDER — ENOXAPARIN SODIUM 40 MG/0.4ML ~~LOC~~ SOLN
40.0000 mg | SUBCUTANEOUS | Status: DC
  Administered 2017-08-20 – 2017-08-21 (×2): 40 mg via SUBCUTANEOUS
  Filled 2017-08-20: qty 0.4

## 2017-08-20 MED ORDER — METOPROLOL TARTRATE 25 MG PO TABS
25.0000 mg | ORAL_TABLET | Freq: Two times a day (BID) | ORAL | Status: DC
  Administered 2017-08-20 – 2017-08-22 (×4): 25 mg via ORAL
  Filled 2017-08-20 (×4): qty 1

## 2017-08-20 MED ORDER — HEPARIN (PORCINE) IN NACL 100-0.45 UNIT/ML-% IJ SOLN
850.0000 [IU]/h | INTRAMUSCULAR | Status: DC
  Administered 2017-08-20: 850 [IU]/h via INTRAVENOUS
  Filled 2017-08-20 (×2): qty 250

## 2017-08-20 MED ORDER — HYDROMORPHONE HCL 1 MG/ML IJ SOLN
0.2500 mg | INTRAMUSCULAR | Status: DC | PRN
  Administered 2017-08-25 – 2017-08-26 (×3): 0.5 mg via INTRAVENOUS
  Filled 2017-08-20 (×3): qty 1

## 2017-08-20 MED ORDER — METOPROLOL SUCCINATE ER 25 MG PO TB24: 25.0000 mg | ORAL_TABLET | Freq: Every day | ORAL | Status: DC

## 2017-08-20 MED ORDER — HEPARIN BOLUS VIA INFUSION
4000.0000 [IU] | Freq: Once | INTRAVENOUS | Status: AC
  Administered 2017-08-20: 4000 [IU] via INTRAVENOUS
  Filled 2017-08-20: qty 4000

## 2017-08-20 MED ORDER — HYDROCODONE-ACETAMINOPHEN 7.5-325 MG PO TABS
1.0000 | ORAL_TABLET | Freq: Once | ORAL | Status: AC | PRN
  Administered 2017-08-25: 1 via ORAL
  Filled 2017-08-20: qty 1

## 2017-08-20 NOTE — Progress Notes (Signed)
[place]ane for IV heparin Indication: chest pain/ACS  Allergies  Allergen Reactions  . Cephalexin Hives  . Ivp Dye [Iodinated Diagnostic Agents] Hives    Patient Measurements: Height: 5\' 5"  (165.1 cm) Weight: 152 lb (68.9 kg) IBW/kg (Calculated) : 57 Heparin Dosing Weight: TBW  Vital Signs: Temp: 98.7 F (37.1 C) (05/21 0306) Temp Source: Oral (05/21 0306) BP: 157/54 (05/21 0600) Pulse Rate: 92 (05/21 0753)  Labs: Recent Labs    08/19/17 0933 08/19/17 1230 08/19/17 1816 08/20/17 0020 08/20/17 0654  HGB 9.2*  --   --   --  9.4*  HCT 29.5*  --   --   --  30.1*  PLT 394  --   --   --  382  CREATININE 1.12*  --   --   --   --   TROPONINI  --  0.05* 0.08* 0.09*  --     Estimated Creatinine Clearance: 37.8 mL/min (A) (by C-G formula based on SCr of 1.12 mg/dL (H)).   Medical History: Past Medical History:  Diagnosis Date  . Altered mental status 04/26/2011  . ANEMIA 03/07/2009   Qualifier: Diagnosis of  By: Burnett Kanaris    . Arthritis   . Basal cell carcinoma of skin of eyelid, including canthus 10/08/2012  . Breast cancer ([place]) 06/24/2017  . Coronary artery disease   . Depression    after passing of husband   . Diabetes mellitus   . Diverticulosis   . Dyslipidemia 09/23/2012  . GERD (gastroesophageal reflux disease)   . Gout   . Gout   . History of myocardial infarction 09/23/2012  . Hypertension   . IBS (irritable bowel syndrome)   . Malignant neoplasm of upper-outer quadrant of left breast in female, estrogen receptor positive ([place]) 05/22/2017  . Stroke [facility])     Medications:  Medications Prior to Admission  Medication Sig Dispense Refill Last Dose  . escitalopram (LEXAPRO) 10 MG tablet Take 10 mg by mouth daily.    08/18/2017 at Unknown time  . HYDROcodone-acetaminophen (NORCO) 10-325 MG per tablet Take 1 tablet by mouth every 4 (four) hours as needed. For pain.   08/18/2017 at Unknown time  . insulin aspart  (NOVOLOG) 100 UNIT/ML injection Inject into the skin 3 (three) times daily before meals. Sliding scale   08/18/2017 at Unknown time  . insulin detemir (LEVEMIR) 100 UNIT/ML injection Inject 6 Units into the skin 2 (two) times daily.   08/18/2017 at Unknown time  . omeprazole (PRILOSEC) 20 MG capsule Take 20 mg by mouth daily.    08/18/2017 at Unknown time  . simvastatin (ZOCOR) 40 MG tablet Take 1 tablet (40 mg total) by mouth at bedtime. 90 tablet 3 08/18/2017 at Unknown time  . sulfamethoxazole-trimethoprim (BACTRIM,SEPTRA) 400-80 MG tablet Take 1 tablet by mouth 2 (two) times daily.  0 08/18/2017 at Unknown time  . tamoxifen (NOLVADEX) 20 MG tablet Take 1 tablet (20 mg total) by mouth daily. 90 tablet 3 08/18/2017 at Unknown time  . telmisartan (MICARDIS) 20 MG tablet Take 1 tablet (20 mg total) by mouth daily. 30 tablet 11 08/18/2017 at Unknown time  . temazepam (RESTORIL) 15 MG capsule Take 15 mg by mouth at bedtime as needed. For sleep.   08/18/2017 at Unknown time  . aspirin EC 81 MG tablet Take 81 mg by mouth daily.   Past Week at Unknown time   Scheduled:  . aspirin EC  81 mg Oral Daily  . escitalopram  10 mg Oral Daily  . furosemide  40 mg Intravenous BID  . heparin  4,000 Units Intravenous Once  . insulin aspart  0-9 Units Subcutaneous TID WC  . insulin aspart  3 Units Subcutaneous TID AC  . insulin detemir  6 Units Subcutaneous BID  . pantoprazole  40 mg Oral Daily  . simvastatin  40 mg Oral QHS   PRN: acetaminophen **OR** acetaminophen, hydrALAZINE, HYDROcodone-acetaminophen, ondansetron **OR** ondansetron (ZOFRAN) IV, temazepam  Assessment: 38 yoF with PMH CAD s/p MI who underwent cardiac cath (>10 yr ago) which had to be aborted d/t ischemic CVA during the procedure, also Hx of HTN, HLD, DM2, breast CA s/p lumpectomy with seed placement in April, presents with progressive SOB and cough x 3 weeks and new n/v/d, abdominal pain. Troponins rising slightly this AM and EKG changes of  uncertain significance, so Pharmacy consulted to dose IV heparin for ACS.   Baseline INR, aPTT: pending  Prior anticoagulation: none, ASA only  Significant events:  Today, 08/20/2017:  CBC: hgb low but stable from yesterday, Plt WNL  No bleeding or infusion issues per nursing  CrCl: 38 ml/min  Goal of Therapy: Heparin level 0.3-0.7 units/ml Monitor platelets by anticoagulation protocol: Yes  Plan:  Heparin 4000 units IV bolus x 1  Heparin 850 units/hr IV infusion  Check heparin level 8 hrs after start  Daily CBC, daily heparin level once stable  Monitor for signs of bleeding or thrombosis   Reuel Boom, PharmD, BCPS [phone] 08/20/2017, 8:18 AM

## 2017-08-20 NOTE — Progress Notes (Signed)
  Echocardiogram 2D Echocardiogram has been performed.  [name] 08/20/2017, 9:31 AM

## 2017-08-20 NOTE — Progress Notes (Signed)
Hypoglycemic Event  CBG: 53  Treatment: orange juice and peaches  Symptoms: none  Follow-up CBG: Time:1709 CBG Result:115  Possible Reasons for Event: poor appetite  Comments/MD notified: MD notified via text page    Clarene Critchley

## 2017-08-20 NOTE — Evaluation (Signed)
Physical Therapy Evaluation Patient Details Name: [name] MRN: [id number] DOB: 12-Feb-1933 Today's Date: 08/20/2017   History of Present Illness  [name] is a 84 year old female with medical history significant for CAD status post MI, CVA during cardiac cath, hypertension, hyperlipidemia, breast cancer , DM type II, presents to the ED 08/19/17 complaining of nausea/vomiting, watery diarrhea, abdominal pain, shortness of breath/orthopnea, hypoxia in ED.  Clinical Impression  The patient is very weak and states not feeling well. Patient is home alone during day.  Oxygen saturation to 88% on RA during mobility. Pt admitted with above diagnosis. Pt currently with functional limitations due to the deficits listed below (see PT Problem List). Pt will benefit from skilled PT to increase their independence and safety with mobility to allow discharge to the venue listed below.       Follow Up Recommendations SNF    Equipment Recommendations  None recommended by PT    Recommendations for Other Services       Precautions / Restrictions Precautions Precaution Comments: monitor saturation      Mobility  Bed Mobility Overal bed mobility: Needs Assistance Bed Mobility: Supine to Sit;Sit to Supine     Supine to sit: Min assist Sit to supine: Min assist      Transfers Overall transfer level: Needs assistance Equipment used: Rolling walker (2 wheeled) Transfers: Sit to/from Stand Sit to Stand: Mod assist;+2 safety/equipment         General transfer comment: steady assist to stand at [facility] and to side step along bed.   Ambulation/Gait             General Gait Details: NT  Stairs            Wheelchair Mobility    Modified Rankin (Stroke Patients Only)       Balance                                             Pertinent Vitals/Pain Pain Assessment: No/denies pain    Home Living Family/patient expects to be discharged to:: Private  residence Living Arrangements: Other relatives Available Help at Discharge: Family Type of Home: House Home Access: Level entry     Home Layout: One level Home Equipment: Environmental consultant - 4 wheels      Prior Function Level of Independence: Independent               Hand Dominance        Extremity/Trunk Assessment   Upper Extremity Assessment Upper Extremity Assessment: Generalized weakness    Lower Extremity Assessment Lower Extremity Assessment: Generalized weakness    Cervical / Trunk Assessment Cervical / Trunk Assessment: Normal  Communication   Communication: No difficulties  Cognition Arousal/Alertness: Awake/alert Behavior During Therapy: WFL for tasks assessed/performed Overall Cognitive Status: Within Functional Limits for tasks assessed                                        General Comments      Exercises     Assessment/Plan    PT Assessment Patient needs continued PT services  PT Problem List Decreased strength;Decreased activity tolerance;Decreased mobility;Cardiopulmonary status limiting activity       PT Treatment Interventions DME instruction;Gait training;Functional mobility training;Therapeutic activities;Therapeutic exercise;Patient/family education  PT Goals (Current goals can be found in the Care Plan section)  Acute Rehab PT Goals Patient Stated Goal: to go home tomorrow. PT Goal Formulation: With patient Time For Goal Achievement: 09/03/17 Potential to Achieve Goals: Fair    Frequency Min 2X/week   Barriers to discharge Decreased caregiver support grandson works    Co-evaluation               AM-PAC PT "6 Clicks" Daily Activity  Outcome Measure Difficulty turning over in bed (including adjusting bedclothes, sheets and blankets)?: A Lot Difficulty moving from lying on back to sitting on the side of the bed? : A Lot Difficulty sitting down on and standing up from a chair with arms (e.g., wheelchair,  bedside commode, etc,.)?: Unable Help needed moving to and from a bed to chair (including a wheelchair)?: Total Help needed walking in hospital room?: Total Help needed climbing 3-5 steps with a railing? : Total 6 Click Score: 8    End of Session   Activity Tolerance: Patient limited by fatigue Patient left: in bed;with call bell/phone within reach;with bed alarm set Nurse Communication: Mobility status PT Visit Diagnosis: Unsteadiness on feet (R26.81)    Time: 9169-4503 PT Time Calculation (min) (ACUTE ONLY): 17 min   Charges:   PT Evaluation $PT Eval Low Complexity: 1 Low     PT G CodesTresa Endo PT [phone]   Claretha Cooper 08/20/2017, 2:51 PM

## 2017-08-20 NOTE — Progress Notes (Addendum)
Blood sugar 53, 67 on rechecked. Pt alert and oriented in bed. Grandson at bedside states that this happens frequently at home, pt sometimes drops as low as 30's due to pt's poor appetite. Given orange juice, fed peaches will recheck blood sugar in 15 minutes.

## 2017-08-20 NOTE — Progress Notes (Signed)
Hypoglycemic Event  CBG: 53  Treatment: 4 oz orange juice  Symptoms: nausea Follow-up CBG: Time:1700 CBG Result:115  Possible Reasons for Event: Decreased appetite  Comments/MD notified: Yes    Karsin Pesta S Jagdeep Ancheta

## 2017-08-20 NOTE — Consult Note (Addendum)
Cardiology Consultation:   Patient ID: [name]; [id number]; [birth year]   Admit date: 08/19/2017 Date of Consult: 08/20/2017  Primary Care Provider: Burnard Bunting, MD Primary Cardiologist: Shirlee More, MD  Primary Electrophysiologist:  NA   Patient Profile:   [name] is a 84 y.o. female with a hx of CAD, HTN, HLD, and recent malignant neoplasm of Lt breast who is being seen today for the evaluation of elevated troponin on admit for weakness, nausea, abd pain diarrhea  at the request of Dr. Horris Latino.  History of Present Illness:   [name] has a hx of CAD, HTN, HLD, and recent malignant neoplasm of Lt breast.  Last cath was 2006 for chest pain:  LM was very short, LAD with 80% eccentric narrowing prior to the takeoff of the diagonal.  The diagonal is relatively small in caliber  being just over a 2 mm vessel and has itself an 80% stenosis.  The distal LAD has some segmental 30-40% narrowing in its mid portion after the diagonal takeoff.  LCX with 2 marginal branches patent, RCA with 40% area of proximal narrowing terminating as a PDA and PLA.  She then had CVA on table -embolic Rt medullary Rt frontal infarction, and it was decided to treat CAD medically nuc study in 2009 was normal perfusion, EF 81%.      Last saw Dr. Bettina Glover 05/2017 and evaluated for L breast lumpectomy and felt to be intermediate risk.    Now admitted 08/19/17 with N/V, abdominal pain and diarrhea, SOB.  3 weeks prior to admit she began having exertional dyspnea and rapid HR at times.    She also began having lower ext edema..  She does not overeat salt, does not use much.  1 week ago she developed erythema at lumpectomy site and treated with ABX. She had just completed ABX.  She developed vomiting X 4, and watery diarrhea.  No chest pain.    In ER    EKG  I personally reviewed  ST at 100 with ST depression lateral leads increased from EKG on 06/25/17 small Q waves in III  Today EKG SR ST depression  improved slightly and Q waves continue. TELE I personally reviewed SR with freq PACs to ST.  Interestingly EKG from 06/20/2011 with sever T wave inversions inf lat. --this was when her husband died per daughter.  BNP 409  Troponin 0.05; 0.08; 0.09; 0.09 all flat  Cr 1.29, Na 136, K+ 4.2 Hgb 9.4 WBC was 14 on admit now 9.7 TSH 1.988  CXR Stable cardiomegaly with central pulmonary vascular congestion.  Probable mild bibasilar pulmonary edema is noted with small pleural Effusions  Abd CT with large HH, no acute issues  IV heparin has been started,  VS elevated on admit 192/151 now 170/110  Afebrile,   Echo has been ordered, done not yet read.   Currently no chest pain, nausea, SOB or diarrhea.  She is very tired.  Not much rest.     Past Medical History:  Diagnosis Date  . Altered mental status 04/26/2011  . ANEMIA 03/07/2009   Qualifier: Diagnosis of  By: Burnett Kanaris    . Arthritis   . Basal cell carcinoma of skin of eyelid, including canthus 10/08/2012  . Breast cancer ([place]) 06/24/2017  . Coronary artery disease   . Depression    after passing of husband   . Diabetes mellitus   . Diverticulosis   . Dyslipidemia 09/23/2012  . GERD (gastroesophageal reflux disease)   .  Gout   . Gout   . History of myocardial infarction 09/23/2012  . Hypertension   . IBS (irritable bowel syndrome)   . Malignant neoplasm of upper-outer quadrant of left breast in female, estrogen receptor positive ([place]) 05/22/2017  . Stroke [facility])     Past Surgical History:  Procedure Laterality Date  . ABDOMINAL HYSTERECTOMY    . APPENDECTOMY    . BACK SURGERY    . BREAST LUMPECTOMY WITH RADIOACTIVE SEED AND SENTINEL LYMPH NODE BIOPSY Left 07/08/2017   Procedure: LEFT BREAST LUMPECTOMY WITH RADIOACTIVE SEED AND SENTINEL LYMPH NODE BIOPSY;  Surgeon: Fanny Skates, MD;  Location: [facility];  Service: General;  Laterality: Left;  . cataract surgery    . COLONOSCOPY    . skin cancer removed        Home  Medications:  Prior to Admission medications   Medication Sig Start Date End Date Taking? Authorizing Provider  escitalopram (LEXAPRO) 10 MG tablet Take 10 mg by mouth daily.  07/28/12  Yes [provider]  HYDROcodone-acetaminophen (NORCO) 10-325 MG per tablet Take 1 tablet by mouth every 4 (four) hours as needed. For pain.   Yes [provider]  insulin aspart (NOVOLOG) 100 UNIT/ML injection Inject into the skin 3 (three) times daily before meals. Sliding scale   Yes [provider]  insulin detemir (LEVEMIR) 100 UNIT/ML injection Inject 6 Units into the skin 2 (two) times daily.   Yes [provider]  omeprazole (PRILOSEC) 20 MG capsule Take 20 mg by mouth daily.    Yes [provider]  simvastatin (ZOCOR) 40 MG tablet Take 1 tablet (40 mg total) by mouth at bedtime. 06/25/17  Yes Richardo Priest, MD  sulfamethoxazole-trimethoprim (BACTRIM,SEPTRA) 400-80 MG tablet Take 1 tablet by mouth 2 (two) times daily. 08/13/17  Yes [provider]  tamoxifen (NOLVADEX) 20 MG tablet Take 1 tablet (20 mg total) by mouth daily. 08/02/17  Yes Nicholas Lose, MD  telmisartan (MICARDIS) 20 MG tablet Take 1 tablet (20 mg total) by mouth daily. 06/28/17  Yes Richardo Priest, MD  temazepam (RESTORIL) 15 MG capsule Take 15 mg by mouth at bedtime as needed. For sleep.   Yes [provider]  aspirin EC 81 MG tablet Take 81 mg by mouth daily.    [provider]    Inpatient Medications: Scheduled Meds: . aspirin EC  81 mg Oral Daily  . escitalopram  10 mg Oral Daily  . furosemide  40 mg Intravenous BID  . insulin aspart  0-9 Units Subcutaneous TID WC  . insulin aspart  3 Units Subcutaneous TID AC  . insulin detemir  6 Units Subcutaneous BID  . pantoprazole  40 mg Oral Daily  . simvastatin  40 mg Oral QHS   Continuous Infusions: . heparin Stopped (08/20/17 0941)   PRN Meds: acetaminophen **OR** acetaminophen, hydrALAZINE,  HYDROcodone-acetaminophen, ondansetron **OR** ondansetron (ZOFRAN) IV, temazepam  Allergies:    Allergies  Allergen Reactions  . Cephalexin Hives  . Ivp Dye [Iodinated Diagnostic Agents] Hives    Social History:   Social History   Socioeconomic History  . Marital status: Married    Spouse name: Not on file  . Number of children: Not on file  . Years of education: Not on file  . Highest education level: Not on file  Occupational History  . Not on file  Social Needs  . Financial resource strain: Not on file  . Food insecurity:    Worry: Not on file  Inability: Not on file  . Transportation needs:    Medical: Not on file    Non-medical: Not on file  Tobacco Use  . Smoking status: Never Smoker  . Smokeless tobacco: Never Used  Substance and Sexual Activity  . Alcohol use: No    Comment: Unable to ascertain due to altered mental status.  . Drug use: No  . Sexual activity: Not on file  Lifestyle  . Physical activity:    Days per week: Not on file    Minutes per session: Not on file  . Stress: Not on file  Relationships  . Social connections:    Talks on phone: Not on file    Gets together: Not on file    Attends religious service: Not on file    Active member of club or organization: Not on file    Attends meetings of clubs or organizations: Not on file    Relationship status: Not on file  . Intimate partner violence:    Fear of current or ex partner: Not on file    Emotionally abused: Not on file    Physically abused: Not on file    Forced sexual activity: Not on file  Other Topics Concern  . Not on file  Social History Narrative  . Not on file    Family History:    Family History  Problem Relation Age of Onset  . Breast cancer Daughter 39  . Cancer Mother   . Cancer Father      ROS:  Please see the history of present illness.  General:no colds or fevers, no weight changes Skin:no rashes or ulcers HEENT:no blurred vision, + congestion at back of  her throat and is up nights coughing a lot.  CV:see HPI PUL:see HPI GI:+ diarrhea no constipation no melena, no indigestion GU:no hematuria, no dysuria MS:no joint pain, no claudication Neuro:no syncope, no lightheadedness Endo:+ diabetes, no thyroid disease   All other ROS reviewed and negative.     Physical Exam/Data:   Vitals:   08/20/17 0400 08/20/17 0500 08/20/17 0600 08/20/17 0753  BP:   (!) 157/54   Pulse: 93 95 99 92  Resp: 14 (!) 21 16 17   Temp:    98.2 F (36.8 C)  TempSrc:    Oral  SpO2: 98% 98% 94% 93%  Weight:      Height:        Intake/Output Summary (Last 24 hours) at 08/20/2017 0943 Last data filed at 08/20/2017 0757 Gross per 24 hour  Intake 2180 ml  Output 3550 ml  Net -1370 ml   Filed Weights   08/19/17 0802  Weight: 152 lb (68.9 kg)   Body mass index is 25.29 kg/m.  General:  Well nourished, though currently frail in appearance, in no acute distress but with fatigue HEENT: normal Lymph: no adenopathy Neck: mild JVD Endocrine:  No thryomegaly Vascular: No carotid bruits; pedal pulses 1+ bilaterally   Cardiac:  normal S1, S2; RRR; no murmur gallup rub or click Lungs:  clear to auscultation bilaterally, no wheezing, rhonchi or rales though somewhat diminished Abd: soft, nontender, no hepatomegaly  Ext: no edema Musculoskeletal:  No deformities, BUE and BLE strength normal and equal Skin: warm and dry  Neuro:  Alert and oriented X 3 MAE follows commands, no focal abnormalities noted Psych:  Normal affect    Relevant CV Studies: Echo pending  Laboratory Data:  Chemistry Recent Labs  Lab 08/19/17 0933  NA 139  K 4.9  CL 109  CO2 21*  GLUCOSE 257*  BUN 16  CREATININE 1.12*  CALCIUM 8.8*  GFRNONAA 44*  GFRAA 52*  ANIONGAP 9    Recent Labs  Lab 08/19/17 0933  PROT 7.0  ALBUMIN 3.8  AST 26  ALT 12*  ALKPHOS 141*  BILITOT 0.6   Hematology Recent Labs  Lab 08/19/17 0933 08/20/17 0654  WBC 14.7* 9.7  RBC 3.13* 3.23*    HGB 9.2* 9.4*  HCT 29.5* 30.1*  MCV 94.2 93.2  MCH 29.4 29.1  MCHC 31.2 31.2  RDW 14.2 14.2  PLT 394 382   Cardiac Enzymes Recent Labs  Lab 08/19/17 1230 08/19/17 1816 08/20/17 0020 08/20/17 0654  TROPONINI 0.05* 0.08* 0.09* 0.09*   No results for input(s): TROPIPOC in the last 168 hours.  BNP Recent Labs  Lab 08/19/17 0933  BNP 409.6*    DDimer No results for input(s): DDIMER in the last 168 hours.  Radiology/Studies:  Ct Abdomen Pelvis Wo Contrast  Result Date: 08/19/2017 CLINICAL DATA:  Acute abdominal pain. Nausea, vomiting, and diarrhea. History of breast cancer with lumpectomy July 08, 2017. EXAM: CT ABDOMEN AND PELVIS WITHOUT CONTRAST TECHNIQUE: Multidetector CT imaging of the abdomen and pelvis was performed following the standard protocol without IV contrast. COMPARISON:  April 27, 2011 FINDINGS: Lower chest: Skin thickening in the inferior left breast, incompletely evaluated. Small to moderate bilateral pleural effusions. Large hiatal hernia. Mild cardiomegaly. A calcified nodule in the lingula is of no significance. Opacity under the bilateral effusions is likely atelectasis. No suspicious infiltrate identified. Hepatobiliary: No focal liver abnormality is seen. No gallstones, gallbladder wall thickening, or biliary dilatation. Pancreas: Unremarkable. No pancreatic ductal dilatation or surrounding inflammatory changes. Spleen: Normal in size without focal abnormality. Adrenals/Urinary Tract: Adrenal glands are normal. No renal stones or hydronephrosis. No suspicious masses. The ureters and bladder are normal. Stomach/Bowel: Other than the hiatal hernia, the stomach is normal. The small bowel is normal. Colonic diverticulosis is seen without diverticulitis. The colon is normal. The appendix is been removed by report. Vascular/Lymphatic: Atherosclerotic changes are seen in the nonaneurysmal tortuous aorta and iliac vessels. No adenopathy. Reproductive: Status post  hysterectomy. No adnexal masses. Other: No free air or free fluid. Increased attenuation in the subcutaneous fat diffusely suggests volume overload. Musculoskeletal: Evaluation of the left hip and pelvic bones is limited due to motion. Cortical malalignment in the left pelvic bones on series 4, image 47 is thought to be artifactual due to motion. Recommend clinical correlation. Grade 1 anterolisthesis of L4 versus L5. No fractures or traumatic malalignment. IMPRESSION: 1. No cause for the patient's abdominal symptoms identified. 2. Skin thickening in the inferior left breast is incompletely evaluated. This could be due to recent surgery or radiation. Recommend clinical correlation. Given history, if there is concern for a breast infection, ultrasound could better evaluate. 3. Large hiatal hernia. 4. Mild cardiomegaly, pleural effusions, and subcutaneous fat edema suggests volume overload. 5. Colonic diverticulosis without diverticulitis. 6. Atherosclerotic changes in the aorta and iliac vessels. Electronically Signed   By: Dorise Bullion III M.D   On: 08/19/2017 15:23   Dg Chest Port 1 View  Result Date: 08/20/2017 CLINICAL DATA:  Shortness of breath, chest pain. EXAM: PORTABLE CHEST 1 VIEW COMPARISON:  Radiographs Aug 19, 2017. FINDINGS: Stable cardiomegaly with central pulmonary vascular congestion. Bibasilar interstitial densities are noted concerning for edema and possible mild pleural effusions. No pneumothorax is noted. Bony thorax is unremarkable. IMPRESSION: Stable cardiomegaly with central pulmonary vascular congestion. Probable  mild bibasilar pulmonary edema is noted with small pleural effusions. Electronically Signed   By: Marijo Conception, M.D.   On: 08/20/2017 08:42   Dg Abdomen Acute W/chest  Result Date: 08/19/2017 CLINICAL DATA:  Wheezing, congestion. EXAM: DG ABDOMEN ACUTE W/ 1V CHEST COMPARISON:  Radiographs of June 20, 2011. FINDINGS: Mild cardiomegaly is noted with central pulmonary  vascular congestion. Probable mild diffuse bilateral pulmonary edema is noted. No pneumothorax is noted. No significant pleural effusion is noted. No free air is noted to suggest pneumoperitoneum. No abnormal bowel gas pattern is noted. No abnormal calcifications are noted. IMPRESSION: No evidence of bowel obstruction or ileus. Mild cardiomegaly is noted with mild central pulmonary vascular congestion and probable bilateral pulmonary edema. Electronically Signed   By: Marijo Conception, M.D.   On: 08/19/2017 09:29    Assessment and Plan:   1. Elevated troponin -flat and no chest pain, no acute changes in EKG though she does have CAD, may be demand ischemia with her abd pain, N/V/Diarrhea.   Echo has been done but not read.   Will be helpful.  Is on IV heparin.   Dr. Burt Knack to see.    Continue ASA, on Zocor  2. CAD with 80% LAD disease in 2006 but with CVA on table no PCI was done.  Has been managed medically.  May need nuc study in future, but would let her recover and depending on echo. 3. Abd. Pain, N/V per IM, CT abd without acute process, possible viral  C diff quick scan pending  4. Tachycardia ST to SR with freq PACs at times ? A fib.  Has a lot of artifact making it more difficult to tell. Is on heparin currently continue to monitor.    Previously had been on BB will resume -was taking in 05/2017  Will resume lopressor 25 bid 5.         CHF treated with IV lasix on 40 mg BID - edema resolved and SOB improved  Neg 1370 6.         Anemia Hgb at 9.2 in 07/2017 was 10.3  7.         AKI with Cr 1.12 when 1 month ago 0.91 most likely due to N/V/D.  Pt had been on ArB holding for now  8.          Hx CVA on cath table in 2006    For questions or updates, please contact [place] Please consult [email or web address] for contact info under Cardiology/STEMI.   Signed, Cecilie Kicks, NP  08/20/2017 9:43 AM  Patient seen, examined. Available data reviewed. Agree with findings, assessment, and plan as outlined  by Cecilie Kicks, NP.   On my exam this morning: Vitals:   08/20/17 1111 08/20/17 1148  BP: (!) 180/86 (!) 149/69  Pulse:  (!) 107  Resp:  20  Temp:  98.7 F (37.1 C)  SpO2:  97%   Pt is alert and oriented, Pleasant elderly woman in NAD HEENT: normal Neck: JVP -mildly elevated, carotids 2+= without bruits Lungs: Bibasilar rales, otherwise clear CV: RRR without murmur or gallop Abd: soft, NT, Positive BS, no hepatomegaly Ext:  Trace pretibial edema bilaterally , distal pulses intact and equal Skin: warm/dry no rash  EKG is reviewed and demonstrates sinus rhythm with no significant ST/T change compared to baseline.  The patient describes typical symptoms of congestive heart failure exacerbation preceding her hospitalization.  She also has multiple other medical issues and presents with  symptoms that are clearly noncardiac in nature with nausea, vomiting, and abdominal pain.  Her cardiac history is reviewed and she has a history of coronary artery disease managed medically now with stable symptoms for many years.  She does not have any anginal symptoms.  Her troponin elevation is minimal.  I do not think this appears to be an ACS presentation and IV heparin could be discontinued.  It would be reasonable to treat her with DVT prophylaxis dose heparin.  Her echocardiogram is reviewed and it demonstrates mild segmental LV systolic dysfunction with an LVEF of 45 to 50%.  Clinical findings today, I agree with continuing IV Lasix and reassess renal function tomorrow morning.  She is tolerating a beta-blocker, statin drug, and aspirin.  No other changes recommended today.  Will follow with you.  Sherren Mocha, M.D. 08/20/2017 11:53 AM

## 2017-08-20 NOTE — Progress Notes (Signed)
PROGRESS NOTE  [name] HQI:[id number] DOB: [birth year] DOA: 08/19/2017 PCP: Burnard Bunting, MD  HPI/Recap of past 24 hours: [name] is a 84 year old female with medical history significant for CAD status post MI, CVA during cardiac cath, hypertension, hyperlipidemia, breast cancer S/P left lumpectomy in 07/2017, DM type II, presents to the ED complaining of nausea/vomiting, watery diarrhea, abdominal pain, shortness of breath/orthopnea/PND, lower extremity edema for the past 2 weeks.  Status post lumpectomy, patient developed presumed mastitis and was started on Bactrim.  Due to the persistent nausea/vomiting, patient unable to keep anything down.  Denied any fever/chills.  In the ED, patient noted to be requiring 4L [place], chest x-ray and CT done showed bilateral pleural effusions, concern for vascular congestion.  CT abdomen pelvis showed no acute intra-abdominal process.  Patient was given IV fluids and admitted for further management.  Today, patient reported some chest pressure, SOB, still nauseated, with some generalized abdominal pain. Cardiology consulted  Assessment/Plan: Active Problems:   Hyperlipidemia   Coronary artery disease involving native coronary artery of native heart with angina pectoris ([place])   Cerebral artery occlusion with cerebral infarction [facility])   Malignant neoplasm of upper-outer quadrant of left breast in female, estrogen receptor positive ([place])   H/O: CVA (cerebrovascular accident)   History of myocardial infarction   Diabetes mellitus ([place])   HTN (hypertension)   Acute hypoxemic respiratory failure (HCC)   Nausea and vomiting  Acute on chronic systolic heart failure BNP 409.6 Chest x-ray showed pulmonary vascular congestion Echo showed EF of 45% to 50%, PA peak pressure of 57 mmHg Cardiology on board Continue IV Lasix Strict I's and O's, daily weights  Acute hypoxic respiratory failure Likely due to above, noted Pulm HTN Continue  supplemental oxygen Management as above  Acute gastroenteritis ??viral or AB induced Vs C.diff Afebrile, no leukocytosis GI panel, C. difficile PCR pending collection CT abdomen pelvis with no acute changes History of recent Bactrim use Monitor closely, as patient cannot receive IV fluid in the moment  AKI Likely due to above Monitor closely as patient is on Lasix Daily BMP  History of CAD/elevated troponin As per chart review, had a CVA events during cardiac cath, no interventions done Flat trend, EKG with no acute ST changes Cardiology consulted, unlikely ACS, stop heparin Continue ASA, statins, BB Monitor closely  History of mastitis Currently, no evidence of infection Monitor closely  Anemia of chronic disease Stable at baseline  Hypertension/Hyperlipidemia Continue Lopressor, hydralazine as needed  Type 2 diabetes mellitus Continue SSI, NovoLog 3 times daily, Levemir twice daily Accu-Cheks  Left breast cancer On tamoxifen  Depression/anxiety Continue escitalopram, temazepam    Code Status: DNR  Family Communication: None at bedside  Disposition Plan: Home once stable   Consultants:  Cardiology  Procedures:  None  Antimicrobials:  None  DVT prophylaxis: Lovenox   Objective: Vitals:   08/20/17 0951 08/20/17 1111 08/20/17 1148 08/20/17 1221  BP: (!) 170/110 (!) 180/86 (!) 149/69 (!) 165/71  Pulse: (!) 104  (!) 107 97  Resp: 14  20   Temp: 98.7 F (37.1 C)  98.7 F (37.1 C)   TempSrc: Oral  Oral   SpO2: 98%  97%   Weight:      Height:        Intake/Output Summary (Last 24 hours) at 08/20/2017 1308 Last data filed at 08/20/2017 1152 Gross per 24 hour  Intake 1195.16 ml  Output 5140 ml  Net -3944.84 ml   [employer]  08/19/17 0802  Weight: 68.9 kg (152 lb)    Exam:   General: In mild distress, ill-appearing  Cardiovascular: S1, S2 present  Respiratory: Bibasilar crackles, no wheezing heard  Abdomen: Soft, nontender  on palpation, nondistended, bowel sounds present  Musculoskeletal: Trace bilateral lower extremity edema  Skin: Normal  Psychiatry: Normal mood   Data Reviewed: CBC: Recent Labs  Lab 08/19/17 0933 08/20/17 0654  WBC 14.7* 9.7  HGB 9.2* 9.4*  HCT 29.5* 30.1*  MCV 94.2 93.2  PLT 394 269   Basic Metabolic Panel: Recent Labs  Lab 08/19/17 0933 08/20/17 0654  NA 139 136  K 4.9 4.2  CL 109 101  CO2 21* 21*  GLUCOSE 257* 132*  BUN 16 18  CREATININE 1.12* 1.29*  CALCIUM 8.8* 8.9   GFR: Estimated Creatinine Clearance: 32.8 mL/min (A) (by C-G formula based on SCr of 1.29 mg/dL (H)). Liver Function Tests: Recent Labs  Lab 08/19/17 0933  AST 26  ALT 12*  ALKPHOS 141*  BILITOT 0.6  PROT 7.0  ALBUMIN 3.8   Recent Labs  Lab 08/19/17 0933  LIPASE 17   No results for input(s): AMMONIA in the last 168 hours. Coagulation Profile: Recent Labs  Lab 08/20/17 0956  INR 1.18   Cardiac Enzymes: Recent Labs  Lab 08/19/17 1230 08/19/17 1816 08/20/17 0020 08/20/17 0654  TROPONINI 0.05* 0.08* 0.09* 0.09*   BNP (last 3 results) No results for input(s): PROBNP in the last 8760 hours. HbA1C: No results for input(s): HGBA1C in the last 72 hours. CBG: Recent Labs  Lab 08/19/17 1444 08/19/17 2103 08/20/17 0546 08/20/17 0750 08/20/17 1145  GLUCAP 237* 265* 118* 135* 215*   Lipid Profile: No results for input(s): CHOL, HDL, LDLCALC, TRIG, CHOLHDL, LDLDIRECT in the last 72 hours. Thyroid Function Tests: Recent Labs    08/19/17 1816  TSH 1.988   Anemia Panel: No results for input(s): VITAMINB12, FOLATE, FERRITIN, TIBC, IRON, RETICCTPCT in the last 72 hours. Urine analysis:    Component Value Date/Time   COLORURINE YELLOW 08/19/2017 0933   APPEARANCEUR CLEAR 08/19/2017 0933   LABSPEC 1.017 08/19/2017 0933   PHURINE 5.0 08/19/2017 0933   GLUCOSEU NEGATIVE 08/19/2017 0933   HGBUR SMALL (A) 08/19/2017 0933   BILIRUBINUR NEGATIVE 08/19/2017 0933   KETONESUR  5 (A) 08/19/2017 0933   PROTEINUR NEGATIVE 08/19/2017 0933   UROBILINOGEN 0.2 11/25/2012 2032   NITRITE NEGATIVE 08/19/2017 0933   LEUKOCYTESUR TRACE (A) 08/19/2017 0933   Sepsis Labs: @LABRCNTIP (procalcitonin:4,lacticidven:4)  ) Recent Results (from the past 240 hour(s))  MRSA PCR Screening     Status: None   Collection Time: 08/19/17 10:48 PM  Result Value Ref Range Status   MRSA by PCR NEGATIVE NEGATIVE Final    Comment:        The GeneXpert MRSA Assay (FDA approved for NASAL specimens only), is one component of a comprehensive MRSA colonization surveillance program. It is not intended to diagnose MRSA infection nor to guide or monitor treatment for MRSA infections. Performed at [facility], [place] [address] [place], [place] [address]       Studies: Ct Abdomen Pelvis Wo Contrast  Result Date: 08/19/2017 CLINICAL DATA:  Acute abdominal pain. Nausea, vomiting, and diarrhea. History of breast cancer with lumpectomy July 08, 2017. EXAM: CT ABDOMEN AND PELVIS WITHOUT CONTRAST TECHNIQUE: Multidetector CT imaging of the abdomen and pelvis was performed following the standard protocol without IV contrast. COMPARISON:  April 27, 2011 FINDINGS: Lower chest: Skin thickening in the inferior left  breast, incompletely evaluated. Small to moderate bilateral pleural effusions. Large hiatal hernia. Mild cardiomegaly. A calcified nodule in the lingula is of no significance. Opacity under the bilateral effusions is likely atelectasis. No suspicious infiltrate identified. Hepatobiliary: No focal liver abnormality is seen. No gallstones, gallbladder wall thickening, or biliary dilatation. Pancreas: Unremarkable. No pancreatic ductal dilatation or surrounding inflammatory changes. Spleen: Normal in size without focal abnormality. Adrenals/Urinary Tract: Adrenal glands are normal. No renal stones or hydronephrosis. No suspicious masses. The ureters and bladder are normal.  Stomach/Bowel: Other than the hiatal hernia, the stomach is normal. The small bowel is normal. Colonic diverticulosis is seen without diverticulitis. The colon is normal. The appendix is been removed by report. Vascular/Lymphatic: Atherosclerotic changes are seen in the nonaneurysmal tortuous aorta and iliac vessels. No adenopathy. Reproductive: Status post hysterectomy. No adnexal masses. Other: No free air or free fluid. Increased attenuation in the subcutaneous fat diffusely suggests volume overload. Musculoskeletal: Evaluation of the left hip and pelvic bones is limited due to motion. Cortical malalignment in the left pelvic bones on series 4, image 47 is thought to be artifactual due to motion. Recommend clinical correlation. Grade 1 anterolisthesis of L4 versus L5. No fractures or traumatic malalignment. IMPRESSION: 1. No cause for the patient's abdominal symptoms identified. 2. Skin thickening in the inferior left breast is incompletely evaluated. This could be due to recent surgery or radiation. Recommend clinical correlation. Given history, if there is concern for a breast infection, ultrasound could better evaluate. 3. Large hiatal hernia. 4. Mild cardiomegaly, pleural effusions, and subcutaneous fat edema suggests volume overload. 5. Colonic diverticulosis without diverticulitis. 6. Atherosclerotic changes in the aorta and iliac vessels. Electronically Signed   By: Dorise Bullion III M.D   On: 08/19/2017 15:23   Dg Chest Port 1 View  Result Date: 08/20/2017 CLINICAL DATA:  Shortness of breath, chest pain. EXAM: PORTABLE CHEST 1 VIEW COMPARISON:  Radiographs Aug 19, 2017. FINDINGS: Stable cardiomegaly with central pulmonary vascular congestion. Bibasilar interstitial densities are noted concerning for edema and possible mild pleural effusions. No pneumothorax is noted. Bony thorax is unremarkable. IMPRESSION: Stable cardiomegaly with central pulmonary vascular congestion. Probable mild bibasilar  pulmonary edema is noted with small pleural effusions. Electronically Signed   By: Marijo Conception, M.D.   On: 08/20/2017 08:42    Scheduled Meds: . aspirin EC  81 mg Oral Daily  . escitalopram  10 mg Oral Daily  . furosemide  40 mg Intravenous BID  . insulin aspart  0-9 Units Subcutaneous TID WC  . insulin aspart  3 Units Subcutaneous TID AC  . insulin detemir  6 Units Subcutaneous BID  . mouth rinse  15 mL Mouth Rinse BID  . metoprolol tartrate  25 mg Oral BID  . pantoprazole  40 mg Oral Daily  . simvastatin  40 mg Oral QHS    Continuous Infusions:   LOS: 1 day     Alma Friendly, MD [facility]  If 7PM-7AM, please contact night-coverage [email or web address] Password [email or web address] 08/20/2017, 1:08 PM

## 2017-08-21 ENCOUNTER — Other Ambulatory Visit: Payer: Self-pay | Admitting: Cardiology

## 2017-08-21 DIAGNOSIS — I5041 Acute combined systolic (congestive) and diastolic (congestive) heart failure: Secondary | ICD-10-CM

## 2017-08-21 DIAGNOSIS — I5032 Chronic diastolic (congestive) heart failure: Secondary | ICD-10-CM

## 2017-08-21 LAB — GLUCOSE, CAPILLARY
Glucose-Capillary: 283 mg/dL — ABNORMAL HIGH (ref 65–99)
Glucose-Capillary: 173 mg/dL — ABNORMAL HIGH (ref 65–99)
Glucose-Capillary: 170 mg/dL — ABNORMAL HIGH (ref 65–99)
Glucose-Capillary: 148 mg/dL — ABNORMAL HIGH (ref 65–99)
Glucose-Capillary: 93 mg/dL (ref 65–99)
Glucose-Capillary: 271 mg/dL — ABNORMAL HIGH (ref 65–99)

## 2017-08-21 LAB — BASIC METABOLIC PANEL
Anion gap: 14 (ref 5–15)
BUN: 19 mg/dL (ref 6–20)
CHLORIDE: 94 mmol/L — AB (ref 101–111)
CO2: 26 mmol/L (ref 22–32)
Calcium: 8.6 mg/dL — ABNORMAL LOW (ref 8.9–10.3)
Creatinine, Ser: 1.25 mg/dL — ABNORMAL HIGH (ref 0.44–1.00)
GFR calc non Af Amer: 39 mL/min — ABNORMAL LOW (ref >60)
GFR, EST AFRICAN AMERICAN: 45 mL/min — AB (ref >60)
Glucose, Bld: 199 mg/dL — ABNORMAL HIGH (ref 65–99)
POTASSIUM: 4 mmol/L (ref 3.5–5.1)
SODIUM: 134 mmol/L — AB (ref 135–145)

## 2017-08-21 LAB — CBC WITH DIFFERENTIAL/PLATELET
BASOS PCT: 0 %
Basophils Absolute: 0 10*3/uL (ref 0.0–0.1)
EOS ABS: 0.1 10*3/uL (ref 0.0–0.7)
Eosinophils Relative: 1 %
HEMATOCRIT: 30.8 % — AB (ref 36.0–46.0)
HEMOGLOBIN: 9.8 g/dL — AB (ref 12.0–15.0)
Lymphocytes Relative: 9 %
Lymphs Abs: 1.1 10*3/uL (ref 0.7–4.0)
MCH: 28.9 pg (ref 26.0–34.0)
MCHC: 31.8 g/dL (ref 30.0–36.0)
MCV: 90.9 fL (ref 78.0–100.0)
MONOS PCT: 7 %
Monocytes Absolute: 0.9 10*3/uL (ref 0.1–1.0)
NEUTROS ABS: 9.9 10*3/uL — AB (ref 1.7–7.7)
NEUTROS PCT: 83 %
Platelets: 426 10*3/uL — ABNORMAL HIGH (ref 150–400)
RBC: 3.39 MIL/uL — AB (ref 3.87–5.11)
RDW: 14 % (ref 11.5–15.5)
WBC: 11.9 10*3/uL — AB (ref 4.0–10.5)

## 2017-08-21 LAB — TROPONIN I: TROPONIN I: 0.06 ng/mL — AB (ref <0.03)

## 2017-08-21 MED ORDER — AMIODARONE HCL IN DEXTROSE 360-4.14 MG/200ML-% IV SOLN
30.0000 mg/h | INTRAVENOUS | Status: DC
  Administered 2017-08-22 – 2017-08-24 (×6): 30 mg/h via INTRAVENOUS
  Filled 2017-08-21 (×7): qty 200

## 2017-08-21 MED ORDER — HALOPERIDOL LACTATE 5 MG/ML IJ SOLN
1.0000 mg | Freq: Once | INTRAMUSCULAR | Status: AC
  Administered 2017-08-21: 1 mg via INTRAMUSCULAR
  Filled 2017-08-21: qty 1

## 2017-08-21 MED ORDER — AMIODARONE IV BOLUS ONLY 150 MG/100ML: 150.0000 mg | Freq: Once | INTRAVENOUS | Status: DC

## 2017-08-21 MED ORDER — AMIODARONE HCL IN DEXTROSE 360-4.14 MG/200ML-% IV SOLN
INTRAVENOUS | Status: AC
Start: 2017-08-21 — End: 2017-08-22
  Filled 2017-08-21: qty 200

## 2017-08-21 MED ORDER — DILTIAZEM LOAD VIA INFUSION
10.0000 mg | Freq: Once | INTRAVENOUS | Status: AC
  Administered 2017-08-21: 10 mg via INTRAVENOUS
  Filled 2017-08-21: qty 10

## 2017-08-21 MED ORDER — HALOPERIDOL LACTATE 5 MG/ML IJ SOLN
1.0000 mg | Freq: Once | INTRAMUSCULAR | Status: DC
  Filled 2017-08-21: qty 1

## 2017-08-21 MED ORDER — AMIODARONE HCL IN DEXTROSE 360-4.14 MG/200ML-% IV SOLN
60.0000 mg/h | INTRAVENOUS | Status: AC
  Administered 2017-08-21 (×2): 60 mg/h via INTRAVENOUS

## 2017-08-21 MED ORDER — DILTIAZEM HCL-DEXTROSE 100-5 MG/100ML-% IV SOLN (PREMIX)
5.0000 mg/h | INTRAVENOUS | Status: DC
  Administered 2017-08-21: 5 mg/h via INTRAVENOUS
  Filled 2017-08-21 (×2): qty 100

## 2017-08-21 MED ORDER — AMIODARONE LOAD VIA INFUSION
150.0000 mg | Freq: Once | INTRAVENOUS | Status: AC
  Administered 2017-08-21: 150 mg via INTRAVENOUS
  Filled 2017-08-21: qty 83.34

## 2017-08-21 MED ORDER — FUROSEMIDE 40 MG PO TABS
40.0000 mg | ORAL_TABLET | Freq: Every day | ORAL | Status: DC
Start: 2017-08-22 — End: 2017-08-23
  Administered 2017-08-22 – 2017-08-23 (×2): 40 mg via ORAL
  Filled 2017-08-21 (×2): qty 1

## 2017-08-21 NOTE — NC FL2 (Signed)
[place] MEDICAID FL2 LEVEL OF CARE SCREENING TOOL     IDENTIFICATION  Patient Name: [name] Birthdate: 05-Oct-1932 Sex: female Admission Date (Current Location): 08/19/2017  [facility] and [place] Number:  Herbalist and Address:  [facility],  [place] [address], [place]      Provider Number: [id number]  Attending Physician Name and Address:  Shelly Coss, MD  Relative Name and Phone Number:       Current Level of Care: Hospital Recommended Level of Care: [place] Prior Approval Number:    Date Approved/Denied:   PASRR Number:    Discharge Plan: SNF    Current Diagnoses: Patient Active Problem List   Diagnosis Date Noted  . Acute hypoxemic respiratory failure ([place]) 08/19/2017  . Nausea and vomiting 08/19/2017  . Breast cancer ([place]) 06/24/2017  . Preoperative cardiovascular examination 06/24/2017  . Malignant neoplasm of upper-outer quadrant of left breast in female, estrogen receptor positive ([place]) 05/22/2017  . Basal cell carcinoma of skin of eyelid, including canthus 10/08/2012  . Dyslipidemia 09/23/2012  . H/O: CVA (cerebrovascular accident) 09/23/2012  . History of gastroesophageal reflux (GERD) 09/23/2012  . History of myocardial infarction 09/23/2012  . Diabetes mellitus ([place]) 09/23/2012  . HTN (hypertension) 09/23/2012  . Altered mental status 04/26/2011  . DM 03/07/2009  . Hyperlipidemia 03/07/2009  . ANEMIA 03/07/2009  . Essential hypertension 03/07/2009  . Coronary artery disease involving native coronary artery of native heart with angina pectoris ([place]) 03/07/2009  . Cerebral artery occlusion with cerebral infarction ([place]) 03/07/2009  . GERD 03/07/2009  . IRRITABLE BOWEL SYNDROME 03/07/2009    Orientation RESPIRATION BLADDER Height & Weight     Self, Time, Situation, Place  Normal Continent Weight: 143 lb 15.4 oz (65.3 kg) Height:  5\' 5"  (165.1 cm)  BEHAVIORAL SYMPTOMS/MOOD NEUROLOGICAL  BOWEL NUTRITION STATUS      Continent Diet(Heart Healthy )  AMBULATORY STATUS COMMUNICATION OF NEEDS Skin   Extensive Assist Verbally Normal                       Personal Care Assistance Level of Assistance  Bathing, Feeding, Dressing Bathing Assistance: Limited assistance Feeding assistance: Independent Dressing Assistance: Limited assistance     Functional Limitations Info  Sight, Hearing, Speech Sight Info: Adequate Hearing Info: Impaired Speech Info: Adequate    SPECIAL CARE FACTORS FREQUENCY  PT (By licensed PT), OT (By licensed OT)     PT Frequency: 5x/week OT Frequency: 5x/week            Contractures Contractures Info: Not present    Additional Factors Info  Code Status, Allergies, Insulin Sliding Scale Code Status Info: DNR Allergies Info: Allergies: Cephalexin, Ivp Dye Iodinated Diagnostic Agents   Insulin Sliding Scale Info: Yes       Current Medications (08/21/2017):  This is the current hospital active medication list Current Facility-Administered Medications  Medication Dose Route Frequency Provider Last Rate Last Dose  . acetaminophen (OFIRMEV) IV 1,000 mg  1,000 mg Intravenous Once PRN Barnet Glasgow, MD      . acetaminophen (TYLENOL) tablet 650 mg  650 mg Oral Q6H PRN Purohit, Shrey C, MD   650 mg at 08/19/17 2011   Or  . acetaminophen (TYLENOL) suppository 650 mg  650 mg Rectal Q6H PRN Purohit, Shrey C, MD      . aspirin EC tablet 81 mg  81 mg Oral Daily Purohit, Shrey C, MD   81 mg at  08/21/17 0903  . enoxaparin (LOVENOX) injection 40 mg  40 mg Subcutaneous Q24H Alma Friendly, MD   40 mg at 08/20/17 1740  . escitalopram (LEXAPRO) tablet 10 mg  10 mg Oral Daily Purohit, Shrey C, MD   10 mg at 08/21/17 0904  . furosemide (LASIX) injection 40 mg  40 mg Intravenous BID Purohit, Shrey C, MD   40 mg at 08/21/17 0810  . haloperidol lactate (HALDOL) injection 1 mg  1 mg Intramuscular Once Kirby-Graham, Karsten Fells, NP      . hydrALAZINE  (APRESOLINE) injection 5 mg  5 mg Intravenous Q4H PRN Gardiner Barefoot, NP   5 mg at 08/20/17 1111  . HYDROcodone-acetaminophen (NORCO) 10-325 MG per tablet 1 tablet  1 tablet Oral Q4H PRN Purohit, Konrad Dolores, MD   1 tablet at 08/21/17 1112  . HYDROcodone-acetaminophen (NORCO) 7.5-325 MG per tablet 1 tablet  1 tablet Oral Once PRN Barnet Glasgow, MD      . HYDROmorphone (DILAUDID) injection 0.25-0.5 mg  0.25-0.5 mg Intravenous Q5 min PRN Barnet Glasgow, MD      . insulin aspart (novoLOG) injection 0-9 Units  0-9 Units Subcutaneous TID WC Purohit, Konrad Dolores, MD   2 Units at 08/21/17 0810  . insulin aspart (novoLOG) injection 3 Units  3 Units Subcutaneous TID AC Purohit, Konrad Dolores, MD   3 Units at 08/21/17 [phone]  . insulin detemir (LEVEMIR) injection 6 Units  6 Units Subcutaneous BID Cristy Folks, MD   6 Units at 08/21/17 0905  . MEDLINE mouth rinse  15 mL Mouth Rinse BID Alma Friendly, MD   15 mL at 08/21/17 0917  . meperidine (DEMEROL) injection 6.25-12.5 mg  6.25-12.5 mg Intravenous Q5 min PRN Barnet Glasgow, MD      . metoprolol tartrate (LOPRESSOR) tablet 25 mg  25 mg Oral BID Isaiah Serge, NP   25 mg at 08/21/17 0904  . ondansetron (ZOFRAN) tablet 4 mg  4 mg Oral Q6H PRN Purohit, Shrey C, MD   4 mg at 08/20/17 0555   Or  . ondansetron (ZOFRAN) injection 4 mg  4 mg Intravenous Q6H PRN Purohit, Shrey C, MD   4 mg at 08/20/17 1220  . pantoprazole (PROTONIX) EC tablet 40 mg  40 mg Oral Daily Purohit, Shrey C, MD   40 mg at 08/21/17 0904  . promethazine (PHENERGAN) injection 6.25-12.5 mg  6.25-12.5 mg Intravenous Q15 min PRN Barnet Glasgow, MD      . simvastatin (ZOCOR) tablet 40 mg  40 mg Oral QHS Purohit, Shrey C, MD   40 mg at 08/20/17 2112  . temazepam (RESTORIL) capsule 15 mg  15 mg Oral QHS PRN Purohit, Konrad Dolores, MD         Discharge Medications: Please see discharge summary for a list of discharge medications.  Relevant Imaging Results:  Relevant Lab  Results:   Additional Information ION:[id number]  Lia Hopping, LCSW

## 2017-08-21 NOTE — Progress Notes (Signed)
Heart rate as high as 171 on cardiac monitor. EKG obtained, showing a-fib with RVR. Pt states that she cannot feel her heart racing, but that she does feel tired. MD notified. 10 mg bolus of IV cardizem ordered with continuous drip of 5mg /hr to follow. Bolus has been completed now, heart rate no higher than 130's now. Will continue to follow closely at this time.

## 2017-08-21 NOTE — Progress Notes (Addendum)
PROGRESS NOTE    [name]  PJK:[id number] DOB: 1932/11/24 DOA: 08/19/2017 PCP: Burnard Bunting, MD   Brief Narrative: [name] is a 84 year old female with medical history significant for CAD status post MI, CVA during cardiac cath, hypertension, hyperlipidemia, breast cancer S/P left lumpectomy in 07/2017, DM type II, presents to the ED complaining of nausea/vomiting, watery diarrhea, abdominal pain, shortness of breath/orthopnea/PND, lower extremity edema for the past 2 weeks.  Status post lumpectomy, patient developed presumed mastitis and was started on Bactrim.  Due to the persistent nausea/vomiting, patient unable to keep anything down.  Denied any fever/chills.  In the ED, patient noted to be requiring 4L [place], chest x-ray and CT done showed bilateral pleural effusions, concern for vascular congestion.  CT abdomen pelvis showed no acute intra-abdominal process. Patient was admitted for the management of  CHF exacerbation.  Cardiology consulted.  Started on diuresis.   Assessment & Plan:   Principal Problem:   Acute on chronic systolic CHF (congestive heart failure) (HCC) Active Problems:   Hyperlipidemia   Coronary artery disease involving native coronary artery of native heart with angina pectoris (HCC)   Cerebral artery occlusion with cerebral infarction ([place])   Malignant neoplasm of upper-outer quadrant of left breast in female, estrogen receptor positive ([place])   H/O: CVA (cerebrovascular accident)   History of myocardial infarction   Diabetes mellitus ([place])   HTN (hypertension)   Acute hypoxemic respiratory failure (HCC)   Nausea and vomiting  Acute on chronic systolic heart failure: Mild elevated BNP.  Chest x-ray showed pulmonary vascular congestion.  Echocardiogram showed ejection fraction of 45 to 50%, PA peak pressure of 57 mmHg.  Cardiology is following.  On IV Lasix 40 mg twice daily.  Plan is to change to oral Lasix today. Continue input and output  measures.  Acute hypoxic respiratory failure: Secondary to excessive exertion.  Continue supplemental oxygen as needed.  Currently respiratory status stable.  Acute gastroenteritis: Afebrile, no leukocytosis.  GI pathogen panel, C. difficile negative.  Diarrhea has stopped.  CT abdomen/pelvis without any acute changes.  History of recent Bactrim use.  Acute kidney injury: We will continue to monitor.We will taper lasix.  History of coronary disease/elevated troponin: Troponins flat.  EKG without any acute ischemic changes.  Cardiology following.  Denies any chest pain.  Continue ASA, statin, beta-blocker.  History of mastitis: Currently no evidence of infection.  She was on antibiotics before.  Chronic disease: Stable.  Hemoglobin at baseline.    History of hypertension/hyperlipidemia: Continue home meds.  Continue Lopressor,statin.  BP on the hiher side.  Diabetes mellitus: Continue  current insulin regimen  Left breast cancer: on tamoxifen  Depression/anxiety: Continue citalopram, temazepam  Confusion/delirium: Reported to be confused last night.  Currently alert and oriented.  We will continue to monitor the mental status.  Deconditioning/debility: Patient evaluated by PT and recommended skilled nursing facility on discharge.  Social worker consulted.    DVT prophylaxis: Lovenox Code Status: DNR Family Communication: Daughter present at the bedside Disposition Plan: Patient is stable for discharge to skilled nursing facility as soon as the bed is available   Consultants: Cardiology  Procedures:None  Antimicrobials:None  Subjective: Patient seen and examined at the bedside this morning.  Remains comfortable.  Mental status stable..  Objective: Vitals:   08/21/17 0414 08/21/17 0457 08/21/17 0800 08/21/17 1200  BP:  (!) 161/66 (!) 162/81   Pulse:  81 91   Resp:  13 18   Temp:   98.8  F (37.1 C) 99 F (37.2 C)  TempSrc:   Oral Oral  SpO2:  97% 98%   Weight: 65.3  kg (143 lb 15.4 oz)     Height:        Intake/Output Summary (Last 24 hours) at 08/21/2017 1413 Last data filed at 08/21/2017 1121 Gross per 24 hour  Intake 365 ml  Output 3100 ml  Net -2735 ml   Filed Weights   08/19/17 0802 08/21/17 0414  Weight: 68.9 kg (152 lb) 65.3 kg (143 lb 15.4 oz)    Examination:  General exam: Appears calm and comfortable ,Not in distress,average built HEENT:PERRL,Oral mucosa moist, Ear/Nose normal on gross exam Respiratory system: Bilateral equal air entry, normal vesicular breath sounds, no wheezes or crackles  Cardiovascular system: S1 & S2 heard, RRR. No JVD, murmurs, rubs, gallops or clicks. No pedal edema. Gastrointestinal system: Abdomen is nondistended, soft and nontender. No organomegaly or masses felt. Normal bowel sounds heard. Central nervous system: Alert and oriented. No focal neurological deficits. Extremities: No edema, no clubbing ,no cyanosis, distal peripheral pulses palpable. Skin: No rashes, lesions or ulcers,no icterus ,no pallor MSK: Normal muscle bulk,tone ,power Psychiatry: Judgement and insight appear normal. Mood & affect appropriate.     Data Reviewed: I have personally reviewed following labs and imaging studies  CBC: Recent Labs  Lab 08/19/17 0933 08/20/17 0654 08/21/17 0302  WBC 14.7* 9.7 11.9*  NEUTROABS  --   --  9.9*  HGB 9.2* 9.4* 9.8*  HCT 29.5* 30.1* 30.8*  MCV 94.2 93.2 90.9  PLT 394 382 578*   Basic Metabolic Panel: Recent Labs  Lab 08/19/17 0933 08/20/17 0654 08/21/17 0302  NA 139 136 134*  K 4.9 4.2 4.0  CL 109 101 94*  CO2 21* 21* 26  GLUCOSE 257* 132* 199*  BUN 16 18 19   CREATININE 1.12* 1.29* 1.25*  CALCIUM 8.8* 8.9 8.6*   GFR: Estimated Creatinine Clearance: 31.2 mL/min (A) (by C-G formula based on SCr of 1.25 mg/dL (H)). Liver Function Tests: Recent Labs  Lab 08/19/17 0933  AST 26  ALT 12*  ALKPHOS 141*  BILITOT 0.6  PROT 7.0  ALBUMIN 3.8   Recent Labs  Lab 08/19/17 0933   LIPASE 17   No results for input(s): AMMONIA in the last 168 hours. Coagulation Profile: Recent Labs  Lab 08/20/17 0956  INR 1.18   Cardiac Enzymes: Recent Labs  Lab 08/19/17 1230 08/19/17 1816 08/20/17 0020 08/20/17 0654 08/21/17 0302  TROPONINI 0.05* 0.08* 0.09* 0.09* 0.06*   BNP (last 3 results) No results for input(s): PROBNP in the last 8760 hours. HbA1C: No results for input(s): HGBA1C in the last 72 hours. CBG: Recent Labs  Lab 08/20/17 1709 08/20/17 2107 08/21/17 0752 08/21/17 0916 08/21/17 1206  GLUCAP 115* 269* 173* 170* 148*   Lipid Profile: No results for input(s): CHOL, HDL, LDLCALC, TRIG, CHOLHDL, LDLDIRECT in the last 72 hours. Thyroid Function Tests: Recent Labs    08/19/17 1816  TSH 1.988   Anemia Panel: No results for input(s): VITAMINB12, FOLATE, FERRITIN, TIBC, IRON, RETICCTPCT in the last 72 hours. Sepsis Labs: No results for input(s): PROCALCITON, LATICACIDVEN in the last 168 hours.  Recent Results (from the past 240 hour(s))  MRSA PCR Screening     Status: None   Collection Time: 08/19/17 10:48 PM  Result Value Ref Range Status   MRSA by PCR NEGATIVE NEGATIVE Final    Comment:        The GeneXpert MRSA Assay (FDA approved  for NASAL specimens only), is one component of a comprehensive MRSA colonization surveillance program. It is not intended to diagnose MRSA infection nor to guide or monitor treatment for MRSA infections. Performed at [facility], [place] [address], [place], [place] [address]          Radiology Studies: Ct Abdomen Pelvis Wo Contrast  Result Date: 08/19/2017 CLINICAL DATA:  Acute abdominal pain. Nausea, vomiting, and diarrhea. History of breast cancer with lumpectomy July 08, 2017. EXAM: CT ABDOMEN AND PELVIS WITHOUT CONTRAST TECHNIQUE: Multidetector CT imaging of the abdomen and pelvis was performed following the standard protocol without IV contrast. COMPARISON:  April 27, 2011  FINDINGS: Lower chest: Skin thickening in the inferior left breast, incompletely evaluated. Small to moderate bilateral pleural effusions. Large hiatal hernia. Mild cardiomegaly. A calcified nodule in the lingula is of no significance. Opacity under the bilateral effusions is likely atelectasis. No suspicious infiltrate identified. Hepatobiliary: No focal liver abnormality is seen. No gallstones, gallbladder wall thickening, or biliary dilatation. Pancreas: Unremarkable. No pancreatic ductal dilatation or surrounding inflammatory changes. Spleen: Normal in size without focal abnormality. Adrenals/Urinary Tract: Adrenal glands are normal. No renal stones or hydronephrosis. No suspicious masses. The ureters and bladder are normal. Stomach/Bowel: Other than the hiatal hernia, the stomach is normal. The small bowel is normal. Colonic diverticulosis is seen without diverticulitis. The colon is normal. The appendix is been removed by report. Vascular/Lymphatic: Atherosclerotic changes are seen in the nonaneurysmal tortuous aorta and iliac vessels. No adenopathy. Reproductive: Status post hysterectomy. No adnexal masses. Other: No free air or free fluid. Increased attenuation in the subcutaneous fat diffusely suggests volume overload. Musculoskeletal: Evaluation of the left hip and pelvic bones is limited due to motion. Cortical malalignment in the left pelvic bones on series 4, image 47 is thought to be artifactual due to motion. Recommend clinical correlation. Grade 1 anterolisthesis of L4 versus L5. No fractures or traumatic malalignment. IMPRESSION: 1. No cause for the patient's abdominal symptoms identified. 2. Skin thickening in the inferior left breast is incompletely evaluated. This could be due to recent surgery or radiation. Recommend clinical correlation. Given history, if there is concern for a breast infection, ultrasound could better evaluate. 3. Large hiatal hernia. 4. Mild cardiomegaly, pleural effusions,  and subcutaneous fat edema suggests volume overload. 5. Colonic diverticulosis without diverticulitis. 6. Atherosclerotic changes in the aorta and iliac vessels. Electronically Signed   By: Dorise Bullion III M.D   On: 08/19/2017 15:23   Dg Chest Port 1 View  Result Date: 08/20/2017 CLINICAL DATA:  Shortness of breath, chest pain. EXAM: PORTABLE CHEST 1 VIEW COMPARISON:  Radiographs Aug 19, 2017. FINDINGS: Stable cardiomegaly with central pulmonary vascular congestion. Bibasilar interstitial densities are noted concerning for edema and possible mild pleural effusions. No pneumothorax is noted. Bony thorax is unremarkable. IMPRESSION: Stable cardiomegaly with central pulmonary vascular congestion. Probable mild bibasilar pulmonary edema is noted with small pleural effusions. Electronically Signed   By: Marijo Conception, M.D.   On: 08/20/2017 08:42        Scheduled Meds: . aspirin EC  81 mg Oral Daily  . enoxaparin (LOVENOX) injection  40 mg Subcutaneous Q24H  . escitalopram  10 mg Oral Daily  . [START ON 08/22/2017] furosemide  40 mg Oral Daily  . haloperidol lactate  1 mg Intramuscular Once  . insulin aspart  0-9 Units Subcutaneous TID WC  . insulin aspart  3 Units Subcutaneous TID AC  . insulin detemir  6 Units Subcutaneous  BID  . mouth rinse  15 mL Mouth Rinse BID  . metoprolol tartrate  25 mg Oral BID  . pantoprazole  40 mg Oral Daily  . simvastatin  40 mg Oral QHS   Continuous Infusions: . acetaminophen       LOS: 2 days    Time spent: More than 50% of that time was spent in counseling and/or coordination of care.      Shelly Coss, MD [facility] Pager [phone]  If 7PM-7AM, please contact night-coverage [email or web address] Password [facility] 08/21/2017, 2:13 PM

## 2017-08-21 NOTE — Progress Notes (Addendum)
Progress Note  Patient Name: [name] Date of Encounter: 08/21/2017  Primary Cardiologist: Shirlee More, MD   Subjective   Breathing better today but remains on 2L [place]. Denies CP. Main complaint is LBP and bilateral leg pain.   Inpatient Medications    Scheduled Meds: . aspirin EC  81 mg Oral Daily  . enoxaparin (LOVENOX) injection  40 mg Subcutaneous Q24H  . escitalopram  10 mg Oral Daily  . furosemide  40 mg Intravenous BID  . haloperidol lactate  1 mg Intramuscular Once  . insulin aspart  0-9 Units Subcutaneous TID WC  . insulin aspart  3 Units Subcutaneous TID AC  . insulin detemir  6 Units Subcutaneous BID  . mouth rinse  15 mL Mouth Rinse BID  . metoprolol tartrate  25 mg Oral BID  . pantoprazole  40 mg Oral Daily  . simvastatin  40 mg Oral QHS   Continuous Infusions: . acetaminophen     PRN Meds: acetaminophen, acetaminophen **OR** acetaminophen, hydrALAZINE, HYDROcodone-acetaminophen, HYDROcodone-acetaminophen, HYDROmorphone (DILAUDID) injection, meperidine (DEMEROL) injection, ondansetron **OR** ondansetron (ZOFRAN) IV, promethazine, temazepam   Vital Signs    Vitals:   08/21/17 0312 08/21/17 0414 08/21/17 0457 08/21/17 0800  BP:   (!) 161/66 (!) 162/81  Pulse:   81 91  Resp:   13 18  Temp: 98.8 F (37.1 C)   98.8 F (37.1 C)  TempSrc: Oral   Oral  SpO2:   97% 98%  Weight:  143 lb 15.4 oz (65.3 kg)    Height:        Intake/Output Summary (Last 24 hours) at 08/21/2017 1144 Last data filed at 08/21/2017 1121 Gross per 24 hour  Intake 620.16 ml  Output 4690 ml  Net -4069.84 ml   Filed Weights   08/19/17 0802 08/21/17 0414  Weight: 152 lb (68.9 kg) 143 lb 15.4 oz (65.3 kg)    Telemetry    NSR with burst of SVT - Personally Reviewed  ECG    NSR w/ PACs - Personally Reviewed  Physical Exam   GEN: No acute distress.  Elderly  Neck: No JVD Cardiac: RRR, no murmurs, rubs, or gallops.  Respiratory: Clear to auscultation bilaterally. GI:  Soft, nontender, non-distended  MS: No edema; No deformity. Neuro:  Nonfocal  Psych: Normal affect   Labs    Chemistry Recent Labs  Lab 08/19/17 0933 08/20/17 0654 08/21/17 0302  NA 139 136 134*  K 4.9 4.2 4.0  CL 109 101 94*  CO2 21* 21* 26  GLUCOSE 257* 132* 199*  BUN 16 18 19   CREATININE 1.12* 1.29* 1.25*  CALCIUM 8.8* 8.9 8.6*  PROT 7.0  --   --   ALBUMIN 3.8  --   --   AST 26  --   --   ALT 12*  --   --   ALKPHOS 141*  --   --   BILITOT 0.6  --   --   GFRNONAA 44* 37* 39*  GFRAA 52* 43* 45*  ANIONGAP 9 14 14      Hematology Recent Labs  Lab 08/19/17 0933 08/20/17 0654 08/21/17 0302  WBC 14.7* 9.7 11.9*  RBC 3.13* 3.23* 3.39*  HGB 9.2* 9.4* 9.8*  HCT 29.5* 30.1* 30.8*  MCV 94.2 93.2 90.9  MCH 29.4 29.1 28.9  MCHC 31.2 31.2 31.8  RDW 14.2 14.2 14.0  PLT 394 382 426*    Cardiac Enzymes Recent Labs  Lab 08/19/17 1816 08/20/17 0020 08/20/17 0654 08/21/17 0302  TROPONINI 0.08* 0.09* 0.09* 0.06*   No results for input(s): TROPIPOC in the last 168 hours.   BNP Recent Labs  Lab 08/19/17 0933  BNP 409.6*     DDimer No results for input(s): DDIMER in the last 168 hours.   Radiology    Ct Abdomen Pelvis Wo Contrast  Result Date: 08/19/2017 CLINICAL DATA:  Acute abdominal pain. Nausea, vomiting, and diarrhea. History of breast cancer with lumpectomy July 08, 2017. EXAM: CT ABDOMEN AND PELVIS WITHOUT CONTRAST TECHNIQUE: Multidetector CT imaging of the abdomen and pelvis was performed following the standard protocol without IV contrast. COMPARISON:  April 27, 2011 FINDINGS: Lower chest: Skin thickening in the inferior left breast, incompletely evaluated. Small to moderate bilateral pleural effusions. Large hiatal hernia. Mild cardiomegaly. A calcified nodule in the lingula is of no significance. Opacity under the bilateral effusions is likely atelectasis. No suspicious infiltrate identified. Hepatobiliary: No focal liver abnormality is seen. No  gallstones, gallbladder wall thickening, or biliary dilatation. Pancreas: Unremarkable. No pancreatic ductal dilatation or surrounding inflammatory changes. Spleen: Normal in size without focal abnormality. Adrenals/Urinary Tract: Adrenal glands are normal. No renal stones or hydronephrosis. No suspicious masses. The ureters and bladder are normal. Stomach/Bowel: Other than the hiatal hernia, the stomach is normal. The small bowel is normal. Colonic diverticulosis is seen without diverticulitis. The colon is normal. The appendix is been removed by report. Vascular/Lymphatic: Atherosclerotic changes are seen in the nonaneurysmal tortuous aorta and iliac vessels. No adenopathy. Reproductive: Status post hysterectomy. No adnexal masses. Other: No free air or free fluid. Increased attenuation in the subcutaneous fat diffusely suggests volume overload. Musculoskeletal: Evaluation of the left hip and pelvic bones is limited due to motion. Cortical malalignment in the left pelvic bones on series 4, image 47 is thought to be artifactual due to motion. Recommend clinical correlation. Grade 1 anterolisthesis of L4 versus L5. No fractures or traumatic malalignment. IMPRESSION: 1. No cause for the patient's abdominal symptoms identified. 2. Skin thickening in the inferior left breast is incompletely evaluated. This could be due to recent surgery or radiation. Recommend clinical correlation. Given history, if there is concern for a breast infection, ultrasound could better evaluate. 3. Large hiatal hernia. 4. Mild cardiomegaly, pleural effusions, and subcutaneous fat edema suggests volume overload. 5. Colonic diverticulosis without diverticulitis. 6. Atherosclerotic changes in the aorta and iliac vessels. Electronically Signed   By: Dorise Bullion III M.D   On: 08/19/2017 15:23   Dg Chest Port 1 View  Result Date: 08/20/2017 CLINICAL DATA:  Shortness of breath, chest pain. EXAM: PORTABLE CHEST 1 VIEW COMPARISON:   Radiographs Aug 19, 2017. FINDINGS: Stable cardiomegaly with central pulmonary vascular congestion. Bibasilar interstitial densities are noted concerning for edema and possible mild pleural effusions. No pneumothorax is noted. Bony thorax is unremarkable. IMPRESSION: Stable cardiomegaly with central pulmonary vascular congestion. Probable mild bibasilar pulmonary edema is noted with small pleural effusions. Electronically Signed   By: Marijo Conception, M.D.   On: 08/20/2017 08:42    Cardiac Studies   2D Echo 08/20/17  Study Conclusions  - Left ventricle: The cavity size was normal. Wall thickness was   normal. Systolic function was mildly reduced. The estimated   ejection fraction was in the range of 45% to 50%. There is   akinesis of the inferoseptal myocardium. There was no evidence of   elevated ventricular filling pressure by Doppler parameters. - Mitral valve: There was moderate regurgitation directed   posteriorly. - Left atrium: The atrium was severely  dilated. - Tricuspid valve: There was moderate regurgitation. - Pulmonary arteries: Systolic pressure was moderately increased.   PA peak pressure: 57 mm Hg (S).   Patient Profile     [name] is a 84 y.o. female with a hx of CAD, HTN, HLD, and recent malignant neoplasm of Lt breast who is being seen today for the evaluation of elevated troponin on admit for weakness, nausea, abd pain diarrhea  at the request of Dr. Horris Latino. Also being treated for acute on chronic systolic CHF.   Assessment & Plan    1. Elevated Troponin: minimally elevated with flat trend, not c/w ACS. EKG also w/o significant ST/T changes, compared to baseline. No anginal symptoms. Suspect demand ischemia, secondary to acute CHF. Continue management of systolic HF, as outlined below. We will continue conservative management of CAD given advanced age and other co morbidities. No further ischemic w/u indicated.   2. Acute on Chronic Systolic CHF: EF 28-31%  on echo with akinesis of the inferoseptal myocardium. BNP 409 on admit. She has responded well to IV Lasix. I/Os net negative 5.4L since admit. Weight is down from 152 lb to 143 lb. Slight bump in SCr from 1.12>>1.29, but slightly improved today at 1.25. K is WNL. She appears euvolemic on exam. No peripheral edema and lungs are CTAB. Will defer to MD, standing dose of PO Lasix vs PRN Lasix, based on daily weights.   3. CAD: per above. No anginal symptomatolgy and no objective findings to suggest ACS. Continue conservative medical management, w/ ASA, statin and BB. No plans for ischemic w/u.    For questions or updates, please contact [place] Please consult [email or web address] for contact info under Cardiology/STEMI.   Signed, Lyda Jester, PA-C  08/21/2017, 11:44 AM    Patient seen, examined. Available data reviewed. Agree with findings, assessment, and plan as outlined by Lyda Jester, PA-C.   On exam this is an elderly woman in NAD. JVP nl, lungs CTA, heart RRR no murmur, extremities without edema. Otherwise as documented above.   Echo findings reviewed. LVEF 45-50% with segmental LV dysfunction. Would change furosemide to oral dosing 40 mg daily. Otherwise continue current Rx as outlined above. Appears to have stabilized from cardiac perspective. Please call if we can be of further assistance.   Sherren Mocha, M.D. 08/21/2017 1:48 PM

## 2017-08-21 NOTE — Progress Notes (Addendum)
Pt has become increasingly confused overnight. Pt was oriented x 4 at 1900, however is now disoriented x3. Pt is confused and trying to get OOB, pulling at equipment, has pulled out her IV access. RN & NT have continually  redirected/ reoriented Pt overnight. RN will continue to monitor.

## 2017-08-21 NOTE — Clinical Social Work Note (Signed)
Clinical Social Work Assessment  Patient Details  Name: [name] MRN: [id number] Date of Birth: 1932/04/17  Date of referral:  08/21/17               Reason for consult:  Facility Placement                Permission sought to share information with:  Family Supports Permission granted to share information::  Yes, Verbal Permission Granted  Name::        Agency::  SNF  Relationship::  Daughter-HCPOA   Contact Information:   Daytime work number: [phone]  Housing/Transportation Living arrangements for the past 2 months:  Single Family Home Source of Information:  Patient, Adult Children Patient Interpreter Needed:  None Criminal Activity/Legal Involvement Pertinent to Current Situation/Hospitalization:  No - Comment as needed Significant Relationships:  Adult Children Lives with:  Other (Comment) Do you feel safe going back to the place where you live?  Yes Need for family participation in patient care:  Yes (Dependent with mobility and self care)  Care giving concerns:  Abdominal pain. Diarrhea and SOB  Physical therapy is recommending SNF placement for ST rehab stay.   Social Worker assessment / plan:  CSW met with the patient and her daughter at bedside. Patient alert x2 and agreeable to talk with CSW about SNF placement options. Patient hard of hearing so referred to her daughter to lead the discussion. Patient daughter reports this "is the sickest she has ever been." She reports in April the patient had surgery on breast due to cancer and since the surgery has had respiratory difficulties. Patient daughter reports the patient will need rehab at this point because she is very weak.  She reports the patient lives at home with her Yolanda Bonine and he helps manage patient diabetic medications and other care. Daughter reports the patient uses a walker w/ seat around the house. She reports the patient has fallen a lot in the past year. The patient and daughter are agreeable to SNF  for placement. CSW explain SNF process and later following up with bed offers.   FL2 complete.  PASRR under manuel review.   Plan: SNF  Employment status:  Retired Nurse, adult PT Recommendations:  [place] / Referral to community resources:  [place]  Patient/Family's Response to care:  Agreeable and Responding to care.   Patient/Family's Understanding of and Emotional Response to Diagnosis, Current Treatment, and Prognosis:  Patient has basic understanding of her diagnosis. Patient daughter very involved in patient care and has good understnading of her diagnosis, treatment and prognosis. Patient daughter   Emotional Assessment Appearance:  Appears stated age Attitude/Demeanor/Rapport:    Affect (typically observed):  Accepting, Calm Orientation:  Oriented to Self, Oriented to Place, Oriented to Situation Alcohol / Substance use:  Not Applicable Psych involvement (Current and /or in the community):  No (Comment)  Discharge Needs  Concerns to be addressed:  Discharge Planning Concerns Readmission within the last 30 days:  No Current discharge risk:  Dependent with Mobility Barriers to Discharge:  Continued Medical Work up, [place], [place] 08/21/2017, 1:21 PM

## 2017-08-22 DIAGNOSIS — I4891 Unspecified atrial fibrillation: Secondary | ICD-10-CM

## 2017-08-22 DIAGNOSIS — I480 Paroxysmal atrial fibrillation: Secondary | ICD-10-CM

## 2017-08-22 LAB — BASIC METABOLIC PANEL
Anion gap: 12 (ref 5–15)
BUN: 24 mg/dL — AB (ref 6–20)
CALCIUM: 7.7 mg/dL — AB (ref 8.9–10.3)
CO2: 28 mmol/L (ref 22–32)
CREATININE: 1.47 mg/dL — AB (ref 0.44–1.00)
Chloride: 94 mmol/L — ABNORMAL LOW (ref 101–111)
GFR calc Af Amer: 37 mL/min — ABNORMAL LOW (ref >60)
GFR calc non Af Amer: 32 mL/min — ABNORMAL LOW (ref >60)
Glucose, Bld: 293 mg/dL — ABNORMAL HIGH (ref 65–99)
Potassium: 3.9 mmol/L (ref 3.5–5.1)
SODIUM: 134 mmol/L — AB (ref 135–145)

## 2017-08-22 LAB — GLUCOSE, CAPILLARY
Glucose-Capillary: 258 mg/dL — ABNORMAL HIGH (ref 65–99)
Glucose-Capillary: 207 mg/dL — ABNORMAL HIGH (ref 65–99)
Glucose-Capillary: 29 mg/dL — CL (ref 65–99)
Glucose-Capillary: 67 mg/dL (ref 65–99)
Glucose-Capillary: 82 mg/dL (ref 65–99)
Glucose-Capillary: 293 mg/dL — ABNORMAL HIGH (ref 65–99)

## 2017-08-22 MED ORDER — METOPROLOL TARTRATE 25 MG PO TABS
25.0000 mg | ORAL_TABLET | Freq: Once | ORAL | Status: AC
  Administered 2017-08-22: 25 mg via ORAL
  Filled 2017-08-22: qty 1

## 2017-08-22 MED ORDER — HEPARIN SODIUM (PORCINE) 5000 UNIT/ML IJ SOLN
5000.0000 [IU] | Freq: Three times a day (TID) | INTRAMUSCULAR | Status: DC
Start: 2017-08-22 — End: 2017-08-23
  Administered 2017-08-22 (×3): 5000 [IU] via SUBCUTANEOUS
  Filled 2017-08-22 (×3): qty 1

## 2017-08-22 MED ORDER — METOPROLOL TARTRATE 25 MG PO TABS: 50.0000 mg | ORAL_TABLET | Freq: Two times a day (BID) | ORAL | Status: DC

## 2017-08-22 NOTE — Progress Notes (Signed)
PT Cancellation Note  Patient Details Name: [name] MRN: [id number] DOB: 01/07/1933   Cancelled Treatment:    Reason Eval/Treat Not Completed: Medical issues which prohibited therapy,on Medication for BP.    Claretha Cooper 08/22/2017, 7:20 AM  Tresa Endo PT [phone]

## 2017-08-22 NOTE — Progress Notes (Signed)
PROGRESS NOTE    VERONA HARTSHORN  IRJ:[id number] DOB: 01/05/1933 DOA: 08/19/2017 PCP: Burnard Bunting, MD   Brief Narrative: CIEANNA STORMES is a 84 year old female with medical history significant for CAD status post MI, CVA during cardiac cath, hypertension, hyperlipidemia, breast cancer S/P left lumpectomy in 07/2017, DM type II, presents to the ED complaining of nausea/vomiting, watery diarrhea, abdominal pain, shortness of breath/orthopnea/PND, lower extremity edema for the past 2 weeks. She is tatus post lumpectomy, she had also developed presumed mastitis and was started on Bactrim.  Due to the persistent nausea/vomiting at home , patient unable to keep anything down. She denied any fever/chills. In the ED, patient noted to be requiring 4L [place], chest x-ray and CT done showed bilateral pleural effusions, concern for vascular congestion.  CT abdomen pelvis showed no acute intra-abdominal process. Patient was admitted for the management of  CHF exacerbation.  Cardiology consulted.  Started on diuresis.  Patient developed A. fib with RVR.  Currently she is on amiodarone drip. Waiting for cardiology recommendation for starting on anticoagulation if decided so.   Assessment & Plan:   Principal Problem:   Acute on chronic systolic CHF (congestive heart failure) (HCC) Active Problems:   Hyperlipidemia   Coronary artery disease involving native coronary artery of native heart with angina pectoris (HCC)   Cerebral artery occlusion with cerebral infarction ([place])   Malignant neoplasm of upper-outer quadrant of left breast in female, estrogen receptor positive ([place])   H/O: CVA (cerebrovascular accident)   History of myocardial infarction   Diabetes mellitus ([place])   HTN (hypertension)   Acute hypoxemic respiratory failure (HCC)   Nausea and vomiting   Atrial fibrillation with RVR (HCC)  Acute on chronic systolic heart failure: Mild elevated BNP.  Chest x-ray had showed pulmonary vascular  congestion.  Echocardiogram showed ejection fraction of 45 to 50%, PA peak pressure of 57 mmHg.  Cardiology is following.  On oral Lasix daily. Continue input and output measures.Net output of around 6 L since admission.  A. fib with RVR: New onset.  Increase the dose of metoprolol to 50 twice a day.  On amiodarone drip.  Cardiology following.  Acute hypoxic respiratory failure: Secondary to CHF exacerbation  Continue supplemental oxygen as needed.  Currently respiratory status stable.  Acute gastroenteritis: Afebrile, no leukocytosis.  GI pathogen panel, C. difficile negative.  Diarrhea has stopped.  CT abdomen/pelvis without any acute changes.  History of recent Bactrim use.  Acute kidney injury: We will continue to monitor.on Lasix daily.She was  hypotensive overnight which could have worsened the kidney function.  History of coronary disease/elevated troponin: Troponins flat.  EKG without any acute ischemic changes.  Cardiology following.  Denies any chest pain.  Continue ASA, statin, beta-blocker.  History of mastitis: Currently no evidence of infection.  She was on antibiotics before.  Chronic disease: Stable.  Hemoglobin at baseline.    History of hypertension/hyperlipidemia: Continue home meds.  Continue Lopressor,statin.  BP on the higer side.  Dose of metoprolol increased.  Diabetes mellitus: Continue  current insulin regimen  Left breast cancer: on tamoxifen  Depression/anxiety: Continue citalopram, temazepam  Confusion/delirium:   Currently alert and oriented.  We will continue to monitor the mental status.  Deconditioning/debility: Patient evaluated by PT and recommended skilled nursing facility on discharge.  Social worker following.    DVT prophylaxis: Hep [place] Code Status: DNR Family Communication: Discussed with daughter yesterday Disposition Plan: Skilled nursing facility once heart rate stabilizes.  Awaiting cardiology recommendation  Consultants:  Cardiology  Procedures:None  Antimicrobials:None  Subjective: Patient seen and examined at the bedside this morning.  Remains comfortable.  Mental status stable.She is still on  A. fib with RVR.  Blood pressure on the higher side.  Patient says she is  frustrated with all the things going on and says that she is ready to go to the [facility].  Objective: Vitals:   08/22/17 0152 08/22/17 0350 08/22/17 0400 08/22/17 0800  BP:   99/61   Pulse:   (!) 115   Resp:   11   Temp:  98.6 F (37 C)  98.3 F (36.8 C)  TempSrc:  Oral  Oral  SpO2:   99%   Weight: 64.2 kg (141 lb 8.6 oz)     Height:        Intake/Output Summary (Last 24 hours) at 08/22/2017 1008 Last data filed at 08/22/2017 0500 Gross per 24 hour  Intake 240 ml  Output 1450 ml  Net -1210 ml   Filed Weights   08/19/17 0802 08/21/17 0414 08/22/17 0152  Weight: 68.9 kg (152 lb) 65.3 kg (143 lb 15.4 oz) 64.2 kg (141 lb 8.6 oz)    Examination:  General exam: Appears calm and comfortable ,Not in distress,average built HEENT:PERRL,Oral mucosa moist, Ear/Nose normal on gross exam Respiratory system: Bilateral decreased air entry in the bases Cardiovascular system: A. fib with RVR, no JVD, murmurs, rubs, gallops or clicks. Gastrointestinal system: Abdomen is nondistended, soft and nontender. No organomegaly or masses felt. Normal bowel sounds heard. Central nervous system: Alert and oriented. No focal neurological deficits. Extremities: No edema, no clubbing ,no cyanosis, distal peripheral pulses palpable. Skin: No rashes, lesions or ulcers,no icterus ,no pallor Psychiatry: Judgement and insight appear normal. Mood & affect appropriate.     Data Reviewed: I have personally reviewed following labs and imaging studies  CBC: Recent Labs  Lab 08/19/17 0933 08/20/17 0654 08/21/17 0302  WBC 14.7* 9.7 11.9*  NEUTROABS  --   --  9.9*  HGB 9.2* 9.4* 9.8*  HCT 29.5* 30.1* 30.8*  MCV 94.2 93.2 90.9  PLT 394 382 426*   Basic  Metabolic Panel: Recent Labs  Lab 08/19/17 0933 08/20/17 0654 08/21/17 0302 08/22/17 0313  NA 139 136 134* 134*  K 4.9 4.2 4.0 3.9  CL 109 101 94* 94*  CO2 21* 21* 26 28  GLUCOSE 257* 132* 199* 293*  BUN 16 18 19  24*  CREATININE 1.12* 1.29* 1.25* 1.47*  CALCIUM 8.8* 8.9 8.6* 7.7*   GFR: Estimated Creatinine Clearance: 26.6 mL/min (A) (by C-G formula based on SCr of 1.47 mg/dL (H)). Liver Function Tests: Recent Labs  Lab 08/19/17 0933  AST 26  ALT 12*  ALKPHOS 141*  BILITOT 0.6  PROT 7.0  ALBUMIN 3.8   Recent Labs  Lab 08/19/17 0933  LIPASE 17   No results for input(s): AMMONIA in the last 168 hours. Coagulation Profile: Recent Labs  Lab 08/20/17 0956  INR 1.18   Cardiac Enzymes: Recent Labs  Lab 08/19/17 1230 08/19/17 1816 08/20/17 0020 08/20/17 0654 08/21/17 0302  TROPONINI 0.05* 0.08* 0.09* 0.09* 0.06*   BNP (last 3 results) No results for input(s): PROBNP in the last 8760 hours. HbA1C: No results for input(s): HGBA1C in the last 72 hours. CBG: Recent Labs  Lab 08/21/17 0916 08/21/17 1206 08/21/17 1702 08/21/17 2147 08/22/17 0729  GLUCAP 170* 148* 93 271* 258*   Lipid Profile: No results for input(s): CHOL, HDL, LDLCALC, TRIG, CHOLHDL, LDLDIRECT in the last  72 hours. Thyroid Function Tests: Recent Labs    08/19/17 1816  TSH 1.988   Anemia Panel: No results for input(s): VITAMINB12, FOLATE, FERRITIN, TIBC, IRON, RETICCTPCT in the last 72 hours. Sepsis Labs: No results for input(s): PROCALCITON, LATICACIDVEN in the last 168 hours.  Recent Results (from the past 240 hour(s))  MRSA PCR Screening     Status: None   Collection Time: 08/19/17 10:48 PM  Result Value Ref Range Status   MRSA by PCR NEGATIVE NEGATIVE Final    Comment:        The GeneXpert MRSA Assay (FDA approved for NASAL specimens only), is one component of a comprehensive MRSA colonization surveillance program. It is not intended to diagnose MRSA infection nor to  guide or monitor treatment for MRSA infections. Performed at [facility], [place] [address], [place], [place] [address]          Radiology Studies: No results found.      Scheduled Meds: . aspirin EC  81 mg Oral Daily  . escitalopram  10 mg Oral Daily  . furosemide  40 mg Oral Daily  . haloperidol lactate  1 mg Intramuscular Once  . heparin injection (subcutaneous)  5,000 Units Subcutaneous Q8H  . insulin aspart  0-9 Units Subcutaneous TID WC  . insulin aspart  3 Units Subcutaneous TID AC  . insulin detemir  6 Units Subcutaneous BID  . mouth rinse  15 mL Mouth Rinse BID  . metoprolol tartrate  25 mg Oral Once  . metoprolol tartrate  50 mg Oral BID  . pantoprazole  40 mg Oral Daily  . simvastatin  40 mg Oral QHS   Continuous Infusions: . amiodarone 30 mg/hr (08/22/17 0152)     LOS: 3 days    Time spent: 25 mins.More than 50% of that time was spent in counseling and/or coordination of care.      Shelly Coss, MD [facility] Pager [phone]  If 7PM-7AM, please contact night-coverage [email or web address] Password [email or web address] 08/22/2017, 10:08 AM

## 2017-08-22 NOTE — Progress Notes (Addendum)
Progress Note  Patient Name: [name] Date of Encounter: 08/22/2017  Primary Cardiologist: Shirlee More, MD   Subjective   No palpitation. She does note intermittent sharp chest pain. No dyspnea. Main complaint is chronic leg and LBP.   Inpatient Medications    Scheduled Meds: . aspirin EC  81 mg Oral Daily  . escitalopram  10 mg Oral Daily  . furosemide  40 mg Oral Daily  . haloperidol lactate  1 mg Intramuscular Once  . heparin injection (subcutaneous)  5,000 Units Subcutaneous Q8H  . insulin aspart  0-9 Units Subcutaneous TID WC  . insulin aspart  3 Units Subcutaneous TID AC  . insulin detemir  6 Units Subcutaneous BID  . mouth rinse  15 mL Mouth Rinse BID  . metoprolol tartrate  25 mg Oral Once  . metoprolol tartrate  50 mg Oral BID  . pantoprazole  40 mg Oral Daily  . simvastatin  40 mg Oral QHS   Continuous Infusions: . amiodarone 30 mg/hr (08/22/17 0152)   PRN Meds: acetaminophen **OR** acetaminophen, hydrALAZINE, HYDROcodone-acetaminophen, HYDROcodone-acetaminophen, HYDROmorphone (DILAUDID) injection, meperidine (DEMEROL) injection, ondansetron **OR** ondansetron (ZOFRAN) IV, promethazine, temazepam   Vital Signs    Vitals:   08/22/17 0152 08/22/17 0350 08/22/17 0400 08/22/17 0800  BP:   99/61   Pulse:   (!) 115   Resp:   11   Temp:  98.6 F (37 C)  98.3 F (36.8 C)  TempSrc:  Oral  Oral  SpO2:   99%   Weight: 141 lb 8.6 oz (64.2 kg)     Height:        Intake/Output Summary (Last 24 hours) at 08/22/2017 1109 Last data filed at 08/22/2017 0500 Gross per 24 hour  Intake 240 ml  Output 1450 ml  Net -1210 ml   Filed Weights   08/19/17 0802 08/21/17 0414 08/22/17 0152  Weight: 152 lb (68.9 kg) 143 lb 15.4 oz (65.3 kg) 141 lb 8.6 oz (64.2 kg)    Telemetry    Atrial fibrillation w/ RVR in the low 100s, occasionally in the 120s - Personally Reviewed  ECG    Atrial fibrillation w/ RVR 148 bpm- Personally Reviewed  Physical Exam   GEN: No  acute distress. Elderly female  Neck: No JVD Cardiac: irregularlly irregular, tachy rate, no murmurs, rubs, or gallops.  Respiratory: Clear to auscultation bilaterally. GI: Soft, nontender, non-distended  MS: No edema; No deformity. Neuro:  Nonfocal  Psych: Normal affect   Labs    Chemistry Recent Labs  Lab 08/19/17 0933 08/20/17 0654 08/21/17 0302 08/22/17 0313  NA 139 136 134* 134*  K 4.9 4.2 4.0 3.9  CL 109 101 94* 94*  CO2 21* 21* 26 28  GLUCOSE 257* 132* 199* 293*  BUN 16 18 19  24*  CREATININE 1.12* 1.29* 1.25* 1.47*  CALCIUM 8.8* 8.9 8.6* 7.7*  PROT 7.0  --   --   --   ALBUMIN 3.8  --   --   --   AST 26  --   --   --   ALT 12*  --   --   --   ALKPHOS 141*  --   --   --   BILITOT 0.6  --   --   --   GFRNONAA 44* 37* 39* 32*  GFRAA 52* 43* 45* 37*  ANIONGAP 9 14 14 12      Hematology Recent Labs  Lab 08/19/17 0933 08/20/17 0654 08/21/17 0302  WBC 14.7*  9.7 11.9*  RBC 3.13* 3.23* 3.39*  HGB 9.2* 9.4* 9.8*  HCT 29.5* 30.1* 30.8*  MCV 94.2 93.2 90.9  MCH 29.4 29.1 28.9  MCHC 31.2 31.2 31.8  RDW 14.2 14.2 14.0  PLT 394 382 426*    Cardiac Enzymes Recent Labs  Lab 08/19/17 1816 08/20/17 0020 08/20/17 0654 08/21/17 0302  TROPONINI 0.08* 0.09* 0.09* 0.06*   No results for input(s): TROPIPOC in the last 168 hours.   BNP Recent Labs  Lab 08/19/17 0933  BNP 409.6*     DDimer No results for input(s): DDIMER in the last 168 hours.   Radiology    No results found.  Cardiac Studies   2D Echo 08/20/17  Study Conclusions  - Left ventricle: The cavity size was normal. Wall thickness was normal. Systolic function was mildly reduced. The estimated ejection fraction was in the range of 45% to 50%. There is akinesis of the inferoseptal myocardium. There was no evidence of elevated ventricular filling pressure by Doppler parameters. - Mitral valve: There was moderate regurgitation directed posteriorly. - Left atrium: The atrium was  severely dilated. - Tricuspid valve: There was moderate regurgitation. - Pulmonary arteries: Systolic pressure was moderately increased. PA peak pressure: 57 mm Hg (S).    Patient Profile     Taylor Catapano Priceis a 84 y.o.femalewith a hx of CAD, embolic CVA at time of cath in 2006, HTN, DM, HLD, and recent malignant neoplasm of Lt breast, whom cardiology was initially consulted for elevated troponin on admit, in the setting of weakness, nausea, abd pain diarrheaat the request of Dr. Horris Latino. Cardiology also assisted with acute on chronic systolic CHF. Cardiology now asked to evaluate for atrial fibrillation w/ RVR.   Assessment & Plan    1. Atrial Fibrillation w/ RVR: new diagnosis. Asymptomatic. Rates fluctuating from the upper 90s- low 100s and occasionally in the 120s. BP soft. IV amiodarone initiated by primary care team. Dose of metoprolol also increased to 50 mg BID. TSH normal. K normal at 3.9. Will check Mg level. Echo this admit with mildly reduced EF, 45-50%. CHA2DS2 VASc score is 9 for CHF, HTN, DM, CVA (2), Age >102 (2), female sex and vascular disease. This patients CHA2DS2-VASc Score and unadjusted Ischemic Stroke Rate (% per year) is equal to 12.2 % stroke rate/year from a score of 9. Recommend anticoagulation. She does have chronic anemia, with baseline Hgb ~9, which has remained stable. Recommend Eliquis for stroke prophylaxis. Based on dosing criteria, factoring in age (22), SCr (~1.5) and weight (64 kg), we will opt for 2.5 mg BID. Continue PPI for GI protection. Monitor H/H.   2. Elevated Troponin: minimally elevated with flat low level trend, consistent with demand ischemia. Do no suspect ACS. No plans for further ischemic w/u at this time. Continue medical management.   3. Acute on Chronic Systolic CHF: EF mildly reduced at 45-50%. BNP on admit mildly elevated at 409 on admit. Good response to Lasix. I/Os net negative 5.6 L since admit. Weight down from 152 to 141. Appears  euvolemic on exam, but now in new onset atrial flutter w/ RVR, thus at risk for recurrent exacerbation. Monitor volume status closely, strict I/Os and daily weights. Continue PO Lasix.   3. CAD:  Continue medical management w/ ASA, statin and BB.  For questions or updates, please contact [place] Please consult [email or web address] for contact info under Cardiology/STEMI.     Signed, Lyda Jester, PA-C  08/22/2017, 11:09 AM    Patient seen, examined.  Available data reviewed. Agree with findings, assessment, and plan as outlined by Lyda Jester, PA-C.  On my exam, the patient is alert and oriented, in no distress.  Lungs are clear.  JVP is normal.  Heart is irregularly irregular with no murmur gallop.  Abdomen is soft and nontender.  Extremities have no edema.  The patient has developed atrial fibrillation with RVR overnight.  I would recommend continuing IV amiodarone.  Also continue oral metoprolol.  She is right at the borderline for a dose decrease in apixaban based on criteria outlined above.  I would recommend treating her at the lower dose because of her frailty and what I suspect is fairly high bleeding risk.  Will initiate apixaban 2.5 mg twice daily.  Reviewed rationale for this with the patient today and she is willing to start an oral anticoagulant drug.  As part of our discussion today, she  emphasized her wishes to not have any aggressive interventions  regarding her medical care.  She states "I am ready to go."  She misses her husband very much and looks forward to being with him again.  Sherren Mocha, M.D. 08/22/2017 11:53 AM

## 2017-08-22 NOTE — Progress Notes (Signed)
Pt having low BPs on Cardizem gtt. Provider notified- orders given to d/c Cardizem gtt and begin Amiodarone gtt.   08/22/17 @ 0100-  Amiodarone @ 60 mg/hr, BP has improved. Pt HR 120-130 bpm- provider notified.   Will continue to monitor.

## 2017-08-22 NOTE — Progress Notes (Signed)
CSW met daughter briefly before she had to return to work and provides her with a list of [facility] SNF bed offers so far. She reports she will look at them and give CSW a call if she has questions.   Kathrin Greathouse, Latanya Presser, MSW Clinical Social Worker  [phone] 08/22/2017  11:10 AM

## 2017-08-23 DIAGNOSIS — I5023 Acute on chronic systolic (congestive) heart failure: Secondary | ICD-10-CM

## 2017-08-23 LAB — CBC WITH DIFFERENTIAL/PLATELET
BASOS PCT: 1 %
Basophils Absolute: 0.1 10*3/uL (ref 0.0–0.1)
EOS ABS: 0.2 10*3/uL (ref 0.0–0.7)
Eosinophils Relative: 2 %
HEMATOCRIT: 34.4 % — AB (ref 36.0–46.0)
Hemoglobin: 11.2 g/dL — ABNORMAL LOW (ref 12.0–15.0)
LYMPHS PCT: 31 %
Lymphs Abs: 3 10*3/uL (ref 0.7–4.0)
MCH: 29.5 pg (ref 26.0–34.0)
MCHC: 32.6 g/dL (ref 30.0–36.0)
MCV: 90.5 fL (ref 78.0–100.0)
MONOS PCT: 9 %
Monocytes Absolute: 0.9 10*3/uL (ref 0.1–1.0)
Neutro Abs: 5.5 10*3/uL (ref 1.7–7.7)
Neutrophils Relative %: 57 %
Platelets: 429 10*3/uL — ABNORMAL HIGH (ref 150–400)
RBC: 3.8 MIL/uL — AB (ref 3.87–5.11)
RDW: 14.1 % (ref 11.5–15.5)
WBC: 9.6 10*3/uL (ref 4.0–10.5)

## 2017-08-23 LAB — GLUCOSE, CAPILLARY
Glucose-Capillary: 246 mg/dL — ABNORMAL HIGH (ref 65–99)
Glucose-Capillary: 172 mg/dL — ABNORMAL HIGH (ref 65–99)
Glucose-Capillary: 146 mg/dL — ABNORMAL HIGH (ref 65–99)
Glucose-Capillary: 406 mg/dL — ABNORMAL HIGH (ref 65–99)
Glucose-Capillary: 366 mg/dL — ABNORMAL HIGH (ref 65–99)

## 2017-08-23 LAB — BASIC METABOLIC PANEL
Anion gap: 11 (ref 5–15)
BUN: 28 mg/dL — ABNORMAL HIGH (ref 6–20)
CO2: 29 mmol/L (ref 22–32)
CREATININE: 1.41 mg/dL — AB (ref 0.44–1.00)
Calcium: 8 mg/dL — ABNORMAL LOW (ref 8.9–10.3)
Chloride: 94 mmol/L — ABNORMAL LOW (ref 101–111)
GFR calc non Af Amer: 34 mL/min — ABNORMAL LOW (ref >60)
GFR, EST AFRICAN AMERICAN: 39 mL/min — AB (ref >60)
Glucose, Bld: 297 mg/dL — ABNORMAL HIGH (ref 65–99)
POTASSIUM: 4.6 mmol/L (ref 3.5–5.1)
SODIUM: 134 mmol/L — AB (ref 135–145)

## 2017-08-23 MED ORDER — APIXABAN 2.5 MG PO TABS
2.5000 mg | ORAL_TABLET | Freq: Two times a day (BID) | ORAL | Status: DC
  Administered 2017-08-23 – 2017-08-27 (×9): 2.5 mg via ORAL
  Filled 2017-08-23 (×9): qty 1

## 2017-08-23 MED ORDER — POLYETHYLENE GLYCOL 3350 17 G PO PACK
17.0000 g | PACK | Freq: Every day | ORAL | Status: DC
  Administered 2017-08-23 – 2017-08-27 (×5): 17 g via ORAL
  Filled 2017-08-23 (×4): qty 1

## 2017-08-23 MED ORDER — SODIUM CHLORIDE 0.9 % IV BOLUS
250.0000 mL | Freq: Once | INTRAVENOUS | Status: AC
  Administered 2017-08-23: 250 mL via INTRAVENOUS

## 2017-08-23 MED ORDER — METOPROLOL TARTRATE 12.5 MG HALF TABLET
12.5000 mg | ORAL_TABLET | Freq: Two times a day (BID) | ORAL | Status: DC
  Administered 2017-08-23 – 2017-08-27 (×8): 12.5 mg via ORAL
  Filled 2017-08-23 (×8): qty 1

## 2017-08-23 MED ORDER — METOPROLOL TARTRATE 25 MG PO TABS
25.0000 mg | ORAL_TABLET | Freq: Two times a day (BID) | ORAL | Status: DC
  Administered 2017-08-23: 25 mg via ORAL
  Filled 2017-08-23: qty 1

## 2017-08-23 MED ORDER — INSULIN DETEMIR 100 UNIT/ML ~~LOC~~ SOLN
10.0000 [IU] | Freq: Two times a day (BID) | SUBCUTANEOUS | Status: DC
  Administered 2017-08-23 (×2): 10 [IU] via SUBCUTANEOUS
  Filled 2017-08-23 (×3): qty 0.1

## 2017-08-23 MED ORDER — HEPARIN SODIUM (PORCINE) 5000 UNIT/ML IJ SOLN: 5000.0000 [IU] | Freq: Three times a day (TID) | INTRAMUSCULAR | Status: DC

## 2017-08-23 MED ORDER — INSULIN ASPART 100 UNIT/ML ~~LOC~~ SOLN
0.0000 [IU] | Freq: Three times a day (TID) | SUBCUTANEOUS | Status: DC
  Administered 2017-08-23: 1 [IU] via SUBCUTANEOUS
  Administered 2017-08-24: 2 [IU] via SUBCUTANEOUS
  Administered 2017-08-24 – 2017-08-26 (×2): 3 [IU] via SUBCUTANEOUS
  Administered 2017-08-26: 2 [IU] via SUBCUTANEOUS
  Administered 2017-08-27: 3 [IU] via SUBCUTANEOUS

## 2017-08-23 MED ORDER — INSULIN ASPART 100 UNIT/ML ~~LOC~~ SOLN
2.0000 [IU] | Freq: Three times a day (TID) | SUBCUTANEOUS | Status: DC
  Administered 2017-08-23 – 2017-08-27 (×11): 2 [IU] via SUBCUTANEOUS

## 2017-08-23 NOTE — Progress Notes (Signed)
PT Cancellation Note  Patient Details Name: ARLIE POSCH MRN: [id number] DOB: 11-03-1932   Cancelled Treatment:    Reason Eval/Treat Not Completed: Medical issues which prohibited therapy--low bp. Will hold PT today and check back another day. Also, pt declined participation on today.    Weston Anna, MPT Pager: [phone]

## 2017-08-23 NOTE — Progress Notes (Signed)
Inpatient Diabetes Program Recommendations  AACE/ADA: New Consensus Statement on Inpatient Glycemic Control (2015)  Target Ranges:  Prepandial:   less than 140 mg/dL      Peak postprandial:   less than 180 mg/dL (1-2 hours)      Critically ill patients:  140 - 180 mg/dL   Results for [name], [name] (MRN [id number]) as of 08/23/2017 08:25  Ref. Range 08/22/2017 07:29 08/22/2017 11:37 08/22/2017 16:15 08/22/2017 16:37 08/22/2017 17:12 08/22/2017 21:46  Glucose-Capillary Latest Ref Range: 65 - 99 mg/dL 258 (H)  8 units NOVOLOG +  6 units LEVEMIR at 9am  207 (H)  8 units NOVOLOG  29 (LL) 67 82 293 (H)    6 units LEVEMIR   Results for [name], [name] (MRN [id number]) as of 08/23/2017 08:25  Ref. Range 08/23/2017 07:43  Glucose-Capillary Latest Ref Range: 65 - 99 mg/dL 246 (H)  8 units NOVOLOG     Home DM Meds: Levemir 6 units BID        Novolog TID per SSI   Current Orders: Levemir 10 units BID      Novolog Sensitive Correction Scale/ SSI (0-9 units) TID AC      Novolog 3 units TID with meals     Note fasting glucose levels elevated the last 2 days.  Levemir increased to 10 units BID as a result.  Concern that patient may be receiving too much Novolog as she has had 2 Hypoglycemic events so far since admission after receiving moderate amount of Novolog:  CBG was 53 mg/dl on 05/21 at 6pm after getting 6 units Novolog at 12pm (3 units SSI + 3 units meal coverage)  CBG was 29 mg/dl on 05/23 at 4pm after getting 8 units Novolog at 12pm (5 units SSI + 3 units meal coverage)     MD- Please consider the following in-hospital insulin adjustments:  1. Reduce Novolog Meal Coverage to: Novolog 2 units TID with meals (hold if pt eats <50% of meal)  2. Reduce Novolog SSi to More Sensitive scale- Recommend the following Custom scale:  70-149 mg/dl- 0 units 150-200 mg/dl- 1 unit 201-250 mg/dl- 2 units 251-300 mg/dl- 3 units 301-350 mg/dl- 4 units 351-400 mg/d- 5 units >400  mg/dl- Give 6 units and Call MD    --Will follow patient during hospitalization--  Wyn Quaker RN, MSN, CDE Diabetes Coordinator Inpatient Glycemic Control Team Team Pager: [phone] (8a-5p)

## 2017-08-23 NOTE — Progress Notes (Addendum)
Progress Note  Patient Name: [name] Date of Encounter: 08/23/2017  Primary Cardiologist: Shirlee More, MD   Subjective   Feeling ok. No complaints this morning. Asymptomatic with her afib. Her RN reports that she had hypotension last night and IV amiodarone was stopped. Also had hypoglycemia.   Inpatient Medications    Scheduled Meds: . apixaban  2.5 mg Oral BID  . aspirin EC  81 mg Oral Daily  . escitalopram  10 mg Oral Daily  . furosemide  40 mg Oral Daily  . haloperidol lactate  1 mg Intramuscular Once  . insulin aspart  0-9 Units Subcutaneous TID WC  . insulin aspart  3 Units Subcutaneous TID AC  . insulin detemir  10 Units Subcutaneous BID  . mouth rinse  15 mL Mouth Rinse BID  . metoprolol tartrate  50 mg Oral BID  . pantoprazole  40 mg Oral Daily  . simvastatin  40 mg Oral QHS   Continuous Infusions: . amiodarone 30 mg/hr (08/22/17 0152)   PRN Meds: acetaminophen **OR** acetaminophen, hydrALAZINE, HYDROcodone-acetaminophen, HYDROcodone-acetaminophen, HYDROmorphone (DILAUDID) injection, meperidine (DEMEROL) injection, ondansetron **OR** ondansetron (ZOFRAN) IV, promethazine, temazepam   Vital Signs    Vitals:   08/23/17 0000 08/23/17 0305 08/23/17 0400 08/23/17 0500  BP: 109/85  124/73   Pulse: (!) 117  (!) 122   Resp: (!) 0  15   Temp:  (!) 97.5 F (36.4 C)    TempSrc:  Oral    SpO2: 100%  100%   Weight:    141 lb 8.6 oz (64.2 kg)  Height:        Intake/Output Summary (Last 24 hours) at 08/23/2017 0742 Last data filed at 08/23/2017 0600 Gross per 24 hour  Intake -  Output 1000 ml  Net -1000 ml   Filed Weights   08/21/17 0414 08/22/17 0152 08/23/17 0500  Weight: 143 lb 15.4 oz (65.3 kg) 141 lb 8.6 oz (64.2 kg) 141 lb 8.6 oz (64.2 kg)    Telemetry    Atrial fibrillation in the 140s- Personally Reviewed  ECG    afib 148 bpm 08/21/17 - Personally Reviewed  Physical Exam   GEN: No acute distress. Frail, elderly female  Neck: No  JVD Cardiac: irreguarlly irregular, tachy rate Respiratory: Clear to auscultation bilaterally. GI: Soft, nontender, non-distended  MS: No edema; No deformity. Neuro:  Nonfocal  Psych: Normal affect   Labs    Chemistry Recent Labs  Lab 08/19/17 0933  08/21/17 0302 08/22/17 0313 08/23/17 0303  NA 139   < > 134* 134* 134*  K 4.9   < > 4.0 3.9 4.6  CL 109   < > 94* 94* 94*  CO2 21*   < > 26 28 29   GLUCOSE 257*   < > 199* 293* 297*  BUN 16   < > 19 24* 28*  CREATININE 1.12*   < > 1.25* 1.47* 1.41*  CALCIUM 8.8*   < > 8.6* 7.7* 8.0*  PROT 7.0  --   --   --   --   ALBUMIN 3.8  --   --   --   --   AST 26  --   --   --   --   ALT 12*  --   --   --   --   ALKPHOS 141*  --   --   --   --   BILITOT 0.6  --   --   --   --  GFRNONAA 44*   < > 39* 32* 34*  GFRAA 52*   < > 45* 37* 39*  ANIONGAP 9   < > 14 12 11    < > = values in this interval not displayed.     Hematology Recent Labs  Lab 08/20/17 0654 08/21/17 0302 08/23/17 0303  WBC 9.7 11.9* 9.6  RBC 3.23* 3.39* 3.80*  HGB 9.4* 9.8* 11.2*  HCT 30.1* 30.8* 34.4*  MCV 93.2 90.9 90.5  MCH 29.1 28.9 29.5  MCHC 31.2 31.8 32.6  RDW 14.2 14.0 14.1  PLT 382 426* 429*    Cardiac Enzymes Recent Labs  Lab 08/19/17 1816 08/20/17 0020 08/20/17 0654 08/21/17 0302  TROPONINI 0.08* 0.09* 0.09* 0.06*   No results for input(s): TROPIPOC in the last 168 hours.   BNP Recent Labs  Lab 08/19/17 0933  BNP 409.6*     DDimer No results for input(s): DDIMER in the last 168 hours.   Radiology    No results found.  Cardiac Studies   2D Echo 08/20/17  Study Conclusions  - Left ventricle: The cavity size was normal. Wall thickness was normal. Systolic function was mildly reduced. The estimated ejection fraction was in the range of 45% to 50%. There is akinesis of the inferoseptal myocardium. There was no evidence of elevated ventricular filling pressure by Doppler parameters. - Mitral valve: There was moderate  regurgitation directed posteriorly. - Left atrium: The atrium was severely dilated. - Tricuspid valve: There was moderate regurgitation. - Pulmonary arteries: Systolic pressure was moderately increased. PA peak pressure: 57 mm Hg (S).     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 Horris Latino.Cardiology also assisted with acute on chronic systolic CHF.Cardiology now asked to evaluate for atrial fibrillation w/ RVR.   Assessment & Plan    1. Atrial Fibrillation w/ RVR: new diagnosis this admission. Asymptomatic. TSH normal. K WNL.  Echo 08/19/17 with mildly reduced EF, 45-50%. Rates remain elevated in the 140s. IV Cardizem was stopped last night due to hypotension (after additional 25 mg of metoprolol was given>>dose increased from 25 to 50 mg yesterday). Her BB was also held last night. Pt also hypoglycemic at the time. She has plenty of BP room this am, 150/100. Will restart IV amiodarone with plans to transition to PO. Will reduce metoprolol back down to 25 mg and will monitor BP response. CHA2DS2 VASc score is 9 for CHF, HTN, DM, CVA (2), Age >99 (2), female sex and vascular disease. This patients CHA2DS2-VASc Score and unadjusted Ischemic Stroke Rate (% per year) is equal to 12.2 % stroke rate/year from a score of 9. Anticoagulation therapy with Eliquis started yesterday. Low dose, 2.5 mg BID, based on age, renal function and frailty. Her Hgb is stable. Continue PPI for GI protection to help minimize bleed risk.    2. Elevated Troponin: minimally elevated with flat low level trend, consistent with demand ischemia. Do no suspect ACS. No plans for further ischemic w/u at this time. Continue medical management. Pt voiced that  she does not want any aggressive interventions regarding her medical care.   3. Acute on Chronic Systolic CHF: EF mildly reduced at 45-50%. BNP on admit mildly elevated at 409 on admit.  Good response to Lasix. I/Os net negative 6.6 L since admit. Weight down from 152 to 141. Stable over the past 24 hrs. Appears euvolemic on exam, but now in new onset atrial flutter w/ RVR, thus at risk for recurrent exacerbation. Monitor volume status closely, strict I/Os and daily weights. Continue PO Lasix.   3. CAD:  Continue medical management w/ ASA, statin and BB.   For questions or updates, please contact [place] Please consult [email or web address] for contact info under Cardiology/STEMI.   Signed, Lyda Jester, PA-C  08/23/2017, 7:42 AM    Patient seen, examined. Available data reviewed. Agree with findings, assessment, and plan as outlined by Lyda Jester, PA-C.  On my exam the patient is a very nice elderly woman in no distress.  JVP is normal, lung fields are clear, heart is irregularly irregular and tachycardic without murmur or gallop, abdomen is soft and nontender, extremities have no edema.  Events overnight noted.  The patient had hypotension requiring discontinuation of diltiazem and amiodarone.  She has now been started back on IV amiodarone with ongoing rapid ventricular rates.  We will treat her with IV amiodarone and oral metoprolol.  She is due for a dose of metoprolol now.  Continue apixaban at low dose.  Hopefully she will convert back to sinus rhythm over the weekend on amiodarone.  She clearly expressed to me yesterday that she does not wish to have aggressive interventions done and I am hopeful that she will not require electrical cardioversion.  Sherren Mocha, M.D. 08/23/2017 9:51 AM

## 2017-08-23 NOTE — Progress Notes (Signed)
  Was called by RN regarding hypotension. I've discussed case with Dr. Burt Knack. We do no think that her hypotension is related to IV amio drip.  She may be volume depleted from diuresis. We will hold lasix and will give 250 mL bolus of NaCl, over the course of 60 min. We will resume IV amiodarone at 30 mg/hr. Will further reduce metoprolol dose down to 12.5 mg BID. RN notified of recommendations.   Lyda Jester, PA-C 08/23/2017

## 2017-08-23 NOTE — Progress Notes (Signed)
Pt BP dropped to 70s/40s. Cardiology PA [facility] made aware.

## 2017-08-23 NOTE — Progress Notes (Signed)
PROGRESS NOTE    [name]  ZDG:[id number] DOB: 25-Jan-1933 DOA: 08/19/2017 PCP: Burnard Bunting, MD   Brief Narrative: [name] is a 84 year old female with medical history significant for CAD status post MI, CVA during cardiac cath, hypertension, hyperlipidemia, breast cancer S/P left lumpectomy in 07/2017, DM type II, presents to the ED complaining of nausea/vomiting, watery diarrhea, abdominal pain, shortness of breath/orthopnea/PND, lower extremity edema for the past 2 weeks. She is tatus post lumpectomy, she had also developed presumed mastitis and was started on Bactrim.  Due to the persistent nausea/vomiting at home , patient unable to keep anything down. She denied any fever/chills. In the ED, patient noted to be requiring 4L [place], chest x-ray and CT done showed bilateral pleural effusions, concern for vascular congestion.  CT abdomen pelvis showed no acute intra-abdominal process. Patient was admitted for the management of  CHF exacerbation.  Cardiology consulted.  Started on diuresis.  Patient developed A. fib with RVR.  Currently she is on amiodarone drip. Started on anticoagulation.  Assessment & Plan:   Principal Problem:   Acute on chronic systolic CHF (congestive heart failure) (HCC) Active Problems:   Hyperlipidemia   Coronary artery disease involving native coronary artery of native heart with angina pectoris (HCC)   Cerebral artery occlusion with cerebral infarction ([place])   Malignant neoplasm of upper-outer quadrant of left breast in female, estrogen receptor positive ([place])   H/O: CVA (cerebrovascular accident)   History of myocardial infarction   Diabetes mellitus ([place])   HTN (hypertension)   Acute hypoxemic respiratory failure (HCC)   Nausea and vomiting   Atrial fibrillation with RVR (HCC)  Acute on chronic systolic heart failure: Mild elevated BNP.  Chest x-ray had showed pulmonary vascular congestion.  Echocardiogram showed ejection fraction of 45 to 50%,  PA peak pressure of 57 mmHg.  Cardiology is following.  On oral Lasix daily. Continue input and output measures.Net output of around 6 L since admission.  A. fib with RVR: New onset. On metoprolol  50 mg twice a day.  On amiodarone drip.  Cardiology following.  Continues to be on RVR this morning. BP running low. Patient is not willing for any aggressive measures at this point so she may not be a candidate for electrical cardioversion if needed.  Acute hypoxic respiratory failure: Secondary to CHF exacerbation  Continue supplemental oxygen as needed.  Currently respiratory status stable.  Acute gastroenteritis: Afebrile, no leukocytosis.  GI pathogen panel, C. difficile negative.  Diarrhea has stopped.  CT abdomen/pelvis without any acute changes.  History of recent Bactrim use.  Acute kidney injury: We will continue to monitoron Lasix daily.acute kidney injury might be related  to her hypotensive episodes.  History of coronary disease/elevated troponin: Troponins flat.  EKG without any acute ischemic changes.  Cardiology following.  Denies any chest pain.  Continue ASA, statin, beta-blocker.  History of mastitis: Currently no evidence of infection.  She was on antibiotics before.  Chronic disease: Stable.  Hemoglobin at baseline.    History of hypertension/hyperlipidemia: Continue home meds.  Continue Lopressor,statin.  BP was low last night.    Diabetes mellitus: Continue  current insulin regimen.  Blood sugars noted to be high this morning so increased the dose of Levemir.  Left breast cancer: on tamoxifen  Depression/anxiety: Continue citalopram, temazepam  Confusion/delirium:   Currently alert and oriented.  We will continue to monitor the mental status.  Deconditioning/debility: Patient evaluated by PT and recommended skilled nursing facility on discharge.  Social worker following.    DVT prophylaxis: Hep [place] Code Status: DNR Family Communication: Discussed with daughter  yesterday Disposition Plan: Skilled nursing facility once heart rate stabilizes.   Consultants: Cardiology  Procedures:None  Antimicrobials:None  Subjective: Patient seen and examined at the bedside this morning.  Remains comfortable.  Mental status stable.She is still on  A. fib with RVR.  Blood pressure  running low .  Objective: Vitals:   08/23/17 0500 08/23/17 0800 08/23/17 1056 08/23/17 1059  BP:   (!) 77/38 (!) 80/44  Pulse:   61 89  Resp:   19 17  Temp:  98.4 F (36.9 C)    TempSrc:  Oral    SpO2:   99% 100%  Weight: 64.2 kg (141 lb 8.6 oz)     Height:        Intake/Output Summary (Last 24 hours) at 08/23/2017 1105 Last data filed at 08/23/2017 0600 Gross per 24 hour  Intake -  Output 1000 ml  Net -1000 ml   Filed Weights   08/21/17 0414 08/22/17 0152 08/23/17 0500  Weight: 65.3 kg (143 lb 15.4 oz) 64.2 kg (141 lb 8.6 oz) 64.2 kg (141 lb 8.6 oz)    Examination:  General exam: Appears calm and comfortable ,Not in distress,average built HEENT:PERRL,Oral mucosa moist, Ear/Nose normal on gross exam Respiratory system: Bilateral decreased air entry in the bases Cardiovascular system: A. fib with RVR, no JVD, murmurs, rubs, gallops or clicks. Gastrointestinal system: Abdomen is nondistended, soft and nontender. No organomegaly or masses felt. Normal bowel sounds heard. Central nervous system: Alert and oriented. No focal neurological deficits. Extremities: No edema, no clubbing ,no cyanosis, distal peripheral pulses palpable. Skin: No rashes, lesions or ulcers,no icterus ,no pallor Psychiatry: Judgement and insight appear normal. Mood & affect appropriate.     Data Reviewed: I have personally reviewed following labs and imaging studies  CBC: Recent Labs  Lab 08/19/17 0933 08/20/17 0654 08/21/17 0302 08/23/17 0303  WBC 14.7* 9.7 11.9* 9.6  NEUTROABS  --   --  9.9* 5.5  HGB 9.2* 9.4* 9.8* 11.2*  HCT 29.5* 30.1* 30.8* 34.4*  MCV 94.2 93.2 90.9 90.5  PLT 394  382 426* 425*   Basic Metabolic Panel: Recent Labs  Lab 08/19/17 0933 08/20/17 0654 08/21/17 0302 08/22/17 0313 08/23/17 0303  NA 139 136 134* 134* 134*  K 4.9 4.2 4.0 3.9 4.6  CL 109 101 94* 94* 94*  CO2 21* 21* 26 28 29   GLUCOSE 257* 132* 199* 293* 297*  BUN 16 18 19  24* 28*  CREATININE 1.12* 1.29* 1.25* 1.47* 1.41*  CALCIUM 8.8* 8.9 8.6* 7.7* 8.0*   GFR: Estimated Creatinine Clearance: 27.7 mL/min (A) (by C-G formula based on SCr of 1.41 mg/dL (H)). Liver Function Tests: Recent Labs  Lab 08/19/17 0933  AST 26  ALT 12*  ALKPHOS 141*  BILITOT 0.6  PROT 7.0  ALBUMIN 3.8   Recent Labs  Lab 08/19/17 0933  LIPASE 17   No results for input(s): AMMONIA in the last 168 hours. Coagulation Profile: Recent Labs  Lab 08/20/17 0956  INR 1.18   Cardiac Enzymes: Recent Labs  Lab 08/19/17 1230 08/19/17 1816 08/20/17 0020 08/20/17 0654 08/21/17 0302  TROPONINI 0.05* 0.08* 0.09* 0.09* 0.06*   BNP (last 3 results) No results for input(s): PROBNP in the last 8760 hours. HbA1C: No results for input(s): HGBA1C in the last 72 hours. CBG: Recent Labs  Lab 08/22/17 1615 08/22/17 1637 08/22/17 1712 08/22/17 2146 08/23/17 9563  GLUCAP 29* 67 82 293* 246*   Lipid Profile: No results for input(s): CHOL, HDL, LDLCALC, TRIG, CHOLHDL, LDLDIRECT in the last 72 hours. Thyroid Function Tests: No results for input(s): TSH, T4TOTAL, FREET4, T3FREE, THYROIDAB in the last 72 hours. Anemia Panel: No results for input(s): VITAMINB12, FOLATE, FERRITIN, TIBC, IRON, RETICCTPCT in the last 72 hours. Sepsis Labs: No results for input(s): PROCALCITON, LATICACIDVEN in the last 168 hours.  Recent Results (from the past 240 hour(s))  MRSA PCR Screening     Status: None   Collection Time: 08/19/17 10:48 PM  Result Value Ref Range Status   MRSA by PCR NEGATIVE NEGATIVE Final    Comment:        The GeneXpert MRSA Assay (FDA approved for NASAL specimens only), is one component of  a comprehensive MRSA colonization surveillance program. It is not intended to diagnose MRSA infection nor to guide or monitor treatment for MRSA infections. Performed at [facility], [place] [address] [place], [place] [address]          Radiology Studies: No results found.      Scheduled Meds: . apixaban  2.5 mg Oral BID  . aspirin EC  81 mg Oral Daily  . escitalopram  10 mg Oral Daily  . furosemide  40 mg Oral Daily  . haloperidol lactate  1 mg Intramuscular Once  . insulin aspart  0-5 Units Subcutaneous TID WC  . insulin aspart  2 Units Subcutaneous TID AC  . insulin detemir  10 Units Subcutaneous BID  . mouth rinse  15 mL Mouth Rinse BID  . metoprolol tartrate  25 mg Oral BID  . pantoprazole  40 mg Oral Daily  . polyethylene glycol  17 g Oral Daily  . simvastatin  40 mg Oral QHS   Continuous Infusions: . amiodarone Stopped (08/23/17 1056)     LOS: 4 days    Time spent: 25 mins.More than 50% of that time was spent in counseling and/or coordination of care.      Shelly Coss, MD [facility] Pager [phone]  If 7PM-7AM, please contact night-coverage [email or web address] Password [email or web address] 08/23/2017, 11:05 AM

## 2017-08-24 DIAGNOSIS — I5043 Acute on chronic combined systolic (congestive) and diastolic (congestive) heart failure: Secondary | ICD-10-CM

## 2017-08-24 DIAGNOSIS — I25119 Atherosclerotic heart disease of native coronary artery with unspecified angina pectoris: Secondary | ICD-10-CM

## 2017-08-24 LAB — GLUCOSE, CAPILLARY
Glucose-Capillary: 216 mg/dL — ABNORMAL HIGH (ref 65–99)
Glucose-Capillary: 74 mg/dL (ref 65–99)
Glucose-Capillary: 259 mg/dL — ABNORMAL HIGH (ref 65–99)
Glucose-Capillary: 235 mg/dL — ABNORMAL HIGH (ref 65–99)

## 2017-08-24 MED ORDER — INSULIN DETEMIR 100 UNIT/ML ~~LOC~~ SOLN
12.0000 [IU] | Freq: Two times a day (BID) | SUBCUTANEOUS | Status: DC
  Administered 2017-08-24 – 2017-08-27 (×7): 12 [IU] via SUBCUTANEOUS
  Filled 2017-08-24 (×8): qty 0.12

## 2017-08-24 NOTE — Progress Notes (Signed)
PROGRESS NOTE    [name]  QMG:[id number] DOB: 1932/08/18 DOA: 08/19/2017 PCP: Burnard Bunting, MD   Brief Narrative: [name] is a 84 year old female with medical history significant for CAD status post MI, CVA during cardiac cath, hypertension, hyperlipidemia, breast cancer S/P left lumpectomy in 07/2017, DM type II, presents to the ED complaining of nausea/vomiting, watery diarrhea, abdominal pain, shortness of breath/orthopnea/PND, lower extremity edema for the past 2 weeks. She is tatus post lumpectomy, she had also developed presumed mastitis and was started on Bactrim.  Due to the persistent nausea/vomiting at home , patient unable to keep anything down. She denied any fever/chills. In the ED, patient noted to be requiring 4L [place], chest x-ray and CT done showed bilateral pleural effusions, concern for vascular congestion.  CT abdomen pelvis showed no acute intra-abdominal process. Patient was admitted for the management of  CHF exacerbation.  Cardiology consulted.  Started on diuresis.  Patient developed A. fib with RVR.  Currently she is on amiodarone drip. Started on anticoagulation.  Assessment & Plan:   Principal Problem:   Acute on chronic systolic CHF (congestive heart failure) (HCC) Active Problems:   Hyperlipidemia   Coronary artery disease involving native coronary artery of native heart with angina pectoris (HCC)   Cerebral artery occlusion with cerebral infarction ([place])   Malignant neoplasm of upper-outer quadrant of left breast in female, estrogen receptor positive ([place])   H/O: CVA (cerebrovascular accident)   History of myocardial infarction   Diabetes mellitus ([place])   HTN (hypertension)   Acute hypoxemic respiratory failure (HCC)   Nausea and vomiting   Atrial fibrillation with RVR (HCC)  Acute on chronic systolic heart failure: Mild elevated BNP.  Chest x-ray had showed pulmonary vascular congestion.  Echocardiogram showed ejection fraction of 45 to 50%,  PA peak pressure of 57 mmHg.  Cardiology is following.  On oral Lasix daily. Continue input and output measures.  A. fib with RVR: New onset. On metoprolol  12.5 twice a day.  On amiodarone drip.  Cardiology following.  HR better controlled this morning. BP improved. Patient is not willing for any aggressive measures at this point so she may not be a candidate for electrical cardioversion if needed.  Acute hypoxic respiratory failure: Secondary to CHF exacerbation  Continue supplemental oxygen as needed.  Currently respiratory status stable.  Acute gastroenteritis: Afebrile, no leukocytosis.  GI pathogen panel, C. difficile negative.  Diarrhea has stopped.  CT abdomen/pelvis without any acute changes.  History of recent Bactrim use.  Acute kidney injury: We will continue to monitoron Lasix daily.acute kidney injury might be related  to her hypotensive episodes.We will check BMP tomorrow.  History of coronary disease/elevated troponin: Troponins flat.  EKG without any acute ischemic changes.  Cardiology following.  Denies any chest pain.  Continue ASA, statin, beta-blocker.  History of mastitis: Currently no evidence of infection.  She was on antibiotics before.  Chronic disease: Stable.  Hemoglobin at baseline.    History of hypertension/hyperlipidemia: Continue home meds.  Continue Lopressor,statin.  BP has been acceptable now.  Diabetes mellitus: Continue  current insulin regimen.  Blood sugars noted to be high this morning so increased the dose of Levemir.  Left breast cancer: on tamoxifen  Depression/anxiety: Continue citalopram, temazepam  Confusion/delirium:   Currently alert and oriented.  We will continue to monitor the mental status.  Deconditioning/debility: Patient evaluated by PT and recommended skilled nursing facility on discharge.  Social worker following.  Patient cannot ambulate and uses  scooter for ambulation for the last few years because of severe pain on her bilateral  lower extremities.    DVT prophylaxis: Hep [place] Code Status: DNR Family Communication: None present at the bedside  disposition Plan: Skilled nursing facility once heart rate stabilizes.   Consultants: Cardiology  Procedures:None  Antimicrobials:None  Subjective: Patient seen and examined at the bedside this morning.  Remains comfortable.  Mental status stable.Heart rate and blood pressure have improved today. Objective: Vitals:   08/24/17 0600 08/24/17 0700 08/24/17 0725 08/24/17 0800  BP:    138/68  Pulse: (!) 166 90  (!) 109  Resp: 11 18  14   Temp:   98.5 F (36.9 C)   TempSrc:   Oral   SpO2: 98% 97%  94%  Weight:      Height:        Intake/Output Summary (Last 24 hours) at 08/24/2017 1105 Last data filed at 08/24/2017 0800 Gross per 24 hour  Intake 1333.93 ml  Output 1400 ml  Net -66.07 ml   Filed Weights   08/22/17 0152 08/23/17 0500 08/24/17 0500  Weight: 64.2 kg (141 lb 8.6 oz) 64.2 kg (141 lb 8.6 oz) 64.2 kg (141 lb 8.6 oz)    Examination:  General exam: Appears calm and comfortable ,Not in distress,average built, chronically ill elderly female HEENT:PERRL,Oral mucosa moist, Ear/Nose normal on gross exam Respiratory system: Bilateral equal air entry, normal vesicular breath sounds, no wheezes or crackles  Cardiovascular system: S1 & S2 heard, RRR. No JVD, murmurs, rubs, gallops or clicks. Gastrointestinal system: Abdomen is nondistended, soft and nontender. No organomegaly or masses felt. Normal bowel sounds heard. Central nervous system: Alert and oriented. No focal neurological deficits. Extremities: No edema, no clubbing ,no cyanosis, distal peripheral pulses palpable. Skin: No rashes, lesions or ulcers,no icterus ,no pallor Psychiatry: Judgement and insight appear normal. Mood & affect appropriate.     Data Reviewed: I have personally reviewed following labs and imaging studies  CBC: Recent Labs  Lab 08/19/17 0933 08/20/17 0654 08/21/17 0302  08/23/17 0303  WBC 14.7* 9.7 11.9* 9.6  NEUTROABS  --   --  9.9* 5.5  HGB 9.2* 9.4* 9.8* 11.2*  HCT 29.5* 30.1* 30.8* 34.4*  MCV 94.2 93.2 90.9 90.5  PLT 394 382 426* 196*   Basic Metabolic Panel: Recent Labs  Lab 08/19/17 0933 08/20/17 0654 08/21/17 0302 08/22/17 0313 08/23/17 0303  NA 139 136 134* 134* 134*  K 4.9 4.2 4.0 3.9 4.6  CL 109 101 94* 94* 94*  CO2 21* 21* 26 28 29   GLUCOSE 257* 132* 199* 293* 297*  BUN 16 18 19  24* 28*  CREATININE 1.12* 1.29* 1.25* 1.47* 1.41*  CALCIUM 8.8* 8.9 8.6* 7.7* 8.0*   GFR: Estimated Creatinine Clearance: 27.7 mL/min (A) (by C-G formula based on SCr of 1.41 mg/dL (H)). Liver Function Tests: Recent Labs  Lab 08/19/17 0933  AST 26  ALT 12*  ALKPHOS 141*  BILITOT 0.6  PROT 7.0  ALBUMIN 3.8   Recent Labs  Lab 08/19/17 0933  LIPASE 17   No results for input(s): AMMONIA in the last 168 hours. Coagulation Profile: Recent Labs  Lab 08/20/17 0956  INR 1.18   Cardiac Enzymes: Recent Labs  Lab 08/19/17 1230 08/19/17 1816 08/20/17 0020 08/20/17 0654 08/21/17 0302  TROPONINI 0.05* 0.08* 0.09* 0.09* 0.06*   BNP (last 3 results) No results for input(s): PROBNP in the last 8760 hours. HbA1C: No results for input(s): HGBA1C in the last 72 hours. CBG: Recent  Labs  Lab 08/23/17 1123 08/23/17 1605 08/23/17 2114 08/23/17 2155 08/24/17 0733  GLUCAP 172* 146* 406* 366* 216*   Lipid Profile: No results for input(s): CHOL, HDL, LDLCALC, TRIG, CHOLHDL, LDLDIRECT in the last 72 hours. Thyroid Function Tests: No results for input(s): TSH, T4TOTAL, FREET4, T3FREE, THYROIDAB in the last 72 hours. Anemia Panel: No results for input(s): VITAMINB12, FOLATE, FERRITIN, TIBC, IRON, RETICCTPCT in the last 72 hours. Sepsis Labs: No results for input(s): PROCALCITON, LATICACIDVEN in the last 168 hours.  Recent Results (from the past 240 hour(s))  MRSA PCR Screening     Status: None   Collection Time: 08/19/17 10:48 PM  Result  Value Ref Range Status   MRSA by PCR NEGATIVE NEGATIVE Final    Comment:        The GeneXpert MRSA Assay (FDA approved for NASAL specimens only), is one component of a comprehensive MRSA colonization surveillance program. It is not intended to diagnose MRSA infection nor to guide or monitor treatment for MRSA infections. Performed at [facility], [place] [address], [place], [place] [address]          Radiology Studies: No results found.      Scheduled Meds: . apixaban  2.5 mg Oral BID  . escitalopram  10 mg Oral Daily  . haloperidol lactate  1 mg Intramuscular Once  . insulin aspart  0-5 Units Subcutaneous TID WC  . insulin aspart  2 Units Subcutaneous TID AC  . insulin detemir  12 Units Subcutaneous BID  . mouth rinse  15 mL Mouth Rinse BID  . metoprolol tartrate  12.5 mg Oral BID  . pantoprazole  40 mg Oral Daily  . polyethylene glycol  17 g Oral Daily  . simvastatin  40 mg Oral QHS   Continuous Infusions: . amiodarone 30 mg/hr (08/24/17 0730)     LOS: 5 days    Time spent: 25 mins.More than 50% of that time was spent in counseling and/or coordination of care.      Shelly Coss, MD [facility] Pager [phone]  If 7PM-7AM, please contact night-coverage [email or web address] Password [email or web address] 08/24/2017, 11:05 AM

## 2017-08-24 NOTE — Progress Notes (Signed)
Progress Note  Patient Name: [name] Date of Encounter: 08/24/2017  Primary Cardiologist: Dr. Shirlee More  Subjective   Reports no palpitations or chest pain.  Yesterday without difficulty, constipated but no abdominal pain.  No shortness of breath at rest  Inpatient Medications    Scheduled Meds: . apixaban  2.5 mg Oral BID  . aspirin EC  81 mg Oral Daily  . escitalopram  10 mg Oral Daily  . haloperidol lactate  1 mg Intramuscular Once  . insulin aspart  0-5 Units Subcutaneous TID WC  . insulin aspart  2 Units Subcutaneous TID AC  . insulin detemir  10 Units Subcutaneous BID  . mouth rinse  15 mL Mouth Rinse BID  . metoprolol tartrate  12.5 mg Oral BID  . pantoprazole  40 mg Oral Daily  . polyethylene glycol  17 g Oral Daily  . simvastatin  40 mg Oral QHS   Continuous Infusions: . amiodarone 30 mg/hr (08/23/17 1948)   PRN Meds: acetaminophen **OR** acetaminophen, hydrALAZINE, HYDROcodone-acetaminophen, HYDROcodone-acetaminophen, HYDROmorphone (DILAUDID) injection, meperidine (DEMEROL) injection, ondansetron **OR** ondansetron (ZOFRAN) IV, promethazine, temazepam   Vital Signs    Vitals:   08/24/17 0000 08/24/17 0100 08/24/17 0400 08/24/17 0500  BP: (!) 98/57  98/63   Pulse: (!) 59 90 85 90  Resp: 16 13 (!) 25 15  Temp:      TempSrc:      SpO2: (!) 88% 93% 95% 93%  Weight:    141 lb 8.6 oz (64.2 kg)  Height:        Intake/Output Summary (Last 24 hours) at 08/24/2017 0740 Last data filed at 08/24/2017 0600 Gross per 24 hour  Intake 1333.93 ml  Output 1000 ml  Net 333.93 ml   Filed Weights   08/22/17 0152 08/23/17 0500 08/24/17 0500  Weight: 141 lb 8.6 oz (64.2 kg) 141 lb 8.6 oz (64.2 kg) 141 lb 8.6 oz (64.2 kg)    Telemetry    Atrial fibrillation.  Personally reviewed.  Physical Exam   GEN:  Elderly woman, no acute distress.   Neck: No JVD. Cardiac:  Irregularly irregular, no gallop.  Respiratory: Nonlabored. Clear to auscultation  bilaterally. GI: Soft, nontender, bowel sounds present. MS: No edema; No deformity. Neuro:  Nonfocal. Psych: Alert and oriented x 3. Normal affect.  Labs    Chemistry Recent Labs  Lab 08/19/17 0933  08/21/17 0302 08/22/17 0313 08/23/17 0303  NA 139   < > 134* 134* 134*  K 4.9   < > 4.0 3.9 4.6  CL 109   < > 94* 94* 94*  CO2 21*   < > 26 28 29   GLUCOSE 257*   < > 199* 293* 297*  BUN 16   < > 19 24* 28*  CREATININE 1.12*   < > 1.25* 1.47* 1.41*  CALCIUM 8.8*   < > 8.6* 7.7* 8.0*  PROT 7.0  --   --   --   --   ALBUMIN 3.8  --   --   --   --   AST 26  --   --   --   --   ALT 12*  --   --   --   --   ALKPHOS 141*  --   --   --   --   BILITOT 0.6  --   --   --   --   GFRNONAA 44*   < > 39* 32* 34*  GFRAA 52*   < >  45* 37* 39*  ANIONGAP 9   < > 14 12 11    < > = values in this interval not displayed.     Hematology Recent Labs  Lab 08/20/17 0654 08/21/17 0302 08/23/17 0303  WBC 9.7 11.9* 9.6  RBC 3.23* 3.39* 3.80*  HGB 9.4* 9.8* 11.2*  HCT 30.1* 30.8* 34.4*  MCV 93.2 90.9 90.5  MCH 29.1 28.9 29.5  MCHC 31.2 31.8 32.6  RDW 14.2 14.0 14.1  PLT 382 426* 429*    Cardiac Enzymes Recent Labs  Lab 08/19/17 1816 08/20/17 0020 08/20/17 0654 08/21/17 0302  TROPONINI 0.08* 0.09* 0.09* 0.06*   No results for input(s): TROPIPOC in the last 168 hours.   BNP Recent Labs  Lab 08/19/17 0933  BNP 409.6*     Radiology    No results found.  Cardiac Studies   Echocardiogram 08/20/2017: Study Conclusions  - Left ventricle: The cavity size was normal. Wall thickness was   normal. Systolic function was mildly reduced. The estimated   ejection fraction was in the range of 45% to 50%. There is   akinesis of the inferoseptal myocardium. There was no evidence of   elevated ventricular filling pressure by Doppler parameters. - Mitral valve: There was moderate regurgitation directed   posteriorly. - Left atrium: The atrium was severely dilated. - Tricuspid valve: There  was moderate regurgitation. - Pulmonary arteries: Systolic pressure was moderately increased.   PA peak pressure: 57 mm Hg (S).  Patient Profile     84 y.o. female with history of CAD, and following stroke at time of cardiac catheterization in 2006, hypertension, type, hyperlipidemia, left breast cancer.  She is admitted with weakness associated with abdominal pain, nausea, and diarrhea.  Cardiology service following secondary to elevated troponin I, acute on chronic combined heart failure, and persistent atrial fibrillation  Assessment & Plan    1.  Persistent atrial fibrillation, initially with RVR and newly diagnosed.  Hypotension has limited therapy, but at this point she is tolerating IV amiodarone and low-dose metoprolol 12.5 mg twice daily.  CHADSVASC score is 9 and she is on Eliquis for stroke prophylaxis.  2.  History of CAD, no active angina symptoms.  She is on statin therapy.  Stopping aspirin in light of initiation of Eliquis.  3.  Acute on chronic combined heart failure, LVEF 45 to 50%.  She has had good diuresis during hospital stay, and present Lasix has been discontinued in light of relative hypotension and bump in creatinine.  4.  Minor elevation in troponin I in flat pattern consistent with demand ischemia and/or heart failure rather than ACS.  I reviewed the chart and recommendations by Dr. Burt Knack.  Plan has been continuation of amiodarone through the weekend in hopes that she will convert back to sinus rhythm, otherwise continue beta-blocker and Eliquis.  If not, likely convert to oral amiodarone load and uptitrate beta-blocker as able for heart rate control.  Elective cardioversion could ultimately be considered once she is back to baseline.  Back ECG and BMET in a.m.  Signed, Rozann Lesches, MD  08/24/2017, 7:40 AM

## 2017-08-24 NOTE — Discharge Instructions (Signed)

## 2017-08-25 LAB — BASIC METABOLIC PANEL
ANION GAP: 10 (ref 5–15)
BUN: 25 mg/dL — AB (ref 6–20)
CHLORIDE: 96 mmol/L — AB (ref 101–111)
CO2: 29 mmol/L (ref 22–32)
Calcium: 8.2 mg/dL — ABNORMAL LOW (ref 8.9–10.3)
Creatinine, Ser: 1.25 mg/dL — ABNORMAL HIGH (ref 0.44–1.00)
GFR calc non Af Amer: 39 mL/min — ABNORMAL LOW (ref >60)
GFR, EST AFRICAN AMERICAN: 45 mL/min — AB (ref >60)
Glucose, Bld: 137 mg/dL — ABNORMAL HIGH (ref 65–99)
Potassium: 4.4 mmol/L (ref 3.5–5.1)
Sodium: 135 mmol/L (ref 135–145)

## 2017-08-25 LAB — GLUCOSE, CAPILLARY
Glucose-Capillary: 117 mg/dL — ABNORMAL HIGH (ref 65–99)
Glucose-Capillary: 80 mg/dL (ref 65–99)
Glucose-Capillary: 124 mg/dL — ABNORMAL HIGH (ref 65–99)

## 2017-08-25 MED ORDER — ATORVASTATIN CALCIUM 10 MG PO TABS
20.0000 mg | ORAL_TABLET | Freq: Every day | ORAL | Status: DC
  Administered 2017-08-25 – 2017-08-26 (×2): 20 mg via ORAL
  Filled 2017-08-25 (×2): qty 2

## 2017-08-25 MED ORDER — AMIODARONE HCL 200 MG PO TABS
200.0000 mg | ORAL_TABLET | Freq: Two times a day (BID) | ORAL | Status: DC
  Administered 2017-08-25 – 2017-08-27 (×5): 200 mg via ORAL
  Filled 2017-08-25 (×5): qty 1

## 2017-08-25 MED ORDER — BISACODYL 5 MG PO TBEC
5.0000 mg | DELAYED_RELEASE_TABLET | Freq: Every day | ORAL | Status: DC | PRN
Start: 2017-08-25 — End: 2017-08-27
  Administered 2017-08-26: 5 mg via ORAL
  Filled 2017-08-25: qty 1

## 2017-08-25 NOTE — Progress Notes (Signed)
The order for simvastatin(Zocor) was changed to an equivalent dose of atorvastatin(Lipitor) due to the potential drug interaction with amiodarone.  When taken in combination with medications that inhibit its metabolism, simvastatin can accumulate which increases the risk of liver toxicity, myopathy, or rhabdomyolysis.  Simvastatin dose should not exceed 10mg /day in patients taking verapamil, diltiazem, fibrates, or niacin >or= 1g/day.   Simvastatin dose should not exceed 20mg /day in patients taking amlodipine, ranolazine or amiodarone.   Please consider this potential interaction at discharge.  Dorrene German 08/25/2017 8:16 AM

## 2017-08-25 NOTE — Progress Notes (Addendum)
PROGRESS NOTE    [name]  ZJQ:[id number] DOB: 1933/01/19 DOA: 08/19/2017 PCP: Burnard Bunting, MD   Brief Narrative: [name] is a 84 year old female with medical history significant for CAD status post MI, CVA during cardiac cath, hypertension, hyperlipidemia, breast cancer S/P left lumpectomy in 07/2017, DM type II, presents to the ED complaining of nausea/vomiting, watery diarrhea, abdominal pain, shortness of breath/orthopnea/PND, lower extremity edema for the past 2 weeks. She is tatus post lumpectomy, she had also developed presumed mastitis and was started on Bactrim.  Due to the persistent nausea/vomiting at home , patient unable to keep anything down. She denied any fever/chills. In the ED, patient noted to be requiring 4L [place], chest x-ray and CT done showed bilateral pleural effusions, concern for vascular congestion.  CT abdomen pelvis showed no acute intra-abdominal process. Patient was admitted for the management of  CHF exacerbation.  Cardiology consulted.  Started on diuresis.  Patient developed A. fib with RVR.  Currently she is on amiodarone drip. Started on anticoagulation.  Assessment & Plan:   Principal Problem:   Acute on chronic systolic CHF (congestive heart failure) (HCC) Active Problems:   Hyperlipidemia   Coronary artery disease involving native coronary artery of native heart with angina pectoris (HCC)   Cerebral artery occlusion with cerebral infarction ([place])   Malignant neoplasm of upper-outer quadrant of left breast in female, estrogen receptor positive ([place])   H/O: CVA (cerebrovascular accident)   History of myocardial infarction   Diabetes mellitus ([place])   HTN (hypertension)   Acute hypoxemic respiratory failure (HCC)   Nausea and vomiting   Atrial fibrillation with RVR (HCC)  Acute on chronic systolic heart failure: Mild elevated BNP.  Chest x-ray had showed pulmonary vascular congestion.  Echocardiogram showed ejection fraction of 45 to 50%,  PA peak pressure of 57 mmHg.  Cardiology is following.  On oral Lasix daily. Continue input and output measures.  A. fib with RVR: New onset. On metoprolol  12.5 twice a day.  On amiodarone drip.  Cardiology following.  HR better controlled this morning. BP improved.  Amiodarone changed to oral.  Acute hypoxic respiratory failure: Secondary to CHF exacerbation  Continue supplemental oxygen as needed.  Currently respiratory status stable.  Acute gastroenteritis: Afebrile, no leukocytosis.  GI pathogen panel, C. difficile negative.  Diarrhea has stopped.  CT abdomen/pelvis without any acute changes.  History of recent Bactrim use.  Acute kidney injury: We will continue to monitor.On Lasix daily.acute kidney injury might be related  to her hypotensive episodes.  Kidney function close to baseline now.  History of coronary disease/elevated troponin: Troponins flat.  EKG without any acute ischemic changes.  Cardiology following.  Denies any chest pain.  Continue ASA, statin, beta-blocker.  History of mastitis: Currently no evidence of infection.  She was on antibiotics before.  Chronic disease: Stable.  Hemoglobin at baseline.    History of hypertension/hyperlipidemia: Continue home meds.  Continue Lopressor,statin.  BP has been acceptable now.  Diabetes mellitus: Continue  current insulin regimen.  Blood sugars noted to be high this morning so increased the dose of Levemir.  Left breast cancer: on tamoxifen  Depression/anxiety: Continue citalopram, temazepam  Confusion/delirium:   Currently alert and oriented.  We will continue to monitor the mental status.  Deconditioning/debility: Patient evaluated by PT and recommended skilled nursing facility on discharge.  Social worker following.  Patient cannot ambulate and uses scooter for ambulation for the last few years because of severe pain on her bilateral  lower extremities.    DVT prophylaxis: Hep  Code Status: DNR Family Communication:  None present at the bedside  disposition Plan: Skilled nursing facility as soon as the bed is available Consultants: Cardiology  Procedures:None  Antimicrobials:None  Subjective: Patient seen and examined at the bedside this morning.  Remains comfortable.  Mental status stable.Heart rate and blood pressure have improved today.  Objective:  Vitals:   08/25/17 0800 08/25/17 0900 08/25/17 1000 08/25/17 1100  BP:  (!) 129/112 (!) 119/35 (!) 132/48  Pulse:  79 71 72  Resp:  14 12 16   Temp: 98.5 F (36.9 C)     TempSrc: Oral     SpO2:  95% 100% 100%  Weight:      Height:        Intake/Output Summary (Last 24 hours) at 08/25/2017 1236 Last data filed at 08/25/2017 1100 Gross per 24 hour  Intake 476.73 ml  Output 1500 ml  Net -1023.27 ml   Filed Weights   08/23/17 0500 08/24/17 0500 08/25/17 0500  Weight: 64.2 kg (141 lb 8.6 oz) 64.2 kg (141 lb 8.6 oz) 65 kg (143 lb 4.8 oz)    Examination:  General exam: Appears calm and comfortable ,Not in distress,average built HEENT:PERRL,Oral mucosa moist, Ear/Nose normal on gross exam Respiratory system: Bilateral equal air entry, normal vesicular breath sounds, no wheezes or crackles  Cardiovascular system: Afib,S1 & S2 heard, No JVD, murmurs, rubs, gallops or clicks. Gastrointestinal system: Abdomen is nondistended, soft and nontender. No organomegaly or masses felt. Normal bowel sounds heard. Central nervous system: Alert and oriented. No focal neurological deficits. Extremities: No edema, no clubbing ,no cyanosis, distal peripheral pulses palpable. Skin: No rashes, lesions or ulcers,no icterus ,no pallor    Data Reviewed: I have personally reviewed following labs and imaging studies  CBC: Recent Labs  Lab 08/19/17 0933 08/20/17 0654 08/21/17 0302 08/23/17 0303  WBC 14.7* 9.7 11.9* 9.6  NEUTROABS  --   --  9.9* 5.5  HGB 9.2* 9.4* 9.8* 11.2*  HCT 29.5* 30.1* 30.8* 34.4*  MCV 94.2 93.2 90.9 90.5  PLT 394 382 426* 429*    Basic Metabolic Panel: Recent Labs  Lab 08/20/17 0654 08/21/17 0302 08/22/17 0313 08/23/17 0303 08/25/17 0316  NA 136 134* 134* 134* 135  K 4.2 4.0 3.9 4.6 4.4  CL 101 94* 94* 94* 96*  CO2 21* 26 28 29 29   GLUCOSE 132* 199* 293* 297* 137*  BUN 18 19 24* 28* 25*  CREATININE 1.29* 1.25* 1.47* 1.41* 1.25*  CALCIUM 8.9 8.6* 7.7* 8.0* 8.2*   GFR: Estimated Creatinine Clearance: 31.2 mL/min (A) (by C-G formula based on SCr of 1.25 mg/dL (H)). Liver Function Tests: Recent Labs  Lab 08/19/17 0933  AST 26  ALT 12*  ALKPHOS 141*  BILITOT 0.6  PROT 7.0  ALBUMIN 3.8   Recent Labs  Lab 08/19/17 0933  LIPASE 17   No results for input(s): AMMONIA in the last 168 hours. Coagulation Profile: Recent Labs  Lab 08/20/17 0956  INR 1.18   Cardiac Enzymes: Recent Labs  Lab 08/19/17 1230 08/19/17 1816 08/20/17 0020 08/20/17 0654 08/21/17 0302  TROPONINI 0.05* 0.08* 0.09* 0.09* 0.06*   BNP (last 3 results) No results for input(s): PROBNP in the last 8760 hours. HbA1C: No results for input(s): HGBA1C in the last 72 hours. CBG: Recent Labs  Lab 08/24/17 0733 08/24/17 1105 08/24/17 1607 08/24/17 2126 08/25/17 0733  GLUCAP 216* 74 259* 235* 117*   Lipid Profile: No results for  input(s): CHOL, HDL, LDLCALC, TRIG, CHOLHDL, LDLDIRECT in the last 72 hours. Thyroid Function Tests: No results for input(s): TSH, T4TOTAL, FREET4, T3FREE, THYROIDAB in the last 72 hours. Anemia Panel: No results for input(s): VITAMINB12, FOLATE, FERRITIN, TIBC, IRON, RETICCTPCT in the last 72 hours. Sepsis Labs: No results for input(s): PROCALCITON, LATICACIDVEN in the last 168 hours.  Recent Results (from the past 240 hour(s))  MRSA PCR Screening     Status: None   Collection Time: 08/19/17 10:48 PM  Result Value Ref Range Status   MRSA by PCR NEGATIVE NEGATIVE Final    Comment:        The GeneXpert MRSA Assay (FDA approved for NASAL specimens only), is one component of  a comprehensive MRSA colonization surveillance program. It is not intended to diagnose MRSA infection nor to guide or monitor treatment for MRSA infections. Performed at [facility], [place] [address] [place], [place] [id number]          Radiology Studies: No results found.      Scheduled Meds: . amiodarone  200 mg Oral BID  . apixaban  2.5 mg Oral BID  . atorvastatin  20 mg Oral QHS  . escitalopram  10 mg Oral Daily  . haloperidol lactate  1 mg Intramuscular Once  . insulin aspart  0-5 Units Subcutaneous TID WC  . insulin aspart  2 Units Subcutaneous TID AC  . insulin detemir  12 Units Subcutaneous BID  . mouth rinse  15 mL Mouth Rinse BID  . metoprolol tartrate  12.5 mg Oral BID  . pantoprazole  40 mg Oral Daily  . polyethylene glycol  17 g Oral Daily   Continuous Infusions:    LOS: 6 days    Time spent: 25 mins.More than 50% of that time was spent in counseling and/or coordination of care.      Shelly Coss, MD [facility] Pager [phone]  If 7PM-7AM, please contact night-coverage [email or web address] Password [facility] 08/25/2017, 12:36 PM

## 2017-08-25 NOTE — Social Work (Addendum)
CSW spoke with daughter and we discussed SNF offers. Pt accepts bed from [facility] since there are very few options. CSW will f/u to confirm SNF bed with facility.  Insurance Auth will need to be obtained for placement and that cannot be obtained on the weekend.  CSW will continue to follow up.  1:18pm SNF confirmed bed offer. CSW will initiate insurance auth on next weekday as Insurance auth unable to be obtained on weekends.  Elissa Hefty, [place] Clinical Social Worker-Weekend [place] [phone]

## 2017-08-25 NOTE — Progress Notes (Signed)
Progress Note  Patient Name: [name] Date of Encounter: 08/25/2017  Primary Cardiologist: Dr. Shirlee More  Subjective   No palpitations today, no chest pain or shortness of breath.  No abdominal pain.  Inpatient Medications    Scheduled Meds: . apixaban  2.5 mg Oral BID  . escitalopram  10 mg Oral Daily  . haloperidol lactate  1 mg Intramuscular Once  . insulin aspart  0-5 Units Subcutaneous TID WC  . insulin aspart  2 Units Subcutaneous TID AC  . insulin detemir  12 Units Subcutaneous BID  . mouth rinse  15 mL Mouth Rinse BID  . metoprolol tartrate  12.5 mg Oral BID  . pantoprazole  40 mg Oral Daily  . polyethylene glycol  17 g Oral Daily  . simvastatin  40 mg Oral QHS   Continuous Infusions: . amiodarone 30 mg/hr (08/25/17 0200)   PRN Meds: acetaminophen **OR** acetaminophen, hydrALAZINE, HYDROcodone-acetaminophen, HYDROcodone-acetaminophen, HYDROmorphone (DILAUDID) injection, meperidine (DEMEROL) injection, ondansetron **OR** ondansetron (ZOFRAN) IV, temazepam   Vital Signs    Vitals:   08/25/17 0309 08/25/17 0400 08/25/17 0500 08/25/17 0600  BP:  (!) 128/51 (!) 133/46 (!) 130/49  Pulse:  72 71 74  Resp:  (!) 9 10 10   Temp: 98.5 F (36.9 C)     TempSrc: Oral     SpO2:  90% (!) 88% 91%  Weight:   143 lb 4.8 oz (65 kg)   Height:        Intake/Output Summary (Last 24 hours) at 08/25/2017 0802 Last data filed at 08/25/2017 0400 Gross per 24 hour  Intake 364 ml  Output 1200 ml  Net -836 ml   Filed Weights   08/23/17 0500 08/24/17 0500 08/25/17 0500  Weight: 141 lb 8.6 oz (64.2 kg) 141 lb 8.6 oz (64.2 kg) 143 lb 4.8 oz (65 kg)    Telemetry    Sinus rhythm.  Personally reviewed.  ECG    Tracing from 08/25/2017 shows sinus rhythm with nonspecific ST changes.  Personally reviewed.  Physical Exam   GEN:  Elderly woman, no acute distress.   Neck: No JVD. Cardiac: RRR, no gallop.  Respiratory: Nonlabored. Clear to auscultation bilaterally. GI:  Soft, nontender, bowel sounds present. MS: No edema; No deformity. Neuro:  Nonfocal. Psych: Alert and oriented x 2. Normal affect.  Labs    Chemistry Recent Labs  Lab 08/19/17 0933  08/22/17 0313 08/23/17 0303 08/25/17 0316  NA 139   < > 134* 134* 135  K 4.9   < > 3.9 4.6 4.4  CL 109   < > 94* 94* 96*  CO2 21*   < > 28 29 29   GLUCOSE 257*   < > 293* 297* 137*  BUN 16   < > 24* 28* 25*  CREATININE 1.12*   < > 1.47* 1.41* 1.25*  CALCIUM 8.8*   < > 7.7* 8.0* 8.2*  PROT 7.0  --   --   --   --   ALBUMIN 3.8  --   --   --   --   AST 26  --   --   --   --   ALT 12*  --   --   --   --   ALKPHOS 141*  --   --   --   --   BILITOT 0.6  --   --   --   --   GFRNONAA 44*   < > 32* 34* 39*  GFRAA  52*   < > 37* 39* 45*  ANIONGAP 9   < > 12 11 10    < > = values in this interval not displayed.     Hematology Recent Labs  Lab 08/20/17 0654 08/21/17 0302 08/23/17 0303  WBC 9.7 11.9* 9.6  RBC 3.23* 3.39* 3.80*  HGB 9.4* 9.8* 11.2*  HCT 30.1* 30.8* 34.4*  MCV 93.2 90.9 90.5  MCH 29.1 28.9 29.5  MCHC 31.2 31.8 32.6  RDW 14.2 14.0 14.1  PLT 382 426* 429*    Cardiac Enzymes Recent Labs  Lab 08/19/17 1816 08/20/17 0020 08/20/17 0654 08/21/17 0302  TROPONINI 0.08* 0.09* 0.09* 0.06*   No results for input(s): TROPIPOC in the last 168 hours.   BNP Recent Labs  Lab 08/19/17 0933  BNP 409.6*     Radiology    No results found.  Cardiac Studies   Echocardiogram 08/20/2017: Study Conclusions  - Left ventricle: The cavity size was normal. Wall thickness was normal. Systolic function was mildly reduced. The estimated ejection fraction was in the range of 45% to 50%. There is akinesis of the inferoseptal myocardium. There was no evidence of elevated ventricular filling pressure by Doppler parameters. - Mitral valve: There was moderate regurgitation directed posteriorly. - Left atrium: The atrium was severely dilated. - Tricuspid valve: There was moderate  regurgitation. - Pulmonary arteries: Systolic pressure was moderately increased. PA peak pressure: 57 mm Hg (S).  Patient Profile     84 y.o. female with history of CAD, and following stroke at time of cardiac catheterization in 2006, hypertension, type, hyperlipidemia, left breast cancer.  She is admitted with weakness associated with abdominal pain, nausea, and diarrhea. Cardiology service following secondary to elevated troponin I, acute on chronic combined heart failure, and persistent atrial fibrillation.  Assessment & Plan    1.  Paroxysmal to persistent atrial fibrillation, initially with RVR and newly diagnosed.  CHADSVASC score is 9 and she is currently on Eliquis for stroke prophylaxis.  She has converted to sinus rhythm on IV amiodarone and low-dose Lopressor.  2.  CAD without active angina symptoms.  She is on statin therapy.  Aspirin discontinued with use of Eliquis.  3.  Minor elevation in troponin I, flat pattern suggestive of demand ischemia and/or heart failure rather than ACS.  4.  Acute on chronic combined heart failure, LVEF 25 to 50%.  Lasix has been discontinued.  Continue Lopressor and Eliquis.  Will stop IV amiodarone and convert to low-dose oral load at 200 mg twice daily for now.   Signed, Rozann Lesches, MD  08/25/2017, 8:02 AM

## 2017-08-26 DIAGNOSIS — J9601 Acute respiratory failure with hypoxia: Secondary | ICD-10-CM

## 2017-08-26 DIAGNOSIS — I10 Essential (primary) hypertension: Secondary | ICD-10-CM

## 2017-08-26 DIAGNOSIS — I6350 Cerebral infarction due to unspecified occlusion or stenosis of unspecified cerebral artery: Secondary | ICD-10-CM

## 2017-08-26 DIAGNOSIS — I252 Old myocardial infarction: Secondary | ICD-10-CM

## 2017-08-26 DIAGNOSIS — Z8673 Personal history of transient ischemic attack (TIA), and cerebral infarction without residual deficits: Secondary | ICD-10-CM

## 2017-08-26 LAB — GLUCOSE, CAPILLARY
Glucose-Capillary: 60 mg/dL — ABNORMAL LOW (ref 65–99)
Glucose-Capillary: 154 mg/dL — ABNORMAL HIGH (ref 65–99)
Glucose-Capillary: 294 mg/dL — ABNORMAL HIGH (ref 65–99)
Glucose-Capillary: 230 mg/dL — ABNORMAL HIGH (ref 65–99)
Glucose-Capillary: 199 mg/dL — ABNORMAL HIGH (ref 65–99)

## 2017-08-26 MED ORDER — AMLODIPINE BESYLATE 5 MG PO TABS
2.5000 mg | ORAL_TABLET | Freq: Every day | ORAL | Status: DC
  Administered 2017-08-26 – 2017-08-27 (×2): 2.5 mg via ORAL
  Filled 2017-08-26 (×2): qty 1

## 2017-08-26 MED ORDER — BISACODYL 10 MG RE SUPP
10.0000 mg | Freq: Every day | RECTAL | Status: DC | PRN
  Administered 2017-08-27: 10 mg via RECTAL
  Filled 2017-08-26: qty 1

## 2017-08-26 NOTE — Progress Notes (Addendum)
Progress Note  Patient Name: [name] Date of Encounter: 08/26/2017  Primary Cardiologist: Dr. Shirlee More  Subjective   No palpitations today, feel comfortable.  Inpatient Medications    Scheduled Meds: . amiodarone  200 mg Oral BID  . apixaban  2.5 mg Oral BID  . atorvastatin  20 mg Oral QHS  . escitalopram  10 mg Oral Daily  . haloperidol lactate  1 mg Intramuscular Once  . insulin aspart  0-5 Units Subcutaneous TID WC  . insulin aspart  2 Units Subcutaneous TID AC  . insulin detemir  12 Units Subcutaneous BID  . mouth rinse  15 mL Mouth Rinse BID  . metoprolol tartrate  12.5 mg Oral BID  . pantoprazole  40 mg Oral Daily  . polyethylene glycol  17 g Oral Daily   Continuous Infusions:  PRN Meds: acetaminophen **OR** acetaminophen, bisacodyl, hydrALAZINE, HYDROcodone-acetaminophen, HYDROmorphone (DILAUDID) injection, meperidine (DEMEROL) injection, ondansetron **OR** ondansetron (ZOFRAN) IV, temazepam   Vital Signs    Vitals:   08/26/17 0700 08/26/17 0800 08/26/17 0815 08/26/17 0900  BP: (!) 154/65 (!) 166/70  (!) 171/49  Pulse: 76 78 80 81  Resp: 15 17 19 12   Temp:  97.8 F (36.6 C)    TempSrc:  Oral    SpO2: 99% 99% 100% 100%  Weight:      Height:        Intake/Output Summary (Last 24 hours) at 08/26/2017 0940 Last data filed at 08/26/2017 0800 Gross per 24 hour  Intake 540 ml  Output 1950 ml  Net -1410 ml   Filed Weights   08/23/17 0500 08/24/17 0500 08/25/17 0500  Weight: 141 lb 8.6 oz (64.2 kg) 141 lb 8.6 oz (64.2 kg) 143 lb 4.8 oz (65 kg)    Telemetry    Sinus rhythm.  Personally reviewed.  ECG    Tracing from 08/25/2017 shows sinus rhythm with nonspecific ST changes.  Personally reviewed.  Physical Exam   GEN:  Elderly woman, no acute distress.   Neck: No JVD. Cardiac: RRR, no gallop.  Respiratory: Nonlabored. Clear to auscultation bilaterally. GI: Soft, nontender, bowel sounds present. MS: No edema; No deformity. Neuro:   Nonfocal. Psych: Alert and oriented x 2. Normal affect.  Labs    Chemistry Recent Labs  Lab 08/22/17 0313 08/23/17 0303 08/25/17 0316  NA 134* 134* 135  K 3.9 4.6 4.4  CL 94* 94* 96*  CO2 28 29 29   GLUCOSE 293* 297* 137*  BUN 24* 28* 25*  CREATININE 1.47* 1.41* 1.25*  CALCIUM 7.7* 8.0* 8.2*  GFRNONAA 32* 34* 39*  GFRAA 37* 39* 45*  ANIONGAP 12 11 10      Hematology Recent Labs  Lab 08/20/17 0654 08/21/17 0302 08/23/17 0303  WBC 9.7 11.9* 9.6  RBC 3.23* 3.39* 3.80*  HGB 9.4* 9.8* 11.2*  HCT 30.1* 30.8* 34.4*  MCV 93.2 90.9 90.5  MCH 29.1 28.9 29.5  MCHC 31.2 31.8 32.6  RDW 14.2 14.0 14.1  PLT 382 426* 429*    Cardiac Enzymes Recent Labs  Lab 08/19/17 1816 08/20/17 0020 08/20/17 0654 08/21/17 0302  TROPONINI 0.08* 0.09* 0.09* 0.06*   No results for input(s): TROPIPOC in the last 168 hours.   BNP No results for input(s): BNP, PROBNP in the last 168 hours.   Radiology    No results found.  Cardiac Studies   Echocardiogram 08/20/2017: Study Conclusions  - Left ventricle: The cavity size was normal. Wall thickness was normal. Systolic function was mildly reduced.  The estimated ejection fraction was in the range of 45% to 50%. There is akinesis of the inferoseptal myocardium. There was no evidence of elevated ventricular filling pressure by Doppler parameters. - Mitral valve: There was moderate regurgitation directed posteriorly. - Left atrium: The atrium was severely dilated. - Tricuspid valve: There was moderate regurgitation. - Pulmonary arteries: Systolic pressure was moderately increased. PA peak pressure: 57 mm Hg (S).  Patient Profile     84 y.o. female with history of CAD, and following stroke at time of cardiac catheterization in 2006, hypertension, type, hyperlipidemia, left breast cancer.  She is admitted with weakness associated with abdominal pain, nausea, and diarrhea. Cardiology service following secondary to elevated  troponin I, acute on chronic combined heart failure, and persistent atrial fibrillation.  Assessment & Plan    1.  Paroxysmal to persistent atrial fibrillation, initially with RVR and newly diagnosed.  CHADSVASC score is 9 and she is currently on Eliquis for stroke prophylaxis.  She has converted to sinus rhythm on IV amiodarone and low-dose Lopressor. She maintains SR with ventricular rates in 70'.  2.  CAD without active angina symptoms.  She is on statin therapy.  Aspirin discontinued with use of Eliquis.  3.  Minor elevation in troponin I, flat pattern suggestive of demand ischemia and/or heart failure rather than ACS.  4.  Acute on chronic combined heart failure, LVEF 25 to 50%.  Lasix has been discontinued. Weight up 2 lbs, negative 1.4 L, I would continue to hold lasix for now and reassess in the am.  5. Hypertension - increase lopressor 25 mg po bid, add amlodipine 2.5 mg po daily  Continue Lopressor and Eliquis, PO amiodarone, the patient can be transferred to a regular floor.   Signed, Ena Dawley, MD  08/26/2017, 9:40 AM

## 2017-08-26 NOTE — Progress Notes (Signed)
PROGRESS NOTE    [name]  QIH:[id number] DOB: 10/27/1932 DOA: 08/19/2017 PCP: Burnard Bunting, MD   Brief Narrative: [name] is a 84 year old female with medical history significant for CAD status post MI, CVA during cardiac cath, hypertension, hyperlipidemia, breast cancer S/P left lumpectomy in 07/2017, DM type II, presents to the ED complaining of nausea/vomiting, watery diarrhea, abdominal pain, shortness of breath/orthopnea/PND, lower extremity edema for the past 2 weeks. She is tatus post lumpectomy, she had also developed presumed mastitis and was started on Bactrim.  Due to the persistent nausea/vomiting at home , patient unable to keep anything down. She denied any fever/chills. In the ED, patient noted to be requiring 4L [place], chest x-ray and CT done showed bilateral pleural effusions, concern for vascular congestion.  CT abdomen pelvis showed no acute intra-abdominal process. Patient was admitted for the management of  CHF exacerbation.  Cardiology consulted.  Started on diuresis.  Patient developed A. fib with RVR.  Currently she is on amiodarone drip. Started on anticoagulation.  Assessment & Plan:   Principal Problem:   Acute on chronic systolic CHF (congestive heart failure) (HCC) Active Problems:   Hyperlipidemia   Coronary artery disease involving native coronary artery of native heart with angina pectoris (HCC)   Cerebral artery occlusion with cerebral infarction ([place])   Malignant neoplasm of upper-outer quadrant of left breast in female, estrogen receptor positive ([place])   H/O: CVA (cerebrovascular accident)   History of myocardial infarction   Diabetes mellitus ([place])   HTN (hypertension)   Acute hypoxemic respiratory failure (HCC)   Nausea and vomiting   Atrial fibrillation with RVR (HCC)  Acute on chronic systolic heart failure: Mild elevated BNP.  Chest x-ray had shown pulmonary vascular congestion.  Echocardiogram showed ejection fraction of 45 to 50%,  PA peak pressure of 57 mmHg.  Cardiology is following.  On oral Lasix daily but it has been stopped due to hypotension few days ago. Continue input and output measures.  A. fib with RVR: New onset. On metoprolol  12.5 twice a day.   Cardiology following.  HR better controlled this morning. BP improved.  Amiodarone changed to oral.  Acute hypoxic respiratory failure: Secondary to CHF exacerbation  Continue supplemental oxygen as needed.  Currently respiratory status stable.  Acute gastroenteritis: Afebrile, no leukocytosis.  GI pathogen panel, C. difficile negative.  Diarrhea has stopped.  CT abdomen/pelvis without any acute changes.  History of recent Bactrim use.  Acute kidney injury: We will continue to monitor.On Lasix daily.acute kidney injury might be related  to her hypotensive episodes.  Kidney function close to baseline now.  History of coronary disease/elevated troponin: Troponins flat.  EKG without any acute ischemic changes.  Cardiology following.  Denies any chest pain.  Continue ASA, statin, beta-blocker.  History of mastitis: Currently no evidence of infection.  She was on antibiotics before.  Chronic disease: Stable.  Hemoglobin at baseline.    History of hypertension/hyperlipidemia: Continue home meds.  Continue Lopressor,statin.  BP has been acceptable now.Added amlodipine.  Diabetes mellitus: Continue  current insulin regimen.  Blood sugars noted to be high this morning so increased the dose of Levemir.  Left breast cancer: on tamoxifen  Depression/anxiety: Continue citalopram, temazepam  Confusion/delirium:   Currently alert and oriented.  We will continue to monitor the mental status.  Deconditioning/debility: Patient evaluated by PT and recommended skilled nursing facility on discharge.  Social worker following.  Patient cannot ambulate and uses scooter for ambulation for the last  few years because of severe pain on her bilateral lower extremities.    DVT  prophylaxis: Hep [place] Code Status: DNR Family Communication: None present at the bedside  disposition Plan: Skilled nursing facility as soon as the bed is available.[place] tomorrow Consultants: Cardiology  Procedures:None  Antimicrobials:None  Subjective: Patient seen and examined at the bedside this morning.  Remains comfortable.  Mental status stable.Heart rate and blood pressure have been better.  Objective:  Vitals:   08/26/17 0900 08/26/17 1000 08/26/17 1007 08/26/17 1008  BP: (!) 171/49 (!) 133/36 (!) 133/36 (!) 133/36  Pulse: 81 69 68   Resp: 12 (!) 8    Temp:      TempSrc:      SpO2: 100% 98%    Weight:      Height:        Intake/Output Summary (Last 24 hours) at 08/26/2017 1110 Last data filed at 08/26/2017 0800 Gross per 24 hour  Intake 540 ml  Output 1650 ml  Net -1110 ml   Filed Weights   08/23/17 0500 08/24/17 0500 08/25/17 0500  Weight: 64.2 kg (141 lb 8.6 oz) 64.2 kg (141 lb 8.6 oz) 65 kg (143 lb 4.8 oz)    Examination:  General exam: Appears calm and comfortable ,Not in distress, thin, elderly female HEENT:PERRL,Oral mucosa moist, Ear/Nose normal on gross exam Respiratory system: Bilateral equal air entry, normal vesicular breath sounds, no wheezes or crackles  Cardiovascular system: S1 & S2 heard, RRR. No JVD, murmurs, rubs, gallops or clicks. Gastrointestinal system: Abdomen is nondistended, soft and nontender. No organomegaly or masses felt. Normal bowel sounds heard. Central nervous system: Alert and oriented. No focal neurological deficits. Extremities: No edema, no clubbing ,no cyanosis, distal peripheral pulses palpable. Skin: No rashes, lesions or ulcers,no icterus ,no pallor MSK: Normal muscle bulk,tone ,power Psychiatry: Judgement and insight appear normal. Mood & affect appropriate.      Data Reviewed: I have personally reviewed following labs and imaging studies  CBC: Recent Labs  Lab 08/20/17 0654 08/21/17 0302 08/23/17 0303    WBC 9.7 11.9* 9.6  NEUTROABS  --  9.9* 5.5  HGB 9.4* 9.8* 11.2*  HCT 30.1* 30.8* 34.4*  MCV 93.2 90.9 90.5  PLT 382 426* 616*   Basic Metabolic Panel: Recent Labs  Lab 08/20/17 0654 08/21/17 0302 08/22/17 0313 08/23/17 0303 08/25/17 0316  NA 136 134* 134* 134* 135  K 4.2 4.0 3.9 4.6 4.4  CL 101 94* 94* 94* 96*  CO2 21* 26 28 29 29   GLUCOSE 132* 199* 293* 297* 137*  BUN 18 19 24* 28* 25*  CREATININE 1.29* 1.25* 1.47* 1.41* 1.25*  CALCIUM 8.9 8.6* 7.7* 8.0* 8.2*   GFR: Estimated Creatinine Clearance: 31.2 mL/min (A) (by C-G formula based on SCr of 1.25 mg/dL (H)). Liver Function Tests: No results for input(s): AST, ALT, ALKPHOS, BILITOT, PROT, ALBUMIN in the last 168 hours. No results for input(s): LIPASE, AMYLASE in the last 168 hours. No results for input(s): AMMONIA in the last 168 hours. Coagulation Profile: Recent Labs  Lab 08/20/17 0956  INR 1.18   Cardiac Enzymes: Recent Labs  Lab 08/19/17 1230 08/19/17 1816 08/20/17 0020 08/20/17 0654 08/21/17 0302  TROPONINI 0.05* 0.08* 0.09* 0.09* 0.06*   BNP (last 3 results) No results for input(s): PROBNP in the last 8760 hours. HbA1C: No results for input(s): HGBA1C in the last 72 hours. CBG: Recent Labs  Lab 08/25/17 0733 08/25/17 1246 08/25/17 1618 08/26/17 0805 08/26/17 0857  GLUCAP 117* 80 124*  60* 154*   Lipid Profile: No results for input(s): CHOL, HDL, LDLCALC, TRIG, CHOLHDL, LDLDIRECT in the last 72 hours. Thyroid Function Tests: No results for input(s): TSH, T4TOTAL, FREET4, T3FREE, THYROIDAB in the last 72 hours. Anemia Panel: No results for input(s): VITAMINB12, FOLATE, FERRITIN, TIBC, IRON, RETICCTPCT in the last 72 hours. Sepsis Labs: No results for input(s): PROCALCITON, LATICACIDVEN in the last 168 hours.  Recent Results (from the past 240 hour(s))  MRSA PCR Screening     Status: None   Collection Time: 08/19/17 10:48 PM  Result Value Ref Range Status   MRSA by PCR NEGATIVE NEGATIVE  Final    Comment:        The GeneXpert MRSA Assay (FDA approved for NASAL specimens only), is one component of a comprehensive MRSA colonization surveillance program. It is not intended to diagnose MRSA infection nor to guide or monitor treatment for MRSA infections. Performed at [facility], [place] [address], [place], [place] [address]          Radiology Studies: No results found.      Scheduled Meds: . amiodarone  200 mg Oral BID  . amLODipine  2.5 mg Oral Daily  . apixaban  2.5 mg Oral BID  . atorvastatin  20 mg Oral QHS  . escitalopram  10 mg Oral Daily  . haloperidol lactate  1 mg Intramuscular Once  . insulin aspart  0-5 Units Subcutaneous TID WC  . insulin aspart  2 Units Subcutaneous TID AC  . insulin detemir  12 Units Subcutaneous BID  . mouth rinse  15 mL Mouth Rinse BID  . metoprolol tartrate  12.5 mg Oral BID  . pantoprazole  40 mg Oral Daily  . polyethylene glycol  17 g Oral Daily   Continuous Infusions:    LOS: 7 days    Time spent: 25 mins.More than 50% of that time was spent in counseling and/or coordination of care.      Shelly Coss, MD [facility] Pager [phone]  If 7PM-7AM, please contact night-coverage [email or web address] Password [email or web address] 08/26/2017, 11:10 AM

## 2017-08-26 NOTE — Progress Notes (Signed)
PT Cancellation Note  Patient Details Name: [name] MRN: [id number] DOB: [birth year]   Cancelled Treatment:    Reason Eval/Treat Not Completed: Patient declined, no reason specified(not feeling well)   [facility] 08/26/2017, 12:29 PM

## 2017-08-27 DIAGNOSIS — E1121 Type 2 diabetes mellitus with diabetic nephropathy: Secondary | ICD-10-CM | POA: Diagnosis not present

## 2017-08-27 DIAGNOSIS — C50912 Malignant neoplasm of unspecified site of left female breast: Secondary | ICD-10-CM | POA: Diagnosis not present

## 2017-08-27 DIAGNOSIS — J9601 Acute respiratory failure with hypoxia: Secondary | ICD-10-CM | POA: Diagnosis not present

## 2017-08-27 DIAGNOSIS — Z794 Long term (current) use of insulin: Secondary | ICD-10-CM | POA: Diagnosis not present

## 2017-08-27 DIAGNOSIS — I25119 Atherosclerotic heart disease of native coronary artery with unspecified angina pectoris: Secondary | ICD-10-CM | POA: Diagnosis not present

## 2017-08-27 DIAGNOSIS — I5043 Acute on chronic combined systolic (congestive) and diastolic (congestive) heart failure: Secondary | ICD-10-CM | POA: Diagnosis not present

## 2017-08-27 DIAGNOSIS — N179 Acute kidney failure, unspecified: Secondary | ICD-10-CM | POA: Diagnosis not present

## 2017-08-27 DIAGNOSIS — Z8673 Personal history of transient ischemic attack (TIA), and cerebral infarction without residual deficits: Secondary | ICD-10-CM | POA: Diagnosis not present

## 2017-08-27 DIAGNOSIS — M2550 Pain in unspecified joint: Secondary | ICD-10-CM | POA: Diagnosis not present

## 2017-08-27 DIAGNOSIS — Z170 Estrogen receptor positive status [ER+]: Secondary | ICD-10-CM | POA: Diagnosis not present

## 2017-08-27 DIAGNOSIS — E1165 Type 2 diabetes mellitus with hyperglycemia: Secondary | ICD-10-CM | POA: Diagnosis not present

## 2017-08-27 DIAGNOSIS — I5023 Acute on chronic systolic (congestive) heart failure: Secondary | ICD-10-CM | POA: Diagnosis not present

## 2017-08-27 DIAGNOSIS — K529 Noninfective gastroenteritis and colitis, unspecified: Secondary | ICD-10-CM | POA: Diagnosis not present

## 2017-08-27 DIAGNOSIS — E785 Hyperlipidemia, unspecified: Secondary | ICD-10-CM | POA: Diagnosis not present

## 2017-08-27 DIAGNOSIS — Z7401 Bed confinement status: Secondary | ICD-10-CM | POA: Diagnosis not present

## 2017-08-27 DIAGNOSIS — C50412 Malignant neoplasm of upper-outer quadrant of left female breast: Secondary | ICD-10-CM | POA: Diagnosis not present

## 2017-08-27 DIAGNOSIS — I10 Essential (primary) hypertension: Secondary | ICD-10-CM | POA: Diagnosis not present

## 2017-08-27 DIAGNOSIS — I110 Hypertensive heart disease with heart failure: Secondary | ICD-10-CM | POA: Diagnosis not present

## 2017-08-27 DIAGNOSIS — I4891 Unspecified atrial fibrillation: Secondary | ICD-10-CM | POA: Diagnosis not present

## 2017-08-27 LAB — GLUCOSE, CAPILLARY
Glucose-Capillary: 95 mg/dL (ref 65–99)
Glucose-Capillary: 252 mg/dL — ABNORMAL HIGH (ref 65–99)

## 2017-08-27 MED ORDER — POLYETHYLENE GLYCOL 3350 17 G PO PACK: 17.0000 g | PACK | Freq: Every day | ORAL | 0 refills | Status: DC | PRN

## 2017-08-27 MED ORDER — HYDROCODONE-ACETAMINOPHEN 10-325 MG PO TABS: 1.0000 | ORAL_TABLET | ORAL | 0 refills | Status: DC | PRN

## 2017-08-27 MED ORDER — AMIODARONE HCL 200 MG PO TABS: 200.0000 mg | ORAL_TABLET | Freq: Two times a day (BID) | ORAL | 0 refills | Status: DC

## 2017-08-27 MED ORDER — ENSURE ENLIVE PO LIQD: 237.0000 mL | Freq: Two times a day (BID) | ORAL | Status: DC

## 2017-08-27 MED ORDER — METOPROLOL TARTRATE 25 MG PO TABS: 12.5000 mg | ORAL_TABLET | Freq: Two times a day (BID) | ORAL | 0 refills | Status: DC

## 2017-08-27 MED ORDER — APIXABAN 2.5 MG PO TABS: 2.5000 mg | ORAL_TABLET | Freq: Two times a day (BID) | ORAL | 0 refills | Status: DC

## 2017-08-27 NOTE — Progress Notes (Signed)
Physical Therapy Treatment Patient Details Name: [name] MRN: [id number] DOB: 10-09-1932 Today's Date: 08/27/2017    History of Present Illness 84 year old female with medical history significant for CAD status post MI, CVA during cardiac cath, hypertension, hyperlipidemia, breast cancer , DM type II, presents to the ED 08/19/17 complaining of nausea/vomiting, watery diarrhea, abdominal pain, shortness of breath/orthopnea, hypoxia in ED.    PT Comments    Min encouragement for participation. Progressing slowly with mobility. Vitals WNL during session. Pt c/o chronic bil LE pain that limits ability to ambulate. Pt continues to require Mod assist +2 for mobility. Pt will need ST rehab at SNF. Will continue to follow.    Follow Up Recommendations  SNF     Equipment Recommendations  None recommended by PT    Recommendations for Other Services       Precautions / Restrictions Precautions Precautions: Fall Restrictions Weight Bearing Restrictions: No    Mobility  Bed Mobility Overal bed mobility: Needs Assistance Bed Mobility: Supine to Sit     Supine to sit: Mod assist;HOB elevated     General bed mobility comments: Assist for LEs and trunk. Utilized bedpad to aid with scooting. Increased time. Cues for technique.   Transfers Overall transfer level: Needs assistance Equipment used: Rolling walker (2 wheeled) Transfers: Sit to/from [facility] Sit to Stand: Mod assist;+2 physical assistance;+2 safety/equipment Stand pivot transfers: Mod assist;+2 physical assistance;+2 safety/equipment       General transfer comment: Assist to rise, stabilize, control descent. Increased time to steady. Stand pivot, bed to recliner, with RW.   Ambulation/Gait Ambulation/Gait assistance: Mod assist;+2 physical assistance;+2 safety/equipment Ambulation Distance (Feet): 3 Feet Assistive device: Rolling walker (2 wheeled)       General Gait Details: Assist to  stabilize pt and maneuver with RW. Distance limited by pain, fatigue. O2 sat remained >90% on RA.    Stairs             Wheelchair Mobility    Modified Rankin (Stroke Patients Only)       Balance Overall balance assessment: Needs assistance         Standing balance support: Bilateral upper extremity supported Standing balance-Leahy Scale: Poor                              Cognition Arousal/Alertness: Awake/alert Behavior During Therapy: WFL for tasks assessed/performed Overall Cognitive Status: Within Functional Limits for tasks assessed                                        Exercises      General Comments        Pertinent Vitals/Pain Pain Assessment: Faces Faces Pain Scale: Hurts little more Pain Location: chronic LE pain Pain Descriptors / Indicators: Aching;Sore Pain Intervention(s): Limited activity within patient's tolerance;Repositioned    Home Living                      Prior Function            PT Goals (current goals can now be found in the care plan section) Progress towards PT goals: Progressing toward goals    Frequency    Min 2X/week      PT Plan Current plan remains appropriate    Co-evaluation  AM-PAC PT "6 Clicks" Daily Activity  Outcome Measure  Difficulty turning over in bed (including adjusting bedclothes, sheets and blankets)?: A Lot Difficulty moving from lying on back to sitting on the side of the bed? : A Lot Difficulty sitting down on and standing up from a chair with arms (e.g., wheelchair, bedside commode, etc,.)?: Unable Help needed moving to and from a bed to chair (including a wheelchair)?: A Lot Help needed walking in hospital room?: Total Help needed climbing 3-5 steps with a railing? : Total 6 Click Score: 9    End of Session Equipment Utilized During Treatment: Gait belt Activity Tolerance: Patient limited by fatigue;Patient limited by  pain Patient left: in chair;with call bell/phone within reach;with chair alarm set   PT Visit Diagnosis: Muscle weakness (generalized) (M62.81);Difficulty in walking, not elsewhere classified (R26.2);Unsteadiness on feet (R26.81)     Time: 3532-9924 PT Time Calculation (min) (ACUTE ONLY): 10 min  Charges:  $Therapeutic Activity: 8-22 mins                    G Codes:          Weston Anna, MPT Pager: [phone]

## 2017-08-27 NOTE — Progress Notes (Signed)
Progress Note  Patient Name: [name] Date of Encounter: 08/27/2017  Primary Cardiologist: Dr. Shirlee More  Subjective   The patient was constipated but had a large BM this am and feels better, no chest pain or palpitations.   Inpatient Medications    Scheduled Meds: . amiodarone  200 mg Oral BID  . amLODipine  2.5 mg Oral Daily  . apixaban  2.5 mg Oral BID  . atorvastatin  20 mg Oral QHS  . escitalopram  10 mg Oral Daily  . haloperidol lactate  1 mg Intramuscular Once  . insulin aspart  0-5 Units Subcutaneous TID WC  . insulin aspart  2 Units Subcutaneous TID AC  . insulin detemir  12 Units Subcutaneous BID  . mouth rinse  15 mL Mouth Rinse BID  . metoprolol tartrate  12.5 mg Oral BID  . pantoprazole  40 mg Oral Daily  . polyethylene glycol  17 g Oral Daily   Continuous Infusions:  PRN Meds: acetaminophen **OR** acetaminophen, bisacodyl, bisacodyl, hydrALAZINE, HYDROcodone-acetaminophen, HYDROmorphone (DILAUDID) injection, meperidine (DEMEROL) injection, ondansetron **OR** ondansetron (ZOFRAN) IV, temazepam   Vital Signs    Vitals:   08/27/17 0555 08/27/17 0600 08/27/17 0700 08/27/17 0713  BP:  (!) 159/57 (!) 151/53   Pulse: 73 72 73   Resp: 11 10 (!) 6   Temp:    97.9 F (36.6 C)  TempSrc:    Oral  SpO2: 97% 100% 98%   Weight: 146 lb 6.2 oz (66.4 kg)     Height:        Intake/Output Summary (Last 24 hours) at 08/27/2017 0752 Last data filed at 08/27/2017 0555 Gross per 24 hour  Intake 730 ml  Output 1875 ml  Net -1145 ml   Filed Weights   08/25/17 0500 08/26/17 1600 08/27/17 0555  Weight: 143 lb 4.8 oz (65 kg) 145 lb 4.5 oz (65.9 kg) 146 lb 6.2 oz (66.4 kg)    Telemetry    Sinus rhythm.  Personally reviewed.  ECG    Tracing from 08/25/2017 shows sinus rhythm with nonspecific ST changes.  Personally reviewed.  Physical Exam   GEN:  Elderly woman, no acute distress.   Neck: No JVD. Cardiac: RRR, no gallop.  Respiratory: Nonlabored.  Clear to auscultation bilaterally. GI: Soft, nontender, bowel sounds present. MS: No edema; No deformity. Neuro:  Nonfocal. Psych: Alert and oriented x 2. Normal affect.  Labs    Chemistry Recent Labs  Lab 08/22/17 0313 08/23/17 0303 08/25/17 0316  NA 134* 134* 135  K 3.9 4.6 4.4  CL 94* 94* 96*  CO2 28 29 29   GLUCOSE 293* 297* 137*  BUN 24* 28* 25*  CREATININE 1.47* 1.41* 1.25*  CALCIUM 7.7* 8.0* 8.2*  GFRNONAA 32* 34* 39*  GFRAA 37* 39* 45*  ANIONGAP 12 11 10      Hematology Recent Labs  Lab 08/21/17 0302 08/23/17 0303  WBC 11.9* 9.6  RBC 3.39* 3.80*  HGB 9.8* 11.2*  HCT 30.8* 34.4*  MCV 90.9 90.5  MCH 28.9 29.5  MCHC 31.8 32.6  RDW 14.0 14.1  PLT 426* 429*    Cardiac Enzymes Recent Labs  Lab 08/21/17 0302  TROPONINI 0.06*   No results for input(s): TROPIPOC in the last 168 hours.   BNP No results for input(s): BNP, PROBNP in the last 168 hours.   Radiology    No results found.  Cardiac Studies   Echocardiogram 08/20/2017: Study Conclusions  - Left ventricle: The cavity size was normal.  Wall thickness was normal. Systolic function was mildly reduced. The estimated ejection fraction was in the range of 45% to 50%. There is akinesis of the inferoseptal myocardium. There was no evidence of elevated ventricular filling pressure by Doppler parameters. - Mitral valve: There was moderate regurgitation directed posteriorly. - Left atrium: The atrium was severely dilated. - Tricuspid valve: There was moderate regurgitation. - Pulmonary arteries: Systolic pressure was moderately increased. PA peak pressure: 57 mm Hg (S).  Patient Profile     84 y.o. female with history of CAD, and following stroke at time of cardiac catheterization in 2006, hypertension, type, hyperlipidemia, left breast cancer.  She is admitted with weakness associated with abdominal pain, nausea, and diarrhea. Cardiology service following secondary to elevated troponin  I, acute on chronic combined heart failure, and persistent atrial fibrillation.  Assessment & Plan    1.  Paroxysmal to persistent atrial fibrillation, initially with RVR and newly diagnosed.  CHADSVASC score is 9 and she is currently on Eliquis for stroke prophylaxis.  She has converted to sinus rhythm on IV amiodarone and low-dose Lopressor. She maintains SR in at least last 48 hours.   2.  CAD without active angina symptoms.  She is on statin therapy.  Aspirin discontinued with use of Eliquis.  3.  Minor elevation in troponin I, flat pattern suggestive of demand ischemia and/or heart failure rather than ACS. nShe denies any chest pain.   4.  Acute on chronic combined heart failure, LVEF 25 to 50%.  Lasix has been discontinued. Weight stable, negative 1.4 L, I would continue to hold lasix.  5. Hypertension - increase lopressor 25 mg po bid, added amlodipine 2.5 mg po daily, first day today, we will follow.  The patient can be transferred to a regular floor.   Signed, Ena Dawley, MD  08/27/2017, 7:52 AM

## 2017-08-27 NOTE — Progress Notes (Signed)
Initial Nutrition Assessment  DOCUMENTATION CODES:   Not applicable  INTERVENTION:  - Will order Ensure Enlive BID, each supplement provides 350 kcal and 20 grams of protein. - Continue to encourage PO intakes.   NUTRITION DIAGNOSIS:   Inadequate oral intake related to acute illness, nausea, vomiting as evidenced by per patient/family report, meal completion < 50%.  GOAL:   Patient will meet greater than or equal to 90% of their needs  MONITOR:   PO intake, Supplement acceptance, Weight trends, Labs  REASON FOR ASSESSMENT:   Malnutrition Screening Tool  ASSESSMENT:   84 year old female with medical history significant for CAD status post MI, CVA, HTN, hyperlipidemia, breast cancer S/P left lumpectomy in 07/2017, DM type 2, presents to the ED complaining of nausea/vomiting, watery diarrhea, abdominal pain, shortness of breath/orthopnea/PND, lower extremity edema for the past 2 weeks. She is s/p lumpectomy, she had also developed presumed mastitis and was started on Bactrim.  Due to the persistent nausea/vomiting at home, patient unable to keep anything down. In the ED, patient noted to be requiring 4L [place], chest x-ray and CT done showed bilateral pleural effusions, concern for vascular congestion. CT abdomen pelvis showed no acute intra-abdominal process. Patient was admitted for the management of CHF exacerbation and started on diuresis.  BMI indicates normal weight. Pt reports that N/V is much improved, no emesis since admission. With improvement in nausea, pt's appetite has improved although she is still not eating as well as at baseline. Per chart review, pt consumed 30% of breakfast, 100% of lunch, and 50% of dinner yesterday (total of 580 kcal and 32 grams of protein). She was able to eat 40% of breakfast this AM without issue.   Per Dr. Earmon Phoenix note yesterday, possible plan for SNF today. Dr. Francesca Oman (Cardiology) note from this AM states pt is stable to move out of SDU. Social  Worker note from this AM states that pt has been accepted to, and daughter in agreement with discharge to, [facility]; note states will need PT re-evaluation prior to this.   Medications reviewed; sliding scale Novolog, 2 units Novolog TID, 12 units Levemir BID, 1 packet Miralax/day.  Labs reviewed; CBG: 95 mg/dL this AM, no BMP since 5/25.     NUTRITION - FOCUSED PHYSICAL EXAM:  Completed/assessed with no muscle and no fat wasting; mild edema noted at this time.   Diet Order:   Diet Order           Diet heart healthy/carb modified Room service appropriate? Yes; Fluid consistency: Thin; Fluid restriction: 1500 mL Fluid  Diet effective now          EDUCATION NEEDS:   No education needs have been identified at this time  Skin:  Skin Assessment: Reviewed RN Assessment  Last BM:  5/28  Height:   Ht Readings from Last 1 Encounters:  08/19/17 5\' 5"  (1.651 m)    Weight:   Wt Readings from Last 1 Encounters:  08/27/17 146 lb 6.2 oz (66.4 kg)    Ideal Body Weight:  56.82 kg  BMI:  Body mass index is 24.36 kg/m.  Estimated Nutritional Needs:   Kcal:  1530-1730 (23-26 kcal/kg)  Protein:  75-85 grams  Fluid:  >/= 1.5 L/day     Jarome Matin, MS, RD, LDN, [facility] Inpatient Clinical Dietitian Pager # [phone] After hours/weekend pager # [phone]

## 2017-08-27 NOTE — Clinical Social Work Placement (Signed)
D/C Summary [facility] arranged for transport.  Nurse given number to call report.    CLINICAL SOCIAL WORK PLACEMENT  NOTE  Date:  08/27/2017  Patient Details  Name: FLORENCIA ZACCARO MRN: [id number] Date of Birth: [birth year]  Clinical Social Work is seeking post-discharge placement for this patient at the [place] level of care (*CSW will initial, date and re-position this form in  chart as items are completed):  Yes   Patient/family provided with [facility] Work Department's list of facilities offering this level of care within the geographic area requested by the patient (or if unable, by the patient's family).  Yes   Patient/family informed of their freedom to choose among providers that offer the needed level of care, that participate in Medicare, Medicaid or managed care program needed by the patient, have an available bed and are willing to accept the patient.  Yes   Patient/family informed of [facility]'s ownership interest in [facility] and [facility], as well as of the fact that they are under no obligation to receive care at these facilities.  PASRR submitted to EDS on 08/21/17     PASRR number received on 08/21/17     Existing PASRR number confirmed on       FL2 transmitted to all facilities in geographic area requested by pt/family on       FL2 transmitted to all facilities within larger geographic area on 08/27/17     Patient informed that his/her managed care company has contracts with or will negotiate with certain facilities, including the following:        No   Patient/family informed of bed offers received.  Patient chooses bed at [facility]     Physician recommends and patient chooses bed at      Patient to be transferred to [facility] on 08/27/17.  Patient to be transferred to facility by [facility]      Patient family notified on 08/27/17 of transfer.  Name of family member notified:   Daughter: mitzi     PHYSICIAN       Additional Comment:    _______________________________________________ Lia Hopping, LCSW 08/27/2017, 1:55 PM

## 2017-08-27 NOTE — Discharge Summary (Signed)
Physician Discharge Summary  BELLAMIA FERCH WEX:[id number] DOB: 12-Nov-1932 DOA: 08/19/2017  PCP: Burnard Bunting, MD  Admit date: 08/19/2017 Discharge date: 08/27/2017  Admitted From: Home Disposition:  Home  Discharge Condition:Stable CODE STATUS:FULL Diet recommendation: Heart Healthy  Brief/Interim Summary:  Taylor Cunning Priceis a 84 year old female with medical history significant for CAD status post MI, CVA during cardiac cath, hypertension, hyperlipidemia, breast cancer S/P left lumpectomy in 07/2017, DM type II,presents to the ED complaining of nausea/vomiting,waterydiarrhea, abdominal pain, shortness of breath/orthopnea/PND,lower extremity edema for the past 2 weeks. She is tatus post lumpectomy, she had also developed presumed mastitis and was started on Bactrim.Due to the persistent nausea/vomiting at home , patient unable to keep anything down. She denied any fever/chills.In the ED, patient noted to be requiring 4L [place], chest x-ray and CT done showed bilateral pleural effusions, concern for vascular congestion. CT abdomen pelvis showed no acute intra-abdominal process. Patient was admitted for the management of  CHF exacerbation.  Cardiology consulted.  Started on diuresis.  Patient also developed A. fib with RVR during this hospitalization which is a new finding.   She was started on amiodarone drip and her rate is currently controlled.  She has been started on Eliquis for anticoagulation and amiodarone oral. Her overall status is stable.  She is stable for discharge to skilled nursing facility today.  Following problems were addressed during hospitalization:  Acute on chronic systolic heart failure: Mild elevated BNP.  Chest x-ray had shown pulmonary vascular congestion.  Echocardiogram showed ejection fraction of 45 to 50%, PA peak pressure of 57 mmHg.  Cardiology was following.  On oral Lasix daily but it has been stopped due to hypotension few days ago. She is on ARB at home  which will continue.  A. fib with RVR: New onset. On metoprolol  12.5 twice a day.   HR better controlled this morning. BP improved.  Amiodarone changed to oral.  Acute hypoxic respiratory failure: Secondary to CHF exacerbation  Continue supplemental oxygen as needed.  Currently respiratory status stable.  Acute gastroenteritis: Afebrile, no leukocytosis.  GI pathogen panel, C. difficile negative.  Diarrhea has stopped.  CT abdomen/pelvis without any acute changes.  History of recent Bactrim use.  Acute kidney injury: We will continue to monitor.On Lasix daily.acute kidney injury might be related  to her hypotensive episodes.  Kidney function close to baseline now.  History of coronary disease/elevated troponin: Troponins flat.  EKG without any acute ischemic changes.  Cardiology following.  Denies any chest pain.  Continue ASA, statin, beta-blocker.  History of mastitis: Currently no evidence of infection.  She was on antibiotics before.  Chronic disease: Stable.  Hemoglobin at baseline.    History of hypertension/hyperlipidemia: Continue home meds.  Continue Lopressor,statin.  BP has been acceptable now.  Diabetes mellitus: Continue  current insulin regimen.  Blood sugars noted to be high this morning so increased the dose of Levemir.  Left breast cancer: on tamoxifen  Depression/anxiety: Continue citalopram, temazepam  Confusion/delirium:   Currently alert and oriented.  We will continue to monitor the mental status.  Deconditioning/debility: Patient evaluated by PT and recommended skilled nursing facility on discharge.Patient cannot ambulate and uses scooter for ambulation for the last few years because of severe pain on her bilateral lower extremities.     Discharge Diagnoses:  Principal Problem:   Acute on chronic systolic CHF (congestive heart failure) (HCC) Active Problems:   Hyperlipidemia   Coronary artery disease involving native coronary artery of native  heart with  angina pectoris ([place])   Cerebral artery occlusion with cerebral infarction ([place])   Malignant neoplasm of upper-outer quadrant of left breast in female, estrogen receptor positive ([place])   H/O: CVA (cerebrovascular accident)   History of myocardial infarction   Diabetes mellitus ([place])   HTN (hypertension)   Acute hypoxemic respiratory failure (HCC)   Nausea and vomiting   Atrial fibrillation with RVR [facility])    Discharge Instructions  Discharge Instructions    Diet - low sodium heart healthy   Complete by:  As directed    Discharge instructions   Complete by:  As directed    1) Follow up with your PCP in a week.  Do a CBC and BMP test during the follow-up. 2)Take prescribed medications as instructed. 3)Follow up with cardiology in 2 weeks.  Name and number of the provider has been attached.   Increase activity slowly   Complete by:  As directed      Allergies as of 08/27/2017      Reactions   Cephalexin Hives   Ivp Dye [iodinated Diagnostic Agents] Hives      Medication List    STOP taking these medications   sulfamethoxazole-trimethoprim 400-80 MG tablet Commonly known as:  BACTRIM,SEPTRA     TAKE these medications   amiodarone 200 MG tablet Commonly known as:  PACERONE Take 1 tablet (200 mg total) by mouth 2 (two) times daily.   apixaban 2.5 MG Tabs tablet Commonly known as:  ELIQUIS Take 1 tablet (2.5 mg total) by mouth 2 (two) times daily.   aspirin EC 81 MG tablet Take 81 mg by mouth daily.   escitalopram 10 MG tablet Commonly known as:  LEXAPRO Take 10 mg by mouth daily.   HYDROcodone-acetaminophen 10-325 MG tablet Commonly known as:  NORCO Take 1 tablet by mouth every 4 (four) hours as needed. For pain.   insulin aspart 100 UNIT/ML injection Commonly known as:  novoLOG Inject into the skin 3 (three) times daily before meals. Sliding scale   insulin detemir 100 UNIT/ML injection Commonly known as:  LEVEMIR Inject 6 Units into the skin 2  (two) times daily.   metoprolol tartrate 25 MG tablet Commonly known as:  LOPRESSOR Take 0.5 tablets (12.5 mg total) by mouth 2 (two) times daily.   omeprazole 20 MG capsule Commonly known as:  PRILOSEC Take 20 mg by mouth daily.   polyethylene glycol packet Commonly known as:  MIRALAX / GLYCOLAX Take 17 g by mouth daily as needed for moderate constipation.   simvastatin 40 MG tablet Commonly known as:  ZOCOR Take 1 tablet (40 mg total) by mouth at bedtime.   tamoxifen 20 MG tablet Commonly known as:  NOLVADEX Take 1 tablet (20 mg total) by mouth daily.   telmisartan 20 MG tablet Commonly known as:  MICARDIS Take 1 tablet (20 mg total) by mouth daily.   temazepam 15 MG capsule Commonly known as:  RESTORIL Take 15 mg by mouth at bedtime as needed. For sleep.      Follow-up Information    Burnard Bunting, MD Follow up in 1 week(s).   Specialty:  Internal Medicine Contact information: [place] [address] [phone]        Richardo Priest, MD. Schedule an appointment as soon as possible for a visit in 2 week(s).   Specialty:  Cardiology Contact information: [place] [address] [phone]          Allergies  Allergen Reactions  . Cephalexin Hives  . Ivp Dye [Iodinated Diagnostic Agents] Hives    Consultations:  Cardiology   Procedures/Studies: Ct Abdomen Pelvis Wo Contrast  Result Date: 08/19/2017 CLINICAL DATA:  Acute abdominal pain. Nausea, vomiting, and diarrhea. History of breast cancer with lumpectomy July 08, 2017. EXAM: CT ABDOMEN AND PELVIS WITHOUT CONTRAST TECHNIQUE: Multidetector CT imaging of the abdomen and pelvis was performed following the standard protocol without IV contrast. COMPARISON:  April 27, 2011 FINDINGS: Lower chest: Skin thickening in the inferior left breast, incompletely evaluated. Small to moderate bilateral pleural effusions. Large hiatal hernia. Mild cardiomegaly. A  calcified nodule in the lingula is of no significance. Opacity under the bilateral effusions is likely atelectasis. No suspicious infiltrate identified. Hepatobiliary: No focal liver abnormality is seen. No gallstones, gallbladder wall thickening, or biliary dilatation. Pancreas: Unremarkable. No pancreatic ductal dilatation or surrounding inflammatory changes. Spleen: Normal in size without focal abnormality. Adrenals/Urinary Tract: Adrenal glands are normal. No renal stones or hydronephrosis. No suspicious masses. The ureters and bladder are normal. Stomach/Bowel: Other than the hiatal hernia, the stomach is normal. The small bowel is normal. Colonic diverticulosis is seen without diverticulitis. The colon is normal. The appendix is been removed by report. Vascular/Lymphatic: Atherosclerotic changes are seen in the nonaneurysmal tortuous aorta and iliac vessels. No adenopathy. Reproductive: Status post hysterectomy. No adnexal masses. Other: No free air or free fluid. Increased attenuation in the subcutaneous fat diffusely suggests volume overload. Musculoskeletal: Evaluation of the left hip and pelvic bones is limited due to motion. Cortical malalignment in the left pelvic bones on series 4, image 47 is thought to be artifactual due to motion. Recommend clinical correlation. Grade 1 anterolisthesis of L4 versus L5. No fractures or traumatic malalignment. IMPRESSION: 1. No cause for the patient's abdominal symptoms identified. 2. Skin thickening in the inferior left breast is incompletely evaluated. This could be due to recent surgery or radiation. Recommend clinical correlation. Given history, if there is concern for a breast infection, ultrasound could better evaluate. 3. Large hiatal hernia. 4. Mild cardiomegaly, pleural effusions, and subcutaneous fat edema suggests volume overload. 5. Colonic diverticulosis without diverticulitis. 6. Atherosclerotic changes in the aorta and iliac vessels. Electronically  Signed   By: Dorise Bullion III M.D   On: 08/19/2017 15:23   Dg Chest Port 1 View  Result Date: 08/20/2017 CLINICAL DATA:  Shortness of breath, chest pain. EXAM: PORTABLE CHEST 1 VIEW COMPARISON:  Radiographs Aug 19, 2017. FINDINGS: Stable cardiomegaly with central pulmonary vascular congestion. Bibasilar interstitial densities are noted concerning for edema and possible mild pleural effusions. No pneumothorax is noted. Bony thorax is unremarkable. IMPRESSION: Stable cardiomegaly with central pulmonary vascular congestion. Probable mild bibasilar pulmonary edema is noted with small pleural effusions. Electronically Signed   By: Marijo Conception, M.D.   On: 08/20/2017 08:42   Dg Abdomen Acute W/chest  Result Date: 08/19/2017 CLINICAL DATA:  Wheezing, congestion. EXAM: DG ABDOMEN ACUTE W/ 1V CHEST COMPARISON:  Radiographs of June 20, 2011. FINDINGS: Mild cardiomegaly is noted with central pulmonary vascular congestion. Probable mild diffuse bilateral pulmonary edema is noted. No pneumothorax is noted. No significant pleural effusion is noted. No free air is noted to suggest pneumoperitoneum. No abnormal bowel gas pattern is noted. No abnormal calcifications are noted. IMPRESSION: No evidence of bowel obstruction or ileus. Mild cardiomegaly is noted with mild central pulmonary vascular congestion and probable bilateral pulmonary edema. Electronically Signed   By: Marijo Conception, M.D.   On: 08/19/2017 09:29  Subjective: Patient seen and examined the bedside this morning.  Remains comfortable.  Stable for discharge today.  Discharge Exam: Vitals:   08/27/17 1200 08/27/17 1227  BP:  (!) 118/55  Pulse:    Resp:  11  Temp: 98.3 F (36.8 C)   SpO2:  97%   Vitals:   08/27/17 1011 08/27/17 1016 08/27/17 1200 08/27/17 1227  BP: (!) 178/51 (!) 178/51  (!) 118/55  Pulse: 72     Resp: (!) 28   11  Temp:   98.3 F (36.8 C)   TempSrc:      SpO2: 100%   97%  Weight:      Height:         General: Pt is alert, awake, not in acute distress Cardiovascular: RRR, S1/S2 +, no rubs, no gallops Respiratory: CTA bilaterally, no wheezing, no rhonchi Abdominal: Soft, NT, ND, bowel sounds + Extremities: no edema, no cyanosis    The results of significant diagnostics from this hospitalization (including imaging, microbiology, ancillary and laboratory) are listed below for reference.     Microbiology: Recent Results (from the past 240 hour(s))  MRSA PCR Screening     Status: None   Collection Time: 08/19/17 10:48 PM  Result Value Ref Range Status   MRSA by PCR NEGATIVE NEGATIVE Final    Comment:        The GeneXpert MRSA Assay (FDA approved for NASAL specimens only), is one component of a comprehensive MRSA colonization surveillance program. It is not intended to diagnose MRSA infection nor to guide or monitor treatment for MRSA infections. Performed at [facility], [place] [address] [place], [place] [address]      Labs: BNP (last 3 results) Recent Labs    08/19/17 0933  BNP 109.3*   Basic Metabolic Panel: Recent Labs  Lab 08/21/17 0302 08/22/17 0313 08/23/17 0303 08/25/17 0316  NA 134* 134* 134* 135  K 4.0 3.9 4.6 4.4  CL 94* 94* 94* 96*  CO2 26 28 29 29   GLUCOSE 199* 293* 297* 137*  BUN 19 24* 28* 25*  CREATININE 1.25* 1.47* 1.41* 1.25*  CALCIUM 8.6* 7.7* 8.0* 8.2*   Liver Function Tests: No results for input(s): AST, ALT, ALKPHOS, BILITOT, PROT, ALBUMIN in the last 168 hours. No results for input(s): LIPASE, AMYLASE in the last 168 hours. No results for input(s): AMMONIA in the last 168 hours. CBC: Recent Labs  Lab 08/21/17 0302 08/23/17 0303  WBC 11.9* 9.6  NEUTROABS 9.9* 5.5  HGB 9.8* 11.2*  HCT 30.8* 34.4*  MCV 90.9 90.5  PLT 426* 429*   Cardiac Enzymes: Recent Labs  Lab 08/21/17 0302  TROPONINI 0.06*   BNP: Invalid input(s): POCBNP CBG: Recent Labs  Lab 08/26/17 1136 08/26/17 1715 08/26/17 2106  08/27/17 0734 08/27/17 1153  GLUCAP 294* 230* 199* 95 252*   D-Dimer No results for input(s): DDIMER in the last 72 hours. Hgb A1c No results for input(s): HGBA1C in the last 72 hours. Lipid Profile No results for input(s): CHOL, HDL, LDLCALC, TRIG, CHOLHDL, LDLDIRECT in the last 72 hours. Thyroid function studies No results for input(s): TSH, T4TOTAL, T3FREE, THYROIDAB in the last 72 hours.  Invalid input(s): FREET3 Anemia work up No results for input(s): VITAMINB12, FOLATE, FERRITIN, TIBC, IRON, RETICCTPCT in the last 72 hours. Urinalysis    Component Value Date/Time   COLORURINE YELLOW 08/19/2017 0933   APPEARANCEUR CLEAR 08/19/2017 0933   LABSPEC 1.017 08/19/2017 0933   PHURINE 5.0 08/19/2017 0933   GLUCOSEU  NEGATIVE 08/19/2017 0933   HGBUR SMALL (A) 08/19/2017 0933   BILIRUBINUR NEGATIVE 08/19/2017 0933   KETONESUR 5 (A) 08/19/2017 0933   PROTEINUR NEGATIVE 08/19/2017 0933   UROBILINOGEN 0.2 11/25/2012 2032   NITRITE NEGATIVE 08/19/2017 0933   LEUKOCYTESUR TRACE (A) 08/19/2017 0933   Sepsis Labs Invalid input(s): PROCALCITONIN,  WBC,  LACTICIDVEN Microbiology Recent Results (from the past 240 hour(s))  MRSA PCR Screening     Status: None   Collection Time: 08/19/17 10:48 PM  Result Value Ref Range Status   MRSA by PCR NEGATIVE NEGATIVE Final    Comment:        The GeneXpert MRSA Assay (FDA approved for NASAL specimens only), is one component of a comprehensive MRSA colonization surveillance program. It is not intended to diagnose MRSA infection nor to guide or monitor treatment for MRSA infections. Performed at [facility], [place] [address] [place], [place] [address]     Please note: You were cared for by a hospitalist during your hospital stay. Once you are discharged, your primary care physician will handle any further medical issues. Please note that NO REFILLS for any discharge medications will be authorized once you are discharged,  as it is imperative that you return to your primary care physician (or establish a relationship with a primary care physician if you do not have one) for your post hospital discharge needs so that they can reassess your need for medications and monitor your lab values.    Time coordinating discharge: 40 minutes  SIGNED:   Shelly Coss, MD  [facility] 08/27/2017, 1:25 PM Pager [phone]  If 7PM-7AM, please contact night-coverage [email or web address] Password [email or web address]

## 2017-08-27 NOTE — Progress Notes (Addendum)
CSW following patient for discharge needs to SNF. CSW discussed patient d/c plan with daughter. She is agreeable to [facility].  Patient will need an updated physical therapy note. Patient last participated with PT on 5/21.  CSW informed physician and nursing staff.   Kathrin Greathouse, Latanya Presser, MSW Clinical Social Worker  [phone] 08/27/2017  9:56 AM

## 2017-08-27 NOTE — Progress Notes (Signed)
[place] [phone] and Report given to [facility]. Advised transport will be here to pick pt up around 3:00-3:30pm. Will continue to monitor.

## 2017-08-27 NOTE — Progress Notes (Signed)
Daughter Mitzi called to make aware transport has arrived to take pt to [facility].

## 2017-08-27 NOTE — Plan of Care (Signed)
Problem: Education: Goal: Knowledge of General Education information will improve Outcome: Adequate for Discharge   Problem: Health Behavior/Discharge Planning: Goal: Ability to manage health-related needs will improve Outcome: Adequate for Discharge   Problem: Clinical Measurements: Goal: Ability to maintain clinical measurements within normal limits will improve Outcome: Adequate for Discharge Goal: Will remain free from infection Outcome: Adequate for Discharge Goal: Diagnostic test results will improve Outcome: Adequate for Discharge Goal: Respiratory complications will improve Outcome: Adequate for Discharge Goal: Cardiovascular complication will be avoided Outcome: Adequate for Discharge   Problem: Activity: Goal: Risk for activity intolerance will decrease Outcome: Adequate for Discharge   Problem: Nutrition: Goal: Adequate nutrition will be maintained Outcome: Adequate for Discharge   Problem: Coping: Goal: Level of anxiety will decrease Outcome: Adequate for Discharge   Problem: Elimination: Goal: Will not experience complications related to bowel motility Outcome: Adequate for Discharge Goal: Will not experience complications related to urinary retention Outcome: Adequate for Discharge   Problem: Pain Managment: Goal: General experience of comfort will improve Outcome: Adequate for Discharge   Problem: Safety: Goal: Ability to remain free from injury will improve Outcome: Adequate for Discharge   Problem: Skin Integrity: Goal: Risk for impaired skin integrity will decrease Outcome: Adequate for Discharge   Problem: Education: Goal: Ability to verbalize understanding of causes of urinary incontinence will improve Outcome: Adequate for Discharge Goal: Knowledge of measures to decrease incidence of urinary incontinence will improve Outcome: Adequate for Discharge   Problem: Coping: Goal: Ability to verbalize feelings about incontinence will  improve Outcome: Adequate for Discharge   Problem: Urinary Elimination: Goal: Pelvic muscle control will improve Outcome: Adequate for Discharge Goal: Incidence of incontinence will decrease Outcome: Adequate for Discharge Goal: Risk for impaired skin integrity will decrease Outcome: Adequate for Discharge   Problem: Nutrition Goal: Patient maintains adequate hydration Outcome: Adequate for Discharge Goal: Patient maintains weight Outcome: Adequate for Discharge Goal: Patient/Family demonstrates understanding of diet Outcome: Adequate for Discharge Goal: Patient/Family independently completes tube feeding Outcome: Adequate for Discharge Goal: Patient will have no more than 5 lb weight change during LOS Outcome: Adequate for Discharge Goal: Patient will utilize adaptive techniques to administer nutrition Outcome: Adequate for Discharge Goal: Patient will verbalize dietary restrictions Outcome: Adequate for Discharge   Problem: Education: Goal: Ability to demonstrate management of disease process will improve Outcome: Adequate for Discharge Goal: Ability to verbalize understanding of medication therapies will improve Outcome: Adequate for Discharge   Problem: Activity: Goal: Capacity to carry out activities will improve Outcome: Adequate for Discharge   Problem: Cardiac: Goal: Ability to achieve and maintain adequate cardiopulmonary perfusion will improve Outcome: Adequate for Discharge   Problem: Education: Goal: Ability to describe self-care measures that may prevent or decrease complications (Diabetes Survival Skills Education) will improve Outcome: Adequate for Discharge   Problem: Coping: Goal: Ability to adjust to condition or change in health will improve Outcome: Adequate for Discharge   Problem: Fluid Volume: Goal: Ability to maintain a balanced intake and output will improve Outcome: Adequate for Discharge   Problem: Health Behavior/Discharge  Planning: Goal: Ability to identify and utilize available resources and services will improve Outcome: Adequate for Discharge Goal: Ability to manage health-related needs will improve Outcome: Adequate for Discharge   Problem: Metabolic: Goal: Ability to maintain appropriate glucose levels will improve Outcome: Adequate for Discharge   Problem: Nutritional: Goal: Maintenance of adequate nutrition will improve Outcome: Adequate for Discharge Goal: Progress toward achieving an optimal weight will improve Outcome: Adequate for Discharge  Problem: Skin Integrity: Goal: Risk for impaired skin integrity will decrease Outcome: Adequate for Discharge   Problem: Tissue Perfusion: Goal: Adequacy of tissue perfusion will improve Outcome: Adequate for Discharge

## 2017-08-28 ENCOUNTER — Non-Acute Institutional Stay (SKILLED_NURSING_FACILITY): Payer: Medicare Other | Admitting: Internal Medicine

## 2017-08-28 ENCOUNTER — Encounter: Payer: Self-pay | Admitting: Internal Medicine

## 2017-08-28 DIAGNOSIS — I5023 Acute on chronic systolic (congestive) heart failure: Secondary | ICD-10-CM | POA: Diagnosis not present

## 2017-08-28 DIAGNOSIS — C50412 Malignant neoplasm of upper-outer quadrant of left female breast: Secondary | ICD-10-CM

## 2017-08-28 DIAGNOSIS — Z170 Estrogen receptor positive status [ER+]: Secondary | ICD-10-CM

## 2017-08-28 DIAGNOSIS — K529 Noninfective gastroenteritis and colitis, unspecified: Secondary | ICD-10-CM

## 2017-08-28 DIAGNOSIS — J9601 Acute respiratory failure with hypoxia: Secondary | ICD-10-CM | POA: Diagnosis not present

## 2017-08-28 DIAGNOSIS — I4891 Unspecified atrial fibrillation: Secondary | ICD-10-CM

## 2017-08-28 DIAGNOSIS — Z794 Long term (current) use of insulin: Secondary | ICD-10-CM

## 2017-08-28 DIAGNOSIS — I10 Essential (primary) hypertension: Secondary | ICD-10-CM

## 2017-08-28 DIAGNOSIS — N179 Acute kidney failure, unspecified: Secondary | ICD-10-CM | POA: Diagnosis not present

## 2017-08-28 DIAGNOSIS — I25119 Atherosclerotic heart disease of native coronary artery with unspecified angina pectoris: Secondary | ICD-10-CM

## 2017-08-28 DIAGNOSIS — E785 Hyperlipidemia, unspecified: Secondary | ICD-10-CM

## 2017-08-28 DIAGNOSIS — E1121 Type 2 diabetes mellitus with diabetic nephropathy: Secondary | ICD-10-CM

## 2017-08-28 NOTE — Progress Notes (Signed)
: Provider:  Noah Delaine. Sheppard Coil, MD Location:  [place] Room Number: [phone] Place of Service:  SNF ([phone])  PCP: Burnard Bunting, MD Patient Care Team: Burnard Bunting, MD as PCP - General (Internal Medicine) Bettina Gavia Hilton Cork, MD as PCP - Cardiology (Cardiology)  Extended Emergency Contact Information Primary Emergency Contact: Ollen Gross, [place] Phone: [phone] Mobile Phone: [phone] Relation: Daughter     Allergies: Cephalexin and Ivp dye [iodinated diagnostic agents]  Chief Complaint  Patient presents with  . New Admit To SNF    Admit to SNF    HPI: Patient is 84 y.o. female with coronary artery disease status post MI, CVA during cardiac catheterization for coronary disease, hypertension, hyperlipidemia, breast cancer status post left lumpectomy in April 2019 diabetes mellitus type 2 on insulin who presented to the emergency department with nausea vomiting diarrhea abdominal pain shortness of breath and orthopnea.  Patient had been doing well until 3 weeks ago when she began having exertional dyspnea and shortness of breath, lower extremity swelling and orthopnea.  She also had developed some erythema and redness to her left breast with for which she was started on Bactrim for presumed mastitis.  Yesterday she began to develop nonbilious nonbloody emesis x4's water perfused watery diarrhea without blood and right-sided crampy abdominal pain that was intermittent.  Patient was unable to tolerate p.o. in the ED patient was requiring 4 L nasal cannula, chest x-ray showed bilateral pleural effusions and CT abdomen pelvis was performed which showed no acute intra-abdominal process but did show cardiomegaly and pleural effusions.  White cell count was 14.7 and hemoglobin 9.2.  CMP showed hyperglycemia as well as mildly elevated serum creatinine of 1.12 and normal BUN but an alkaline Foss of 141.  Patient was  admitted to [facility] from 5/20-28 where she was treated for the exacerbation.  She developed A. fib with RVR which was a new finding which is treated with an amiodarone drip and controlled.  She was started on Eliquis for anti-coangulation and oral amiodarone.  It was felt that her nausea vomiting was acute gastroenteritis.  She also suffered acute kidney injury secondary to the Lasix required for the congestive heart failure but her kidney function is close to baseline at time of discharge patient is admitted to skilled nursing facility for OT/PT.  While at skilled nursing facility patient will be followed for hypertension treated with metoprolol and Micardis, coronary artery disease treated with aspirin Lopressor and statin and left breast cancer treated with tamoxifen.  Past Medical History:  Diagnosis Date  . Altered mental status 04/26/2011  . ANEMIA 03/07/2009   Qualifier: Diagnosis of  By: Burnett Kanaris    . Arthritis   . Basal cell carcinoma of skin of eyelid, including canthus 10/08/2012  . Breast cancer ([place]) 06/24/2017  . Coronary artery disease   . Depression    after passing of husband   . Diabetes mellitus   . Diverticulosis   . Dyslipidemia 09/23/2012  . GERD (gastroesophageal reflux disease)   . Gout   . Gout   . History of myocardial infarction 09/23/2012  . Hypertension   . IBS (irritable bowel syndrome)   . Malignant neoplasm of upper-outer quadrant of left breast in female, estrogen receptor positive ([place]) 05/22/2017  . Stroke [facility])     Past Surgical History:  Procedure Laterality Date  . ABDOMINAL HYSTERECTOMY    .  APPENDECTOMY    . BACK SURGERY    . BREAST LUMPECTOMY WITH RADIOACTIVE SEED AND SENTINEL LYMPH NODE BIOPSY Left 07/08/2017   Procedure: LEFT BREAST LUMPECTOMY WITH RADIOACTIVE SEED AND SENTINEL LYMPH NODE BIOPSY;  Surgeon: Fanny Skates, MD;  Location: [place];  Service: General;  Laterality: Left;  . cataract surgery    . COLONOSCOPY    . skin  cancer removed       Allergies as of 08/28/2017      Reactions   Cephalexin Hives   Ivp Dye [iodinated Diagnostic Agents] Hives      Medication List        Accurate as of 08/28/17  9:45 AM. Always use your most recent med list.          amiodarone 200 MG tablet Commonly known as:  PACERONE Take 1 tablet (200 mg total) by mouth 2 (two) times daily.   apixaban 2.5 MG Tabs tablet Commonly known as:  ELIQUIS Take 1 tablet (2.5 mg total) by mouth 2 (two) times daily.   aspirin EC 81 MG tablet Take 81 mg by mouth daily.   escitalopram 10 MG tablet Commonly known as:  LEXAPRO Take 10 mg by mouth daily.   HYDROcodone-acetaminophen 10-325 MG tablet Commonly known as:  NORCO Take 1 tablet by mouth every 4 (four) hours as needed. For pain.   insulin aspart 100 UNIT/ML injection Commonly known as:  novoLOG Inject into the skin 3 (three) times daily before meals. Sliding scale   insulin detemir 100 UNIT/ML injection Commonly known as:  LEVEMIR Inject 6 Units into the skin 2 (two) times daily.   metoprolol tartrate 25 MG tablet Commonly known as:  LOPRESSOR Take 0.5 tablets (12.5 mg total) by mouth 2 (two) times daily.   omeprazole 20 MG capsule Commonly known as:  PRILOSEC Take 20 mg by mouth daily.   polyethylene glycol packet Commonly known as:  MIRALAX / GLYCOLAX Take 17 g by mouth daily as needed for moderate constipation.   simvastatin 40 MG tablet Commonly known as:  ZOCOR Take 1 tablet (40 mg total) by mouth at bedtime.   tamoxifen 20 MG tablet Commonly known as:  NOLVADEX Take 1 tablet (20 mg total) by mouth daily.   telmisartan 20 MG tablet Commonly known as:  MICARDIS Take 1 tablet (20 mg total) by mouth daily.   temazepam 15 MG capsule Commonly known as:  RESTORIL Take 15 mg by mouth at bedtime as needed. For sleep.       No orders of the defined types were placed in this encounter.    There is no immunization history on file for this  patient.  Social History   Tobacco Use  . Smoking status: Never Smoker  . Smokeless tobacco: Never Used  Substance Use Topics  . Alcohol use: No    Comment: Unable to ascertain due to altered mental status.    Family history is   Family History  Problem Relation Age of Onset  . Breast cancer Daughter 21  . Cancer Mother        melanoma sinus  . Cancer Father        colon      Review of Systems  DATA OBTAINED: from patient, nurse GENERAL:  no fevers, fatigue, appetite changes SKIN: No itching, or rash EYES: No eye pain, redness, discharge EARS: No earache, tinnitus, change in hearing NOSE: No congestion, drainage or bleeding  MOUTH/THROAT: No mouth or tooth pain, No sore throat RESPIRATORY:  No cough, wheezing, SOB CARDIAC: No chest pain, palpitations, lower extremity edema  GI: No abdominal pain, No N/V/D or constipation, No heartburn or reflux  GU: No dysuria, frequency or urgency, or incontinence  MUSCULOSKELETAL: No unrelieved bone/joint pain NEUROLOGIC: No headache, dizziness or focal weakness PSYCHIATRIC: No c/o anxiety or sadness   Vitals:   08/28/17 0935  BP: 137/64  Pulse: 65  Resp: 14  Temp: 98.1 F (36.7 C)  SpO2: 95%    SpO2 Readings from Last 1 Encounters:  08/28/17 95%   Body mass index is 23.96 kg/m.     Physical Exam  GENERAL APPEARANCE: Alert, conversant,  No acute distress.  SKIN: No diaphoresis rash HEAD: Normocephalic, atraumatic  EYES: Conjunctiva/lids clear. Pupils round, reactive. EOMs intact.  EARS: External exam WNL, canals clear. Hearing grossly normal.  NOSE: No deformity or discharge.  MOUTH/THROAT: Lips w/o lesions  RESPIRATORY: Breathing is even, unlabored. Lung sounds are clear   CARDIOVASCULAR: Heart RRR no murmurs, rubs or gallops. No peripheral edema.   GASTROINTESTINAL: Abdomen is soft, non-tender, not distended w/ normal bowel sounds. GENITOURINARY: Bladder non tender, not distended  MUSCULOSKELETAL: No abnormal  joints or musculature NEUROLOGIC:  Cranial nerves 2-12 grossly intact. Moves all extremities  PSYCHIATRIC: Mood and affect appropriate to situation, no behavioral issues  Patient Active Problem List   Diagnosis Date Noted  . Atrial fibrillation with RVR ([place]) 08/22/2017  . Acute on chronic systolic CHF (congestive heart failure) ([place]) 08/21/2017  . Acute hypoxemic respiratory failure ([place]) 08/19/2017  . Nausea and vomiting 08/19/2017  . Breast cancer ([place]) 06/24/2017  . Preoperative cardiovascular examination 06/24/2017  . Malignant neoplasm of upper-outer quadrant of left breast in female, estrogen receptor positive ([place]) 05/22/2017  . Basal cell carcinoma of skin of eyelid, including canthus 10/08/2012  . Dyslipidemia 09/23/2012  . H/O: CVA (cerebrovascular accident) 09/23/2012  . History of gastroesophageal reflux (GERD) 09/23/2012  . History of myocardial infarction 09/23/2012  . Diabetes mellitus ([place]) 09/23/2012  . HTN (hypertension) 09/23/2012  . Altered mental status 04/26/2011  . DM 03/07/2009  . Hyperlipidemia 03/07/2009  . ANEMIA 03/07/2009  . Essential hypertension 03/07/2009  . Coronary artery disease involving native coronary artery of native heart with angina pectoris ([place]) 03/07/2009  . Cerebral artery occlusion with cerebral infarction ([place]) 03/07/2009  . GERD 03/07/2009  . IRRITABLE BOWEL SYNDROME 03/07/2009      Labs reviewed: Basic Metabolic Panel:    Component Value Date/Time   NA 135 08/25/2017 0316   K 4.4 08/25/2017 0316   CL 96 (L) 08/25/2017 0316   CO2 29 08/25/2017 0316   GLUCOSE 137 (H) 08/25/2017 0316   BUN 25 (H) 08/25/2017 0316   CREATININE 1.25 (H) 08/25/2017 0316   CALCIUM 8.2 (L) 08/25/2017 0316   PROT 7.0 08/19/2017 0933   ALBUMIN 3.8 08/19/2017 0933   AST 26 08/19/2017 0933   ALT 12 (L) 08/19/2017 0933   ALKPHOS 141 (H) 08/19/2017 0933   BILITOT 0.6 08/19/2017 0933   GFRNONAA 39 (L) 08/25/2017 0316   GFRAA 45 (L) 08/25/2017 0316      Recent Labs    08/22/17 0313 08/23/17 0303 08/25/17 0316  NA 134* 134* 135  K 3.9 4.6 4.4  CL 94* 94* 96*  CO2 28 29 29   GLUCOSE 293* 297* 137*  BUN 24* 28* 25*  CREATININE 1.47* 1.41* 1.25*  CALCIUM 7.7* 8.0* 8.2*   Liver Function Tests: Recent Labs    07/04/17 1139 08/19/17 0933  AST 16 26  ALT 7* 12*  ALKPHOS 128* 141*  BILITOT 0.5 0.6  PROT 6.8 7.0  ALBUMIN 3.7 3.8   Recent Labs    08/19/17 0933  LIPASE 17   No results for input(s): AMMONIA in the last 8760 hours. CBC: Recent Labs    07/04/17 1139  08/20/17 0654 08/21/17 0302 08/23/17 0303  WBC 5.8   < > 9.7 11.9* 9.6  NEUTROABS 3.1  --   --  9.9* 5.5  HGB 10.3*   < > 9.4* 9.8* 11.2*  HCT 32.9*   < > 30.1* 30.8* 34.4*  MCV 95.6   < > 93.2 90.9 90.5  PLT 409*   < > 382 426* 429*   < > = values in this interval not displayed.   Lipid No results for input(s): CHOL, HDL, LDLCALC, TRIG in the last 8760 hours.  Cardiac Enzymes: Recent Labs    08/20/17 0020 08/20/17 0654 08/21/17 0302  TROPONINI 0.09* 0.09* 0.06*   BNP: Recent Labs    08/19/17 0933  BNP 409.6*   No results found for: [facility] Lab Results  Component Value Date   HGBA1C 7.7 (H) 07/04/2017   Lab Results  Component Value Date   TSH 1.988 08/19/2017   No results found for: VITAMINB12 No results found for: FOLATE No results found for: IRON, TIBC, FERRITIN  Imaging and Procedures obtained prior to SNF admission: Ct Abdomen Pelvis Wo Contrast  Result Date: 08/19/2017 CLINICAL DATA:  Acute abdominal pain. Nausea, vomiting, and diarrhea. History of breast cancer with lumpectomy July 08, 2017. EXAM: CT ABDOMEN AND PELVIS WITHOUT CONTRAST TECHNIQUE: Multidetector CT imaging of the abdomen and pelvis was performed following the standard protocol without IV contrast. COMPARISON:  April 27, 2011 FINDINGS: Lower chest: Skin thickening in the inferior left breast, incompletely evaluated. Small to moderate bilateral pleural  effusions. Large hiatal hernia. Mild cardiomegaly. A calcified nodule in the lingula is of no significance. Opacity under the bilateral effusions is likely atelectasis. No suspicious infiltrate identified. Hepatobiliary: No focal liver abnormality is seen. No gallstones, gallbladder wall thickening, or biliary dilatation. Pancreas: Unremarkable. No pancreatic ductal dilatation or surrounding inflammatory changes. Spleen: Normal in size without focal abnormality. Adrenals/Urinary Tract: Adrenal glands are normal. No renal stones or hydronephrosis. No suspicious masses. The ureters and bladder are normal. Stomach/Bowel: Other than the hiatal hernia, the stomach is normal. The small bowel is normal. Colonic diverticulosis is seen without diverticulitis. The colon is normal. The appendix is been removed by report. Vascular/Lymphatic: Atherosclerotic changes are seen in the nonaneurysmal tortuous aorta and iliac vessels. No adenopathy. Reproductive: Status post hysterectomy. No adnexal masses. Other: No free air or free fluid. Increased attenuation in the subcutaneous fat diffusely suggests volume overload. Musculoskeletal: Evaluation of the left hip and pelvic bones is limited due to motion. Cortical malalignment in the left pelvic bones on series 4, image 47 is thought to be artifactual due to motion. Recommend clinical correlation. Grade 1 anterolisthesis of L4 versus L5. No fractures or traumatic malalignment. IMPRESSION: 1. No cause for the patient's abdominal symptoms identified. 2. Skin thickening in the inferior left breast is incompletely evaluated. This could be due to recent surgery or radiation. Recommend clinical correlation. Given history, if there is concern for a breast infection, ultrasound could better evaluate. 3. Large hiatal hernia. 4. Mild cardiomegaly, pleural effusions, and subcutaneous fat edema suggests volume overload. 5. Colonic diverticulosis without diverticulitis. 6. Atherosclerotic changes  in the aorta and iliac vessels. Electronically Signed   By: Dorise Bullion  III M.D   On: 08/19/2017 15:23   Dg Chest Port 1 View  Result Date: 08/20/2017 CLINICAL DATA:  Shortness of breath, chest pain. EXAM: PORTABLE CHEST 1 VIEW COMPARISON:  Radiographs Aug 19, 2017. FINDINGS: Stable cardiomegaly with central pulmonary vascular congestion. Bibasilar interstitial densities are noted concerning for edema and possible mild pleural effusions. No pneumothorax is noted. Bony thorax is unremarkable. IMPRESSION: Stable cardiomegaly with central pulmonary vascular congestion. Probable mild bibasilar pulmonary edema is noted with small pleural effusions. Electronically Signed   By: Marijo Conception, M.D.   On: 08/20/2017 08:42   Dg Abdomen Acute W/chest  Result Date: 08/19/2017 CLINICAL DATA:  Wheezing, congestion. EXAM: DG ABDOMEN ACUTE W/ 1V CHEST COMPARISON:  Radiographs of June 20, 2011. FINDINGS: Mild cardiomegaly is noted with central pulmonary vascular congestion. Probable mild diffuse bilateral pulmonary edema is noted. No pneumothorax is noted. No significant pleural effusion is noted. No free air is noted to suggest pneumoperitoneum. No abnormal bowel gas pattern is noted. No abnormal calcifications are noted. IMPRESSION: No evidence of bowel obstruction or ileus. Mild cardiomegaly is noted with mild central pulmonary vascular congestion and probable bilateral pulmonary edema. Electronically Signed   By: Marijo Conception, M.D.   On: 08/19/2017 09:29     Not all labs, radiology exams or other studies done during hospitalization come through on my EPIC note; however they are reviewed by me.    Assessment and Plan  Acute hypoxic respiratory failure/acute on chronic systolic heart failure-mild elevation in BNP, chest x-ray with pulmonary vascular congestion, echo with ejection fraction of 45 to 50%; was on oral Lasix but he had been stopped due to hypotension a few days prior; continue our SNF  -admit for OT/PT; continue metoprolol 12.5 mg twice daily, and Micardis 20 mg daily  Atrial fib with RVR-new; treated with amiodarone IV initially then oral SNF -continue amiodarone 200 mg twice daily and metoprolol 12.5 mg twice daily  Acute gastroenteritis-GI pathogen panel negative C. difficile negative CT abdomen pelvis without acute changes, history of recent Bactrim use, diarrhea has stopped  Acute kidney injury-patient was on Lasix then not due to hypotension; either could have caused acute kidney injury; kidney function at baseline on discharge SNF -follow-up BMP  Coronary artery disease- patient no complaints of chest pain SNF -continue metoprolol 12.5 mg twice daily, ASA 81 mg daily and statin  Hypertension SNF -controlled; will need to watch for hypotension; continue metoprolol 12.5 mg twice daily and Micardis 20 mg daily  Hyperlipidemia SNF -not stated as uncontrolled; continue Zocor 40 mg daily  Left breast cancer SNF -continue tamoxifen 20 mg daily  Diabetes mellitus type 2 SNF -hemoglobin A1c 7.7; continue Levemir 6 units twice daily and NovoLog sliding scale insulin    Time spent greater than 45 minutes;> 50% of time with patient was spent reviewing records, labs, tests and studies, counseling and developing plan of care  Webb Silversmith D. Sheppard Coil, MD

## 2017-08-31 LAB — BASIC METABOLIC PANEL
BUN: 39 — AB (ref 4–21)
Creatinine: 1.6 — AB (ref 0.5–1.1)
GLUCOSE: 244
POTASSIUM: 4.8 (ref 3.4–5.3)
SODIUM: 138 (ref 137–147)

## 2017-09-01 ENCOUNTER — Encounter: Payer: Self-pay | Admitting: Internal Medicine

## 2017-09-01 DIAGNOSIS — K529 Noninfective gastroenteritis and colitis, unspecified: Secondary | ICD-10-CM | POA: Insufficient documentation

## 2017-09-01 DIAGNOSIS — N179 Acute kidney failure, unspecified: Secondary | ICD-10-CM | POA: Insufficient documentation

## 2017-09-03 ENCOUNTER — Encounter: Payer: Self-pay | Admitting: Internal Medicine

## 2017-09-03 ENCOUNTER — Non-Acute Institutional Stay (SKILLED_NURSING_FACILITY): Payer: Medicare Other | Admitting: Internal Medicine

## 2017-09-03 DIAGNOSIS — E1165 Type 2 diabetes mellitus with hyperglycemia: Secondary | ICD-10-CM

## 2017-09-03 DIAGNOSIS — Z794 Long term (current) use of insulin: Secondary | ICD-10-CM

## 2017-09-03 NOTE — Progress Notes (Signed)
Location:  [facility] Room Number: [place] of Service:  SNF [phone])  Hennie Duos, MD  Patient Care Team: Burnard Bunting, MD as PCP - General (Internal Medicine) Bettina Gavia Hilton Cork, MD as PCP - Cardiology (Cardiology)  Extended Emergency Contact Information Primary Emergency Contact: Ollen Gross, [place] Phone: [phone] Mobile Phone: [phone] Relation: Daughter    Allergies: Cephalexin and Ivp dye [iodinated diagnostic agents]  Chief Complaint  Patient presents with  . Acute Visit    Elevated Blood Sugars    HPI: Patient is 84 y.o. female who is being seen for elevated blood sugars.  Blood sugars are running in the mid 300s occasionally elevating into the 400s and going as low as 130 on one occasion.  Patient is having no symptoms and is unaware.  Nursing has noted no changes in patient with blood sugars.  Patient is currently on Levemir 6 units in the morning and 6 units in the evening with sliding scale insulin coverage.  Past Medical History:  Diagnosis Date  . Altered mental status 04/26/2011  . ANEMIA 03/07/2009   Qualifier: Diagnosis of  By: Burnett Kanaris    . Arthritis   . Basal cell carcinoma of skin of eyelid, including canthus 10/08/2012  . Breast cancer ([place]) 06/24/2017  . Coronary artery disease   . Depression    after passing of husband   . Diabetes mellitus   . Diverticulosis   . Dyslipidemia 09/23/2012  . GERD (gastroesophageal reflux disease)   . Gout   . Gout   . History of myocardial infarction 09/23/2012  . Hypertension   . IBS (irritable bowel syndrome)   . Malignant neoplasm of upper-outer quadrant of left breast in female, estrogen receptor positive ([place]) 05/22/2017  . Stroke [facility])     Past Surgical History:  Procedure Laterality Date  . ABDOMINAL HYSTERECTOMY    . APPENDECTOMY    . BACK SURGERY    . BREAST LUMPECTOMY WITH RADIOACTIVE SEED AND  SENTINEL LYMPH NODE BIOPSY Left 07/08/2017   Procedure: LEFT BREAST LUMPECTOMY WITH RADIOACTIVE SEED AND SENTINEL LYMPH NODE BIOPSY;  Surgeon: Fanny Skates, MD;  Location: [facility];  Service: General;  Laterality: Left;  . cataract surgery    . COLONOSCOPY    . skin cancer removed       Allergies as of 09/03/2017      Reactions   Cephalexin Hives   Ivp Dye [iodinated Diagnostic Agents] Hives      Medication List        Accurate as of 09/03/17  2:25 PM. Always use your most recent med list.          amiodarone 200 MG tablet Commonly known as:  PACERONE Take 1 tablet (200 mg total) by mouth 2 (two) times daily.   apixaban 2.5 MG Tabs tablet Commonly known as:  ELIQUIS Take 1 tablet (2.5 mg total) by mouth 2 (two) times daily.   aspirin EC 81 MG tablet Take 81 mg by mouth daily.   escitalopram 10 MG tablet Commonly known as:  LEXAPRO Take 10 mg by mouth daily.   HYDROcodone-acetaminophen 10-325 MG tablet Commonly known as:  NORCO Take 1 tablet by mouth every 4 (four) hours as needed. For pain.   insulin aspart 100 UNIT/ML injection Commonly known as:  novoLOG Inject into the skin 3 (three) times daily before meals. Sliding scale  insulin detemir 100 UNIT/ML injection Commonly known as:  LEVEMIR Inject 6 Units into the skin 2 (two) times daily.   metoprolol tartrate 25 MG tablet Commonly known as:  LOPRESSOR Take 0.5 tablets (12.5 mg total) by mouth 2 (two) times daily.   omeprazole 20 MG capsule Commonly known as:  PRILOSEC Take 20 mg by mouth daily.   polyethylene glycol packet Commonly known as:  MIRALAX / GLYCOLAX Take 17 g by mouth daily as needed for moderate constipation.   simvastatin 40 MG tablet Commonly known as:  ZOCOR Take 1 tablet (40 mg total) by mouth at bedtime.   tamoxifen 20 MG tablet Commonly known as:  NOLVADEX Take 1 tablet (20 mg total) by mouth daily.   telmisartan 20 MG tablet Commonly known as:  MICARDIS Take 1 tablet (20 mg  total) by mouth daily.   temazepam 15 MG capsule Commonly known as:  RESTORIL Take 15 mg by mouth at bedtime as needed. For sleep.       No orders of the defined types were placed in this encounter.    There is no immunization history on file for this patient.  Social History   Tobacco Use  . Smoking status: Never Smoker  . Smokeless tobacco: Never Used  Substance Use Topics  . Alcohol use: No    Comment: Unable to ascertain due to altered mental status.    Review of Systems  DATA OBTAINED: from patient, nurse GENERAL:  no fevers, fatigue, appetite changes SKIN: No itching, rash HEENT: No complaint RESPIRATORY: No cough, wheezing, SOB CARDIAC: No chest pain, palpitations, lower extremity edema  GI: No abdominal pain, No N/V/D or constipation, No heartburn or reflux  GU: No dysuria, frequency or urgency, or incontinence  MUSCULOSKELETAL: No unrelieved bone/joint pain NEUROLOGIC: No headache, dizziness  PSYCHIATRIC: No overt anxiety or sadness  Vitals:   09/03/17 1404  BP: 132/72  Pulse: 71  Resp: 18  Temp: (!) 97.1 F (36.2 C)   Body mass index is 23.26 kg/m. Physical Exam  GENERAL APPEARANCE: Alert, conversant, No acute distress  SKIN: No diaphoresis rash HEENT: Unremarkable RESPIRATORY: Breathing is even, unlabored. Lung sounds are clear   CARDIOVASCULAR: Heart RRR no murmurs, rubs or gallops. No peripheral edema  GASTROINTESTINAL: Abdomen is soft, non-tender, not distended w/ normal bowel sounds.  GENITOURINARY: Bladder non tender, not distended  MUSCULOSKELETAL: No abnormal joints or musculature NEUROLOGIC: Cranial nerves 2-12 grossly intact. Moves all extremities PSYCHIATRIC: Mood and affect appropriate to situation, no behavioral issues  Patient Active Problem List   Diagnosis Date Noted  . Acute kidney injury ([place]) 09/01/2017  . Gastroenteritis 09/01/2017  . Atrial fibrillation with RVR ([place]) 08/22/2017  . Acute on chronic systolic CHF  (congestive heart failure) ([place]) 08/21/2017  . Acute hypoxemic respiratory failure ([place]) 08/19/2017  . Nausea and vomiting 08/19/2017  . Breast cancer ([place]) 06/24/2017  . Preoperative cardiovascular examination 06/24/2017  . Malignant neoplasm of upper-outer quadrant of left breast in female, estrogen receptor positive ([place]) 05/22/2017  . Basal cell carcinoma of skin of eyelid, including canthus 10/08/2012  . Dyslipidemia 09/23/2012  . H/O: CVA (cerebrovascular accident) 09/23/2012  . History of gastroesophageal reflux (GERD) 09/23/2012  . History of myocardial infarction 09/23/2012  . Diabetes mellitus ([place]) 09/23/2012  . HTN (hypertension) 09/23/2012  . Altered mental status 04/26/2011  . DM 03/07/2009  . Hyperlipidemia 03/07/2009  . ANEMIA 03/07/2009  . Essential hypertension 03/07/2009  . Coronary artery disease involving native coronary artery of native heart with  angina pectoris ([place]) 03/07/2009  . Cerebral artery occlusion with cerebral infarction ([place]) 03/07/2009  . GERD 03/07/2009  . IRRITABLE BOWEL SYNDROME 03/07/2009    CMP     Component Value Date/Time   NA 135 08/25/2017 0316   K 4.4 08/25/2017 0316   CL 96 (L) 08/25/2017 0316   CO2 29 08/25/2017 0316   GLUCOSE 137 (H) 08/25/2017 0316   BUN 25 (H) 08/25/2017 0316   CREATININE 1.25 (H) 08/25/2017 0316   CALCIUM 8.2 (L) 08/25/2017 0316   PROT 7.0 08/19/2017 0933   ALBUMIN 3.8 08/19/2017 0933   AST 26 08/19/2017 0933   ALT 12 (L) 08/19/2017 0933   ALKPHOS 141 (H) 08/19/2017 0933   BILITOT 0.6 08/19/2017 0933   GFRNONAA 39 (L) 08/25/2017 0316   GFRAA 45 (L) 08/25/2017 0316   Recent Labs    08/22/17 0313 08/23/17 0303 08/25/17 0316  NA 134* 134* 135  K 3.9 4.6 4.4  CL 94* 94* 96*  CO2 28 29 29   GLUCOSE 293* 297* 137*  BUN 24* 28* 25*  CREATININE 1.47* 1.41* 1.25*  CALCIUM 7.7* 8.0* 8.2*   Recent Labs    07/04/17 1139 08/19/17 0933  AST 16 26  ALT 7* 12*  ALKPHOS 128* 141*  BILITOT 0.5 0.6    PROT 6.8 7.0  ALBUMIN 3.7 3.8   Recent Labs    07/04/17 1139  08/20/17 0654 08/21/17 0302 08/23/17 0303  WBC 5.8   < > 9.7 11.9* 9.6  NEUTROABS 3.1  --   --  9.9* 5.5  HGB 10.3*   < > 9.4* 9.8* 11.2*  HCT 32.9*   < > 30.1* 30.8* 34.4*  MCV 95.6   < > 93.2 90.9 90.5  PLT 409*   < > 382 426* 429*   < > = values in this interval not displayed.   No results for input(s): CHOL, LDLCALC, TRIG in the last 8760 hours.  Invalid input(s): HCL No results found for: [facility] Lab Results  Component Value Date   TSH 1.988 08/19/2017   Lab Results  Component Value Date   HGBA1C 7.7 (H) 07/04/2017   Lab Results  Component Value Date   CHOL 127 09/08/2007   HDL 60.9 09/08/2007   LDLCALC 57 09/08/2007   TRIG 45 09/08/2007   CHOLHDL 2.1 CALC 09/08/2007    Significant Diagnostic Results in last 30 days:  Ct Abdomen Pelvis Wo Contrast  Result Date: 08/19/2017 CLINICAL DATA:  Acute abdominal pain. Nausea, vomiting, and diarrhea. History of breast cancer with lumpectomy July 08, 2017. EXAM: CT ABDOMEN AND PELVIS WITHOUT CONTRAST TECHNIQUE: Multidetector CT imaging of the abdomen and pelvis was performed following the standard protocol without IV contrast. COMPARISON:  April 27, 2011 FINDINGS: Lower chest: Skin thickening in the inferior left breast, incompletely evaluated. Small to moderate bilateral pleural effusions. Large hiatal hernia. Mild cardiomegaly. A calcified nodule in the lingula is of no significance. Opacity under the bilateral effusions is likely atelectasis. No suspicious infiltrate identified. Hepatobiliary: No focal liver abnormality is seen. No gallstones, gallbladder wall thickening, or biliary dilatation. Pancreas: Unremarkable. No pancreatic ductal dilatation or surrounding inflammatory changes. Spleen: Normal in size without focal abnormality. Adrenals/Urinary Tract: Adrenal glands are normal. No renal stones or hydronephrosis. No suspicious masses. The ureters and  bladder are normal. Stomach/Bowel: Other than the hiatal hernia, the stomach is normal. The small bowel is normal. Colonic diverticulosis is seen without diverticulitis. The colon is normal. The appendix is been removed by report. Vascular/Lymphatic:  Atherosclerotic changes are seen in the nonaneurysmal tortuous aorta and iliac vessels. No adenopathy. Reproductive: Status post hysterectomy. No adnexal masses. Other: No free air or free fluid. Increased attenuation in the subcutaneous fat diffusely suggests volume overload. Musculoskeletal: Evaluation of the left hip and pelvic bones is limited due to motion. Cortical malalignment in the left pelvic bones on series 4, image 47 is thought to be artifactual due to motion. Recommend clinical correlation. Grade 1 anterolisthesis of L4 versus L5. No fractures or traumatic malalignment. IMPRESSION: 1. No cause for the patient's abdominal symptoms identified. 2. Skin thickening in the inferior left breast is incompletely evaluated. This could be due to recent surgery or radiation. Recommend clinical correlation. Given history, if there is concern for a breast infection, ultrasound could better evaluate. 3. Large hiatal hernia. 4. Mild cardiomegaly, pleural effusions, and subcutaneous fat edema suggests volume overload. 5. Colonic diverticulosis without diverticulitis. 6. Atherosclerotic changes in the aorta and iliac vessels. Electronically Signed   By: Dorise Bullion III M.D   On: 08/19/2017 15:23   Dg Chest Port 1 View  Result Date: 08/20/2017 CLINICAL DATA:  Shortness of breath, chest pain. EXAM: PORTABLE CHEST 1 VIEW COMPARISON:  Radiographs Aug 19, 2017. FINDINGS: Stable cardiomegaly with central pulmonary vascular congestion. Bibasilar interstitial densities are noted concerning for edema and possible mild pleural effusions. No pneumothorax is noted. Bony thorax is unremarkable. IMPRESSION: Stable cardiomegaly with central pulmonary vascular congestion. Probable  mild bibasilar pulmonary edema is noted with small pleural effusions. Electronically Signed   By: Marijo Conception, M.D.   On: 08/20/2017 08:42   Dg Abdomen Acute W/chest  Result Date: 08/19/2017 CLINICAL DATA:  Wheezing, congestion. EXAM: DG ABDOMEN ACUTE W/ 1V CHEST COMPARISON:  Radiographs of June 20, 2011. FINDINGS: Mild cardiomegaly is noted with central pulmonary vascular congestion. Probable mild diffuse bilateral pulmonary edema is noted. No pneumothorax is noted. No significant pleural effusion is noted. No free air is noted to suggest pneumoperitoneum. No abnormal bowel gas pattern is noted. No abnormal calcifications are noted. IMPRESSION: No evidence of bowel obstruction or ileus. Mild cardiomegaly is noted with mild central pulmonary vascular congestion and probable bilateral pulmonary edema. Electronically Signed   By: Marijo Conception, M.D.   On: 08/19/2017 09:29    Assessment and Plan  Diabetes mellitus type 2 with hyperglycemia- after review of all blood sugars since admission will increase Levemir to 12 units in the morning and 9 units at dinner and continue sliding scale insulin as before; will review in 1 week    Hennie Duos, MD

## 2017-09-06 ENCOUNTER — Ambulatory Visit: Payer: Medicare Other | Admitting: Cardiology

## 2017-09-06 DIAGNOSIS — N189 Chronic kidney disease, unspecified: Secondary | ICD-10-CM | POA: Insufficient documentation

## 2017-09-06 DIAGNOSIS — Z79899 Other long term (current) drug therapy: Secondary | ICD-10-CM | POA: Insufficient documentation

## 2017-09-06 DIAGNOSIS — Z7901 Long term (current) use of anticoagulants: Secondary | ICD-10-CM | POA: Insufficient documentation

## 2017-09-06 NOTE — Progress Notes (Deleted)
Cardiology Office Note:    Date:  09/06/2017   ID:  [name], DOB 11-26-1932, MRN [id number]  PCP:  Burnard Bunting, MD  Cardiologist:  Shirlee More, MD    Referring MD: Burnard Bunting, MD    ASSESSMENT:    1. Chronic diastolic heart failure ([place])   2. On amiodarone therapy   3. Chronic anticoagulation   4. Hypertensive heart disease with heart failure ([place])   5. Coronary artery disease involving native coronary artery of native heart with angina pectoris ([place])   6. Chronic kidney disease, unspecified CKD stage    PLAN:    In order of problems listed above:  1. ***   Next appointment: ***   Medication Adjustments/Labs and Tests Ordered: Current medicines are reviewed at length with the patient today.  Concerns regarding medicines are outlined above.  No orders of the defined types were placed in this encounter.  No orders of the defined types were placed in this encounter.   No chief complaint on file.   History of Present Illness:    [name] is a 84 y.o. female with a hx of heart failure EF 45-50%, paroxysmal atrial fibrillation, CAD status post MI, CVA during cardiac cath, hypertension, hyperlipidemia, breast cancer S/P left lumpectomy in 07/2017, DM type II  last seen during her admission in May 2019.  I had last seen her in the office 06/25/2017 prior to breast surgery has started on ARB for hypertension and a statin with her CAD and hyperlipidemia.  Dr [name] progress note 08/27/17: 84 y.o. female with history of CAD, and following stroke at time of cardiac catheterization in 2006, hypertension, type, hyperlipidemia, left breast cancer. She is admitted with weakness associated with abdominal pain, nausea, and diarrhea. Cardiology service following secondary to elevated troponin I,acute on chronic combined heart failure, and persistent atrial fibrillation.  Assessment & Plan    1.  Paroxysmal to persistent atrial fibrillation, initially with  RVR and newly diagnosed.  CHADSVASC score is 9 and she is currently on Eliquis for stroke prophylaxis.  She has converted to sinus rhythm on IV amiodarone and low-dose Lopressor. She maintains SR in at least last 48 hours.  2.  CAD without active angina symptoms.  She is on statin therapy.  Aspirin discontinued with use of Eliquis. 3.  Minor elevation in troponin I, flat pattern suggestive of demand ischemia and/or heart failure rather than ACS. nShe denies any chest pain.  4.  Acute on chronic combined heart failure, LVEF 25 to 50%.  Lasix has been discontinued. Weight stable, negative 1.4 L, I would continue to hold lasix. 5. Hypertension - increase lopressor 25 mg po bid, added amlodipine 2.5 mg po daily, first day today, we will follow.  Compliance with diet, lifestyle and medications: *** Past Medical History:  Diagnosis Date  . Altered mental status 04/26/2011  . ANEMIA 03/07/2009   Qualifier: Diagnosis of  By: Burnett Kanaris    . Arthritis   . Basal cell carcinoma of skin of eyelid, including canthus 10/08/2012  . Breast cancer ([place]) 06/24/2017  . Coronary artery disease   . Depression    after passing of husband   . Diabetes mellitus   . Diverticulosis   . Dyslipidemia 09/23/2012  . GERD (gastroesophageal reflux disease)   . Gout   . Gout   . History of myocardial infarction 09/23/2012  . Hypertension   . IBS (irritable bowel syndrome)   . Malignant neoplasm of upper-outer quadrant of left  breast in female, estrogen receptor positive ([place]) 05/22/2017  . Stroke [facility])     Past Surgical History:  Procedure Laterality Date  . ABDOMINAL HYSTERECTOMY    . APPENDECTOMY    . BACK SURGERY    . BREAST LUMPECTOMY WITH RADIOACTIVE SEED AND SENTINEL LYMPH NODE BIOPSY Left 07/08/2017   Procedure: LEFT BREAST LUMPECTOMY WITH RADIOACTIVE SEED AND SENTINEL LYMPH NODE BIOPSY;  Surgeon: Fanny Skates, MD;  Location: [facility];  Service: General;  Laterality: Left;  . cataract surgery    .  COLONOSCOPY    . skin cancer removed       Current Medications: No outpatient medications have been marked as taking for the 09/06/17 encounter (Appointment) with Richardo Priest, MD.     Allergies:   Cephalexin and Ivp dye [iodinated diagnostic agents]   Social History   Socioeconomic History  . Marital status: Married    Spouse name: Not on file  . Number of children: Not on file  . Years of education: Not on file  . Highest education level: Not on file  Occupational History  . Not on file  Social Needs  . Financial resource strain: Not on file  . Food insecurity:    Worry: Not on file    Inability: Not on file  . Transportation needs:    Medical: Not on file    Non-medical: Not on file  Tobacco Use  . Smoking status: Never Smoker  . Smokeless tobacco: Never Used  Substance and Sexual Activity  . Alcohol use: No    Comment: Unable to ascertain due to altered mental status.  . Drug use: No  . Sexual activity: Not on file  Lifestyle  . Physical activity:    Days per week: Not on file    Minutes per session: Not on file  . Stress: Not on file  Relationships  . Social connections:    Talks on phone: Not on file    Gets together: Not on file    Attends religious service: Not on file    Active member of club or organization: Not on file    Attends meetings of clubs or organizations: Not on file    Relationship status: Not on file  Other Topics Concern  . Not on file  Social History Narrative  . Not on file     Family History: The patient's ***family history includes Breast cancer (age of onset: 72) in her daughter; Cancer in her father and mother. ROS:   Please see the history of present illness.    All other systems reviewed and are negative.  EKGs/Labs/Other Studies Reviewed:    The following studies were reviewed today:  EKG:  EKG ordered today.  The ekg ordered today demonstrates ***  Echocardiogram 08/20/2017: Study Conclusions - Left ventricle: The  cavity size was normal. Wall thickness was normal. Systolic function was mildly reduced. The estimated ejection fraction was in the range of 45% to 50%. There is akinesis of the inferoseptal myocardium. There was no evidence of elevated ventricular filling pressure by Doppler parameters. - Mitral valve: There was moderate regurgitation directed posteriorly. - Left atrium: The atrium was severely dilated. - Tricuspid valve: There was moderate regurgitation. - Pulmonary arteries: Systolic pressure was moderately increased. PA peak pressure: 57 mm Hg (S).  Recent Labs: 08/19/2017: ALT 12; B Natriuretic Peptide 409.6; TSH 1.988 08/23/2017: Hemoglobin 11.2; Platelets 429 08/31/2017: BUN 39; Creatinine 1.6; Potassium 4.8; Sodium 138  Recent Lipid Panel    Component  Value Date/Time   CHOL 127 09/08/2007 0956   TRIG 45 09/08/2007 0956   HDL 60.9 09/08/2007 0956   CHOLHDL 2.1 CALC 09/08/2007 0956   VLDL 9 09/08/2007 0956   LDLCALC 57 09/08/2007 0956    Physical Exam:    VS:  There were no vitals taken for this visit.    Wt Readings from Last 3 Encounters:  09/03/17 139 lb 12.8 oz (63.4 kg)  08/28/17 143 lb 15.4 oz (65.3 kg)  08/27/17 146 lb 6.2 oz (66.4 kg)     GEN: *** Well nourished, well developed in no acute distress HEENT: Normal NECK: No JVD; No carotid bruits LYMPHATICS: No lymphadenopathy CARDIAC: ***RRR, no murmurs, rubs, gallops RESPIRATORY:  Clear to auscultation without rales, wheezing or rhonchi  ABDOMEN: Soft, non-tender, non-distended MUSCULOSKELETAL:  No edema; No deformity  SKIN: Warm and dry NEUROLOGIC:  Alert and oriented x 3 PSYCHIATRIC:  Normal affect    Signed, Shirlee More, MD  09/06/2017 10:58 AM    [place]

## 2017-09-10 ENCOUNTER — Non-Acute Institutional Stay (SKILLED_NURSING_FACILITY): Payer: Medicare Other | Admitting: Internal Medicine

## 2017-09-10 ENCOUNTER — Encounter: Payer: Self-pay | Admitting: Internal Medicine

## 2017-09-10 DIAGNOSIS — I5023 Acute on chronic systolic (congestive) heart failure: Secondary | ICD-10-CM

## 2017-09-10 DIAGNOSIS — I4891 Unspecified atrial fibrillation: Secondary | ICD-10-CM

## 2017-09-10 DIAGNOSIS — J9601 Acute respiratory failure with hypoxia: Secondary | ICD-10-CM | POA: Diagnosis not present

## 2017-09-10 DIAGNOSIS — K529 Noninfective gastroenteritis and colitis, unspecified: Secondary | ICD-10-CM

## 2017-09-10 DIAGNOSIS — E785 Hyperlipidemia, unspecified: Secondary | ICD-10-CM

## 2017-09-10 DIAGNOSIS — E1121 Type 2 diabetes mellitus with diabetic nephropathy: Secondary | ICD-10-CM

## 2017-09-10 DIAGNOSIS — I110 Hypertensive heart disease with heart failure: Secondary | ICD-10-CM

## 2017-09-10 DIAGNOSIS — C50912 Malignant neoplasm of unspecified site of left female breast: Secondary | ICD-10-CM

## 2017-09-10 DIAGNOSIS — Z794 Long term (current) use of insulin: Secondary | ICD-10-CM

## 2017-09-10 DIAGNOSIS — N179 Acute kidney failure, unspecified: Secondary | ICD-10-CM

## 2017-09-10 DIAGNOSIS — I25119 Atherosclerotic heart disease of native coronary artery with unspecified angina pectoris: Secondary | ICD-10-CM

## 2017-09-10 NOTE — Progress Notes (Signed)
Location:  [facility] Room Number: 503-P Place of Service:  SNF (31)  Noah Delaine. Sheppard Coil, MD  Patient Care Team: Burnard Bunting, MD as PCP - General (Internal Medicine) Bettina Gavia Hilton Cork, MD as PCP - Cardiology (Cardiology)  Extended Emergency Contact Information Primary Emergency Contact: Ollen Gross, [place] Phone: [phone] Mobile Phone: [phone] Relation: Daughter  Allergies  Allergen Reactions  . Cephalexin Hives  . Ivp Dye [Iodinated Diagnostic Agents] Hives    Chief Complaint  Patient presents with  . Discharge Note    Discharge from [facility]    HPI:  84 y.o. female coronary artery disease status post MI, CVA during cardiac cath for coronary artery disease hypertension, hyperlipidemia, breast cancer status post left lumpectomy in April 2019, diabetes mellitus type 2 on insulin, who presented to the emergency department with nausea vomiting diarrhea abdominal pain shortness of breath and orthopnea.  She also had developed some erythema and redness on her left breast and she had been started on Bactrim for presumed mastitis.  Patient was admitted to [facility] on 5/20-28 for exacerbation of congestive heart failure.  She also developed atrial fib with RVR which was a new finding which was treated with amiodarone drip and controlled she was started on Eliquis for anticoagulation and oral amiodarone.  Was felt that her nausea vomiting was acute gastroenteritis.  She also suffered acute kidney injury secondary to the Lasix required for congestive heart failure but her kidney function was close to baseline at the time of discharge.  Patient was admitted to skilled nursing facility for OT/PT and is now ready to be discharged home.    Past Medical History:  Diagnosis Date  . Altered mental status 04/26/2011  . ANEMIA 03/07/2009   Qualifier: Diagnosis of  By: Burnett Kanaris    .  Arthritis   . Basal cell carcinoma of skin of eyelid, including canthus 10/08/2012  . Breast cancer ([place]) 06/24/2017  . Coronary artery disease   . Depression    after passing of husband   . Diabetes mellitus   . Diverticulosis   . Dyslipidemia 09/23/2012  . GERD (gastroesophageal reflux disease)   . Gout   . Gout   . History of myocardial infarction 09/23/2012  . Hypertension   . IBS (irritable bowel syndrome)   . Malignant neoplasm of upper-outer quadrant of left breast in female, estrogen receptor positive ([place]) 05/22/2017  . Stroke [facility])     Past Surgical History:  Procedure Laterality Date  . ABDOMINAL HYSTERECTOMY    . APPENDECTOMY    . BACK SURGERY    . BREAST LUMPECTOMY WITH RADIOACTIVE SEED AND SENTINEL LYMPH NODE BIOPSY Left 07/08/2017   Procedure: LEFT BREAST LUMPECTOMY WITH RADIOACTIVE SEED AND SENTINEL LYMPH NODE BIOPSY;  Surgeon: Fanny Skates, MD;  Location: [facility];  Service: General;  Laterality: Left;  . cataract surgery    . COLONOSCOPY    . skin cancer removed        reports that she has never smoked. She has never used smokeless tobacco. She reports that she does not drink alcohol or use drugs. Social History   Socioeconomic History  . Marital status: Married    Spouse name: Not on file  . Number of children: Not on file  . Years of education: Not on file  . Highest education level: Not on file  Occupational History  .  Not on file  Social Needs  . Financial resource strain: Not on file  . Food insecurity:    Worry: Not on file    Inability: Not on file  . Transportation needs:    Medical: Not on file    Non-medical: Not on file  Tobacco Use  . Smoking status: Never Smoker  . Smokeless tobacco: Never Used  Substance and Sexual Activity  . Alcohol use: No    Comment: Unable to ascertain due to altered mental status.  . Drug use: No  . Sexual activity: Not on file  Lifestyle  . Physical activity:    Days per week: Not on file    Minutes per  session: Not on file  . Stress: Not on file  Relationships  . Social connections:    Talks on phone: Not on file    Gets together: Not on file    Attends religious service: Not on file    Active member of club or organization: Not on file    Attends meetings of clubs or organizations: Not on file    Relationship status: Not on file  . Intimate partner violence:    Fear of current or ex partner: Not on file    Emotionally abused: Not on file    Physically abused: Not on file    Forced sexual activity: Not on file  Other Topics Concern  . Not on file  Social History Narrative  . Not on file    Pertinent  Health Maintenance Due  Topic Date Due  . FOOT EXAM  10/03/2017 (Originally 10/28/1942)  . OPHTHALMOLOGY EXAM  10/03/2017 (Originally 10/28/1942)  . DEXA SCAN  10/03/2017 (Originally 10/29/1999)  . PNA vac Low Risk Adult (1 of 2 - PCV13) 10/03/2017 (Originally 10[birth year]29/1999)  . INFLUENZA VACCINE  10/31/2017  . HEMOGLOBIN A1C  01/03/2018    Medications: Allergies as of 09/10/2017      Reactions   Cephalexin Hives   Ivp Dye [iodinated Diagnostic Agents] Hives      Medication List        Accurate as of 09/10/17 11:59 PM. Always use your most recent med list.          amiodarone 200 MG tablet Commonly known as:  PACERONE Take 1 tablet (200 mg total) by mouth 2 (two) times daily.   apixaban 2.5 MG Tabs tablet Commonly known as:  ELIQUIS Take 1 tablet (2.5 mg total) by mouth 2 (two) times daily.   aspirin EC 81 MG tablet Take 81 mg by mouth daily.   escitalopram 10 MG tablet Commonly known as:  LEXAPRO Take 10 mg by mouth daily.   HYDROcodone-acetaminophen 10-325 MG tablet Commonly known as:  NORCO Take 1 tablet by mouth every 4 (four) hours as needed. For pain.   insulin aspart 100 UNIT/ML injection Commonly known as:  novoLOG Inject into the skin 3 (three) times daily before meals. Sliding scale   insulin detemir 100 UNIT/ML injection Commonly known as:   LEVEMIR Inject 6 Units into the skin 2 (two) times daily. 12u q am and 6 u q supper   metoprolol tartrate 25 MG tablet Commonly known as:  LOPRESSOR Take 0.5 tablets (12.5 mg total) by mouth 2 (two) times daily.   omeprazole 20 MG capsule Commonly known as:  PRILOSEC Take 20 mg by mouth daily.   polyethylene glycol packet Commonly known as:  MIRALAX / GLYCOLAX Take 17 g by mouth daily as needed for moderate constipation.   simvastatin  40 MG tablet Commonly known as:  ZOCOR Take 1 tablet (40 mg total) by mouth at bedtime.   tamoxifen 20 MG tablet Commonly known as:  NOLVADEX Take 1 tablet (20 mg total) by mouth daily.   telmisartan 20 MG tablet Commonly known as:  MICARDIS Take 1 tablet (20 mg total) by mouth daily.        Vitals:   09/10/17 1531  BP: (!) 143/65  Pulse: 69  Resp: 20  Temp: 98.3 F (36.8 C)  Weight: 144 lb 3.2 oz (65.4 kg)  Height: 5\' 5"  (1.651 m)   Body mass index is 24 kg/m.  Physical Exam  GENERAL APPEARANCE: Alert, conversant. No acute distress.  HEENT: Unremarkable. RESPIRATORY: Breathing is even, unlabored. Lung sounds are clear   CARDIOVASCULAR: Heart RRR no murmurs, rubs or gallops. No peripheral edema.  GASTROINTESTINAL: Abdomen is soft, non-tender, not distended w/ normal bowel sounds.  NEUROLOGIC: Cranial nerves 2-12 grossly intact. Moves all extremities   Labs reviewed: Basic Metabolic Panel: Recent Labs    08/22/17 0313 08/23/17 0303 08/25/17 0316 08/31/17  NA 134* 134* 135 138  K 3.9 4.6 4.4 4.8  CL 94* 94* 96*  --   CO2 28 29 29   --   GLUCOSE 293* 297* 137*  --   BUN 24* 28* 25* 39*  CREATININE 1.47* 1.41* 1.25* 1.6*  CALCIUM 7.7* 8.0* 8.2*  --    No results found for: [facility] Liver Function Tests: Recent Labs    07/04/17 1139 08/19/17 0933  AST 16 26  ALT 7* 12*  ALKPHOS 128* 141*  BILITOT 0.5 0.6  PROT 6.8 7.0  ALBUMIN 3.7 3.8   Recent Labs    08/19/17 0933  LIPASE 17   No results for input(s):  AMMONIA in the last 8760 hours. CBC: Recent Labs    07/04/17 1139  08/20/17 0654 08/21/17 0302 08/23/17 0303  WBC 5.8   < > 9.7 11.9* 9.6  NEUTROABS 3.1  --   --  9.9* 5.5  HGB 10.3*   < > 9.4* 9.8* 11.2*  HCT 32.9*   < > 30.1* 30.8* 34.4*  MCV 95.6   < > 93.2 90.9 90.5  PLT 409*   < > 382 426* 429*   < > = values in this interval not displayed.   Lipid No results for input(s): CHOL, HDL, LDLCALC, TRIG in the last 8760 hours. Cardiac Enzymes: Recent Labs    08/20/17 0020 08/20/17 0654 08/21/17 0302  TROPONINI 0.09* 0.09* 0.06*   BNP: Recent Labs    08/19/17 0933  BNP 409.6*   CBG: Recent Labs    08/26/17 2106 08/27/17 0734 08/27/17 1153  GLUCAP 199* 95 252*    Procedures and Imaging Studies During Stay: Ct Abdomen Pelvis Wo Contrast  Result Date: 08/19/2017 CLINICAL DATA:  Acute abdominal pain. Nausea, vomiting, and diarrhea. History of breast cancer with lumpectomy July 08, 2017. EXAM: CT ABDOMEN AND PELVIS WITHOUT CONTRAST TECHNIQUE: Multidetector CT imaging of the abdomen and pelvis was performed following the standard protocol without IV contrast. COMPARISON:  April 27, 2011 FINDINGS: Lower chest: Skin thickening in the inferior left breast, incompletely evaluated. Small to moderate bilateral pleural effusions. Large hiatal hernia. Mild cardiomegaly. A calcified nodule in the lingula is of no significance. Opacity under the bilateral effusions is likely atelectasis. No suspicious infiltrate identified. Hepatobiliary: No focal liver abnormality is seen. No gallstones, gallbladder wall thickening, or biliary dilatation. Pancreas: Unremarkable. No pancreatic ductal dilatation or surrounding inflammatory changes. Spleen:  Normal in size without focal abnormality. Adrenals/Urinary Tract: Adrenal glands are normal. No renal stones or hydronephrosis. No suspicious masses. The ureters and bladder are normal. Stomach/Bowel: Other than the hiatal hernia, the stomach is normal.  The small bowel is normal. Colonic diverticulosis is seen without diverticulitis. The colon is normal. The appendix is been removed by report. Vascular/Lymphatic: Atherosclerotic changes are seen in the nonaneurysmal tortuous aorta and iliac vessels. No adenopathy. Reproductive: Status post hysterectomy. No adnexal masses. Other: No free air or free fluid. Increased attenuation in the subcutaneous fat diffusely suggests volume overload. Musculoskeletal: Evaluation of the left hip and pelvic bones is limited due to motion. Cortical malalignment in the left pelvic bones on series 4, image 47 is thought to be artifactual due to motion. Recommend clinical correlation. Grade 1 anterolisthesis of L4 versus L5. No fractures or traumatic malalignment. IMPRESSION: 1. No cause for the patient's abdominal symptoms identified. 2. Skin thickening in the inferior left breast is incompletely evaluated. This could be due to recent surgery or radiation. Recommend clinical correlation. Given history, if there is concern for a breast infection, ultrasound could better evaluate. 3. Large hiatal hernia. 4. Mild cardiomegaly, pleural effusions, and subcutaneous fat edema suggests volume overload. 5. Colonic diverticulosis without diverticulitis. 6. Atherosclerotic changes in the aorta and iliac vessels. Electronically Signed   By: Dorise Bullion III M.D   On: 08/19/2017 15:23   Dg Chest Port 1 View  Result Date: 08/20/2017 CLINICAL DATA:  Shortness of breath, chest pain. EXAM: PORTABLE CHEST 1 VIEW COMPARISON:  Radiographs Aug 19, 2017. FINDINGS: Stable cardiomegaly with central pulmonary vascular congestion. Bibasilar interstitial densities are noted concerning for edema and possible mild pleural effusions. No pneumothorax is noted. Bony thorax is unremarkable. IMPRESSION: Stable cardiomegaly with central pulmonary vascular congestion. Probable mild bibasilar pulmonary edema is noted with small pleural effusions. Electronically  Signed   By: Marijo Conception, M.D.   On: 08/20/2017 08:42   Dg Abdomen Acute W/chest  Result Date: 08/19/2017 CLINICAL DATA:  Wheezing, congestion. EXAM: DG ABDOMEN ACUTE W/ 1V CHEST COMPARISON:  Radiographs of June 20, 2011. FINDINGS: Mild cardiomegaly is noted with central pulmonary vascular congestion. Probable mild diffuse bilateral pulmonary edema is noted. No pneumothorax is noted. No significant pleural effusion is noted. No free air is noted to suggest pneumoperitoneum. No abnormal bowel gas pattern is noted. No abnormal calcifications are noted. IMPRESSION: No evidence of bowel obstruction or ileus. Mild cardiomegaly is noted with mild central pulmonary vascular congestion and probable bilateral pulmonary edema. Electronically Signed   By: Marijo Conception, M.D.   On: 08/19/2017 09:29    Assessment/Plan:   Acute hypoxemic respiratory failure (HCC)  Acute on chronic systolic (congestive) heart failure (HCC)  Atrial fibrillation with RVR (HCC)  Gastroenteritis  Acute kidney injury ([place])  Coronary artery disease involving native coronary artery of native heart with angina pectoris ([place])  Hypertensive heart disease with heart failure (HCC)  Hyperlipidemia, unspecified hyperlipidemia type  Malignant neoplasm of left female breast, unspecified estrogen receptor status, unspecified site of breast ([place])  Type 2 diabetes mellitus with diabetic nephropathy, with long-term current use of insulin ([place])   Patient is being discharged with the following home health services: OT/PT/nursing  Patient is being discharged with the following durable medical equipment: None  Patient has been advised to f/u with their PCP in 1-2 weeks to bring them up to date on their rehab stay.  Social services at facility was responsible for arranging this appointment.  Pt was provided with a 30 day supply of prescriptions for medications and refills must be obtained from their PCP.  For controlled substances,  a more limited supply may be provided adequate until PCP appointment only.  Medications have been reconciled.  Time spent greater than 30 minutes;> 50% of time with patient was spent reviewing records, labs, tests and studies, counseling and developing plan of care  Noah Delaine. Sheppard Coil, MD

## 2017-09-11 ENCOUNTER — Encounter: Payer: Self-pay | Admitting: Internal Medicine

## 2017-09-13 ENCOUNTER — Other Ambulatory Visit: Payer: Self-pay | Admitting: Internal Medicine

## 2017-09-14 ENCOUNTER — Encounter: Payer: Self-pay | Admitting: Internal Medicine

## 2017-09-14 DIAGNOSIS — I5023 Acute on chronic systolic (congestive) heart failure: Secondary | ICD-10-CM | POA: Insufficient documentation

## 2017-09-14 DIAGNOSIS — I4891 Unspecified atrial fibrillation: Secondary | ICD-10-CM | POA: Insufficient documentation

## 2017-09-19 DIAGNOSIS — E1151 Type 2 diabetes mellitus with diabetic peripheral angiopathy without gangrene: Secondary | ICD-10-CM | POA: Diagnosis not present

## 2017-09-19 DIAGNOSIS — C50912 Malignant neoplasm of unspecified site of left female breast: Secondary | ICD-10-CM | POA: Diagnosis not present

## 2017-09-19 DIAGNOSIS — I10 Essential (primary) hypertension: Secondary | ICD-10-CM | POA: Diagnosis not present

## 2017-09-19 DIAGNOSIS — W19XXXA Unspecified fall, initial encounter: Secondary | ICD-10-CM | POA: Diagnosis not present

## 2017-09-19 DIAGNOSIS — D72829 Elevated white blood cell count, unspecified: Secondary | ICD-10-CM | POA: Diagnosis not present

## 2017-09-26 DIAGNOSIS — I5042 Chronic combined systolic (congestive) and diastolic (congestive) heart failure: Secondary | ICD-10-CM | POA: Diagnosis not present

## 2017-09-26 DIAGNOSIS — Z8673 Personal history of transient ischemic attack (TIA), and cerebral infarction without residual deficits: Secondary | ICD-10-CM | POA: Diagnosis not present

## 2017-09-26 DIAGNOSIS — I2510 Atherosclerotic heart disease of native coronary artery without angina pectoris: Secondary | ICD-10-CM | POA: Diagnosis not present

## 2017-09-26 DIAGNOSIS — I480 Paroxysmal atrial fibrillation: Secondary | ICD-10-CM | POA: Diagnosis not present

## 2017-09-29 ENCOUNTER — Encounter: Payer: Self-pay | Admitting: Internal Medicine

## 2017-10-11 ENCOUNTER — Other Ambulatory Visit: Payer: Self-pay | Admitting: Internal Medicine

## 2017-10-23 ENCOUNTER — Telehealth: Payer: Self-pay

## 2017-10-23 NOTE — Telephone Encounter (Signed)
Spoke with pt's dtr Mitzi briefly about SCP visit on 11/01/17 (per chart call dtr Mitzi about appt's).  Unsure if her phone went dead or if she hung up on this nurse before able to convey all info.  Unable to reach pt dtr again.

## 2017-10-28 DIAGNOSIS — E1151 Type 2 diabetes mellitus with diabetic peripheral angiopathy without gangrene: Secondary | ICD-10-CM | POA: Diagnosis not present

## 2017-11-01 ENCOUNTER — Encounter: Payer: Medicare Other | Admitting: Adult Health

## 2017-11-06 DIAGNOSIS — H47232 Glaucomatous optic atrophy, left eye: Secondary | ICD-10-CM | POA: Diagnosis not present

## 2017-11-06 DIAGNOSIS — H3522 Other non-diabetic proliferative retinopathy, left eye: Secondary | ICD-10-CM | POA: Diagnosis not present

## 2017-11-06 DIAGNOSIS — H43811 Vitreous degeneration, right eye: Secondary | ICD-10-CM | POA: Diagnosis not present

## 2017-11-06 DIAGNOSIS — H3412 Central retinal artery occlusion, left eye: Secondary | ICD-10-CM | POA: Diagnosis not present

## 2017-11-11 ENCOUNTER — Other Ambulatory Visit: Payer: Self-pay | Admitting: Internal Medicine

## 2017-11-25 ENCOUNTER — Other Ambulatory Visit: Payer: Self-pay | Admitting: Internal Medicine

## 2017-11-28 DIAGNOSIS — E1151 Type 2 diabetes mellitus with diabetic peripheral angiopathy without gangrene: Secondary | ICD-10-CM | POA: Diagnosis not present

## 2017-12-26 DIAGNOSIS — R350 Frequency of micturition: Secondary | ICD-10-CM | POA: Diagnosis not present

## 2017-12-26 DIAGNOSIS — M4800 Spinal stenosis, site unspecified: Secondary | ICD-10-CM | POA: Diagnosis not present

## 2017-12-26 DIAGNOSIS — N390 Urinary tract infection, site not specified: Secondary | ICD-10-CM | POA: Diagnosis not present

## 2017-12-26 DIAGNOSIS — Z79899 Other long term (current) drug therapy: Secondary | ICD-10-CM | POA: Diagnosis not present

## 2018-01-20 DIAGNOSIS — R82998 Other abnormal findings in urine: Secondary | ICD-10-CM | POA: Diagnosis not present

## 2018-01-20 DIAGNOSIS — Z794 Long term (current) use of insulin: Secondary | ICD-10-CM | POA: Diagnosis not present

## 2018-01-20 DIAGNOSIS — I10 Essential (primary) hypertension: Secondary | ICD-10-CM | POA: Diagnosis not present

## 2018-01-20 DIAGNOSIS — Z0000 Encounter for general adult medical examination without abnormal findings: Secondary | ICD-10-CM | POA: Diagnosis not present

## 2018-01-20 DIAGNOSIS — E1151 Type 2 diabetes mellitus with diabetic peripheral angiopathy without gangrene: Secondary | ICD-10-CM | POA: Diagnosis not present

## 2018-02-28 DIAGNOSIS — E1151 Type 2 diabetes mellitus with diabetic peripheral angiopathy without gangrene: Secondary | ICD-10-CM | POA: Diagnosis not present

## 2018-03-20 DIAGNOSIS — M4800 Spinal stenosis, site unspecified: Secondary | ICD-10-CM | POA: Diagnosis not present

## 2018-03-20 DIAGNOSIS — K5903 Drug induced constipation: Secondary | ICD-10-CM | POA: Diagnosis not present

## 2018-03-20 DIAGNOSIS — Z6824 Body mass index (BMI) 24.0-24.9, adult: Secondary | ICD-10-CM | POA: Diagnosis not present

## 2018-03-20 DIAGNOSIS — Z79899 Other long term (current) drug therapy: Secondary | ICD-10-CM | POA: Diagnosis not present

## 2018-03-30 DIAGNOSIS — E1151 Type 2 diabetes mellitus with diabetic peripheral angiopathy without gangrene: Secondary | ICD-10-CM | POA: Diagnosis not present

## 2018-05-14 DIAGNOSIS — Z794 Long term (current) use of insulin: Secondary | ICD-10-CM | POA: Diagnosis not present

## 2018-05-14 DIAGNOSIS — E1151 Type 2 diabetes mellitus with diabetic peripheral angiopathy without gangrene: Secondary | ICD-10-CM | POA: Diagnosis not present

## 2018-05-14 DIAGNOSIS — I10 Essential (primary) hypertension: Secondary | ICD-10-CM | POA: Diagnosis not present

## 2018-05-14 DIAGNOSIS — R0602 Shortness of breath: Secondary | ICD-10-CM | POA: Diagnosis not present

## 2018-05-23 ENCOUNTER — Other Ambulatory Visit (HOSPITAL_COMMUNITY): Payer: Self-pay | Admitting: Internal Medicine

## 2018-05-23 ENCOUNTER — Other Ambulatory Visit: Payer: Self-pay | Admitting: Internal Medicine

## 2018-05-23 DIAGNOSIS — R0602 Shortness of breath: Secondary | ICD-10-CM

## 2018-05-23 DIAGNOSIS — I509 Heart failure, unspecified: Secondary | ICD-10-CM

## 2018-06-01 DEATH — deceased

## 2018-07-06 IMAGING — MG NEEDLE LOCALIZATION OF THE LEFT BREAST WITH MAMMO GUIDANCE
8 series · 8 of 8 positions shown · non-contrast
Comparison: Previous exam(s).

ADDENDUM:
EXAM: BREAST SURGICAL SPECIMEN LEFT
CLINICAL DATA: Localization of a left breast mass

EXAM:
MAMMOGRAPHIC GUIDED RADIOACTIVE SEED LOCALIZATION OF THE LEFT BREAST

[L ML]
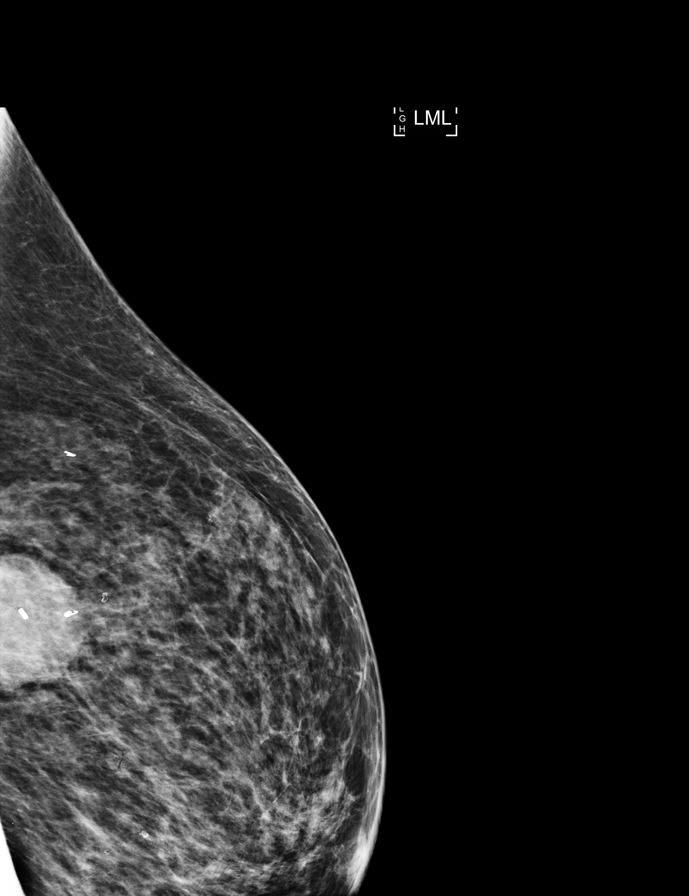

[L CC (1 of 4)]
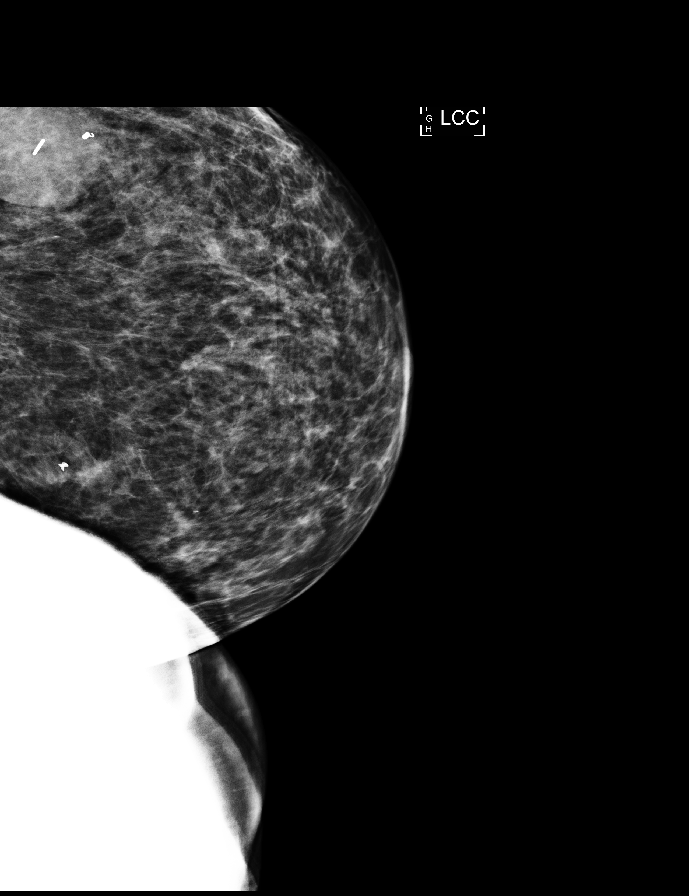

[L LM (1 of 3)]
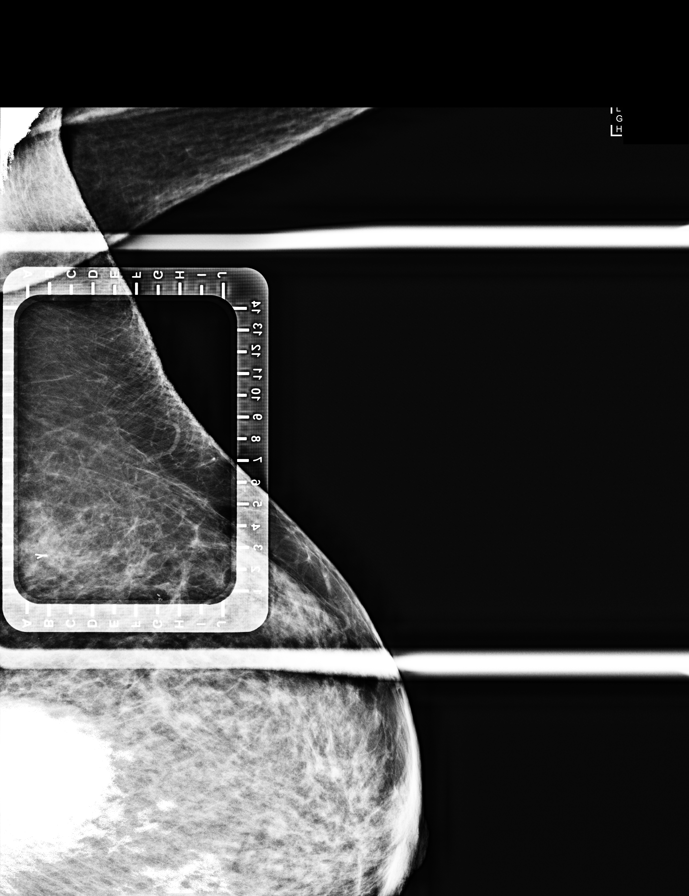

[L CC (2 of 4)]
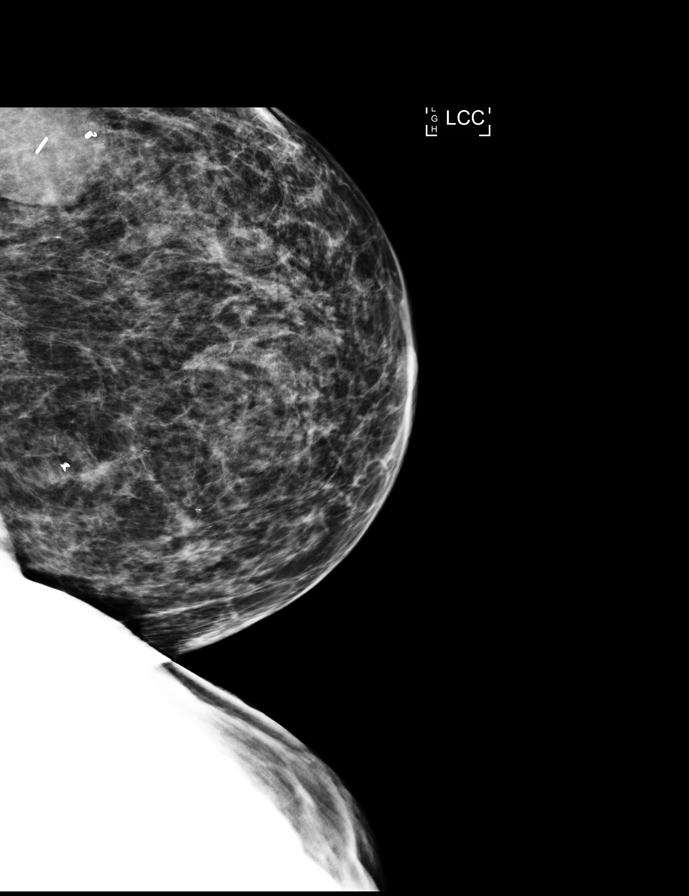

[L LM (2 of 3)]
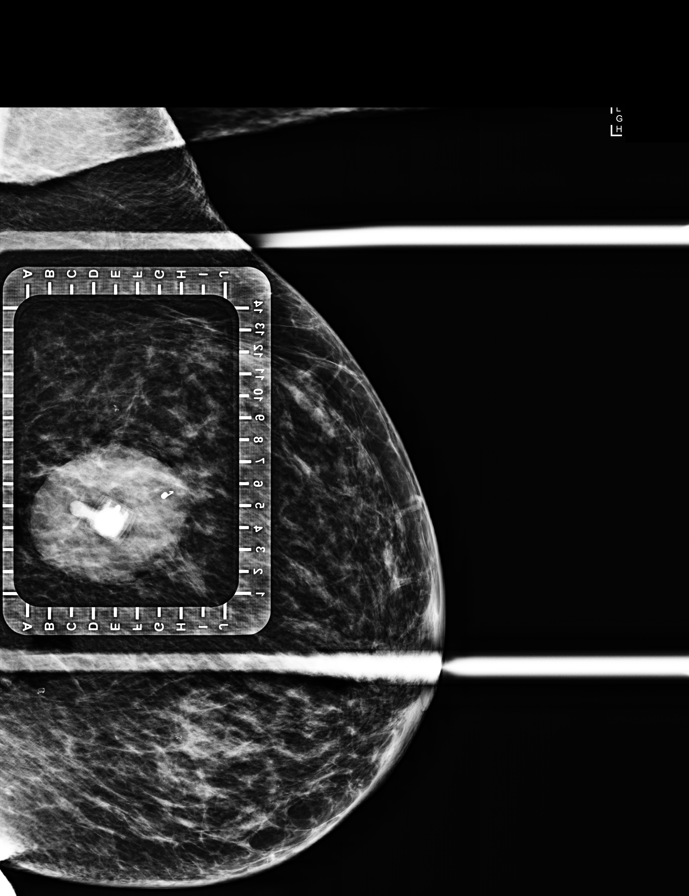

[L CC (3 of 4)]
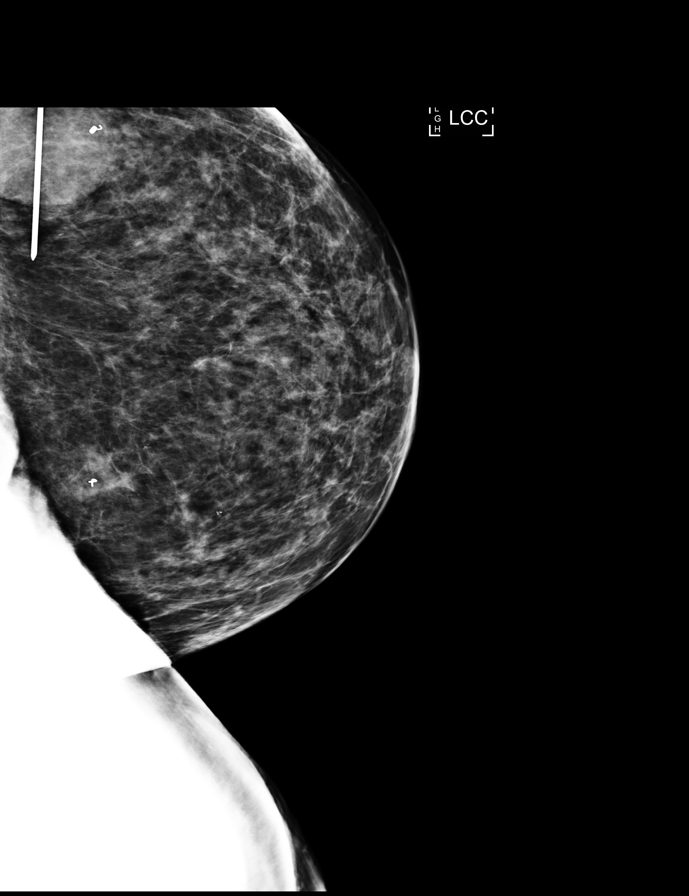

[L LM (3 of 3)]
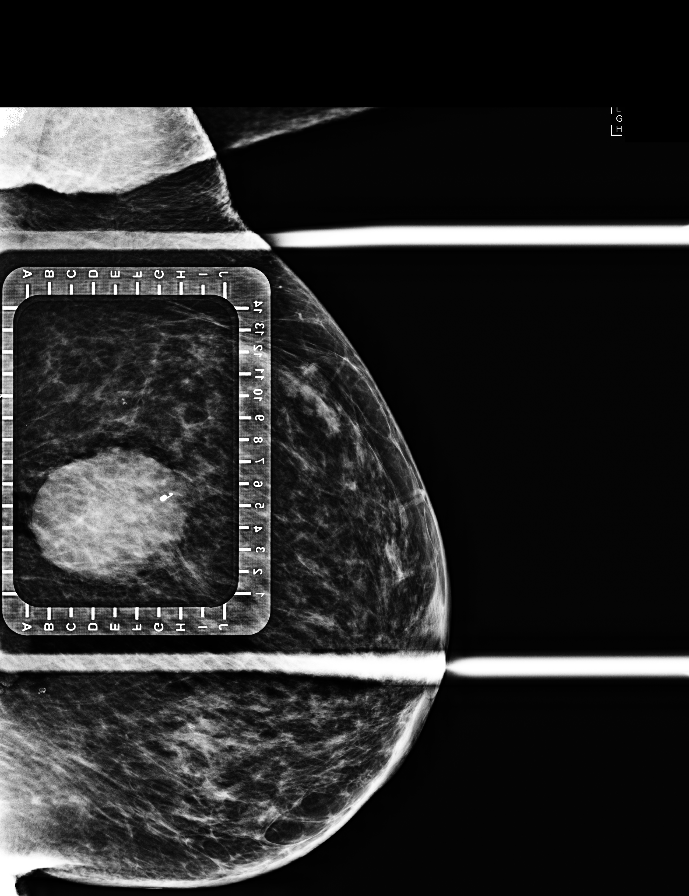

[L CC (4 of 4)]
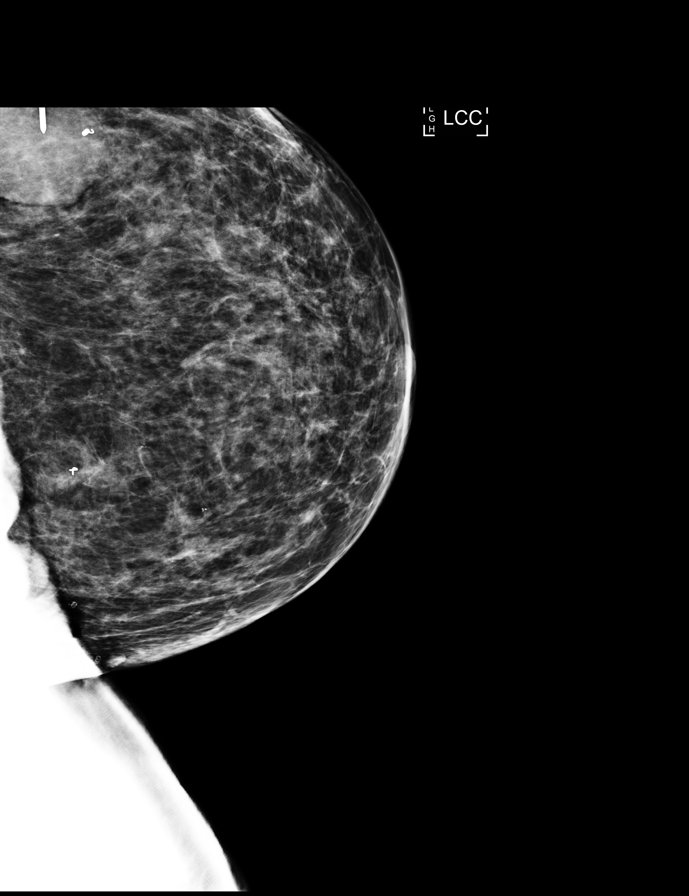

[8 of 8 positions shown; findings below may reference images not displayed]

FINDINGS: The patient is status post seizure of the left breast. The
radioactive seed and coil shaped biopsy clip and mass are present
within the specimen and were marked for pathology.
IMPRESSION: STATUS POST EXCISION OF THE LEFT BREAST WITH RADIOACTIVE SEED, MASS,
AND MARKER CLIP PRESENT.
FINDINGS: Patient presents for radioactive seed localization prior to surgery.
I met with the patient and we discussed the procedure of seed
localization including benefits and alternatives. We discussed the
high likelihood of a successful procedure. We discussed the risks of
the procedure including infection, bleeding, tissue injury and
further surgery. We discussed the low dose of radioactivity involved
in the procedure. Informed, written consent was given.

The usual time-out protocol was performed immediately prior to the
procedure.

Using mammographic guidance, sterile technique, 1% lidocaine and an
Y-TOQ radioactive seed, left breast mass was localized using a
lateral approach. The follow-up mammogram images confirm the seed in
the expected location and were marked for Dr. Hogar.

Follow-up survey of the patient confirms presence of the radioactive
seed.

Order number of Y-TOQ seed:  [id number].

Total activity:  0.248 millicuries reference Date: June 27, 2017

The patient tolerated the procedure well and was released from the
[REDACTED]. She was given instructions regarding seed removal.
IMPRESSION: Radioactive seed localization left breast. No apparent
complications.
